# Patient Record
Sex: Female | Born: 1935 | Race: White | Hispanic: No | Smoking: Never smoker
Health system: Southern US, Community
[De-identification: ages and names within clinical notes are randomized; demographics above are authoritative.]

## PROBLEM LIST (undated history)

## (undated) DIAGNOSIS — E785 Hyperlipidemia, unspecified: Secondary | ICD-10-CM

## (undated) DIAGNOSIS — I482 Chronic atrial fibrillation, unspecified: Secondary | ICD-10-CM

## (undated) DIAGNOSIS — I509 Heart failure, unspecified: Secondary | ICD-10-CM

## (undated) DIAGNOSIS — Z9289 Personal history of other medical treatment: Secondary | ICD-10-CM

## (undated) DIAGNOSIS — N289 Disorder of kidney and ureter, unspecified: Secondary | ICD-10-CM

## (undated) DIAGNOSIS — I639 Cerebral infarction, unspecified: Secondary | ICD-10-CM

## (undated) DIAGNOSIS — M1990 Unspecified osteoarthritis, unspecified site: Secondary | ICD-10-CM

## (undated) DIAGNOSIS — I10 Essential (primary) hypertension: Secondary | ICD-10-CM

## (undated) HISTORY — PX: TONSILLECTOMY: SUR1361

## (undated) HISTORY — DX: Chronic atrial fibrillation, unspecified: I48.20

## (undated) HISTORY — DX: Personal history of other medical treatment: Z92.89

## (undated) HISTORY — DX: Disorder of kidney and ureter, unspecified: N28.9

## (undated) HISTORY — PX: CATARACT EXTRACTION W/ INTRAOCULAR LENS IMPLANT: SHX1309

---

## 1998-06-13 ENCOUNTER — Other Ambulatory Visit: Admission: RE | Admit: 1998-06-13 | Discharge: 1998-06-13 | Payer: Self-pay | Admitting: Internal Medicine

## 1999-06-20 ENCOUNTER — Encounter: Payer: Self-pay | Admitting: Neurology

## 1999-06-20 ENCOUNTER — Inpatient Hospital Stay (HOSPITAL_COMMUNITY): Admission: EM | Admit: 1999-06-20 | Discharge: 1999-06-21 | Payer: Self-pay | Admitting: Emergency Medicine

## 1999-06-20 ENCOUNTER — Encounter: Payer: Self-pay | Admitting: Emergency Medicine

## 2000-08-20 ENCOUNTER — Other Ambulatory Visit: Admission: RE | Admit: 2000-08-20 | Discharge: 2000-08-20 | Payer: Self-pay | Admitting: *Deleted

## 2004-06-08 ENCOUNTER — Ambulatory Visit (HOSPITAL_COMMUNITY): Admission: RE | Admit: 2004-06-08 | Discharge: 2004-06-08 | Payer: Self-pay | Admitting: Gastroenterology

## 2004-06-12 ENCOUNTER — Ambulatory Visit (HOSPITAL_COMMUNITY): Admission: RE | Admit: 2004-06-12 | Discharge: 2004-06-12 | Payer: Self-pay | Admitting: Gastroenterology

## 2009-09-20 DIAGNOSIS — Z9289 Personal history of other medical treatment: Secondary | ICD-10-CM

## 2009-09-20 HISTORY — DX: Personal history of other medical treatment: Z92.89

## 2012-08-26 ENCOUNTER — Observation Stay (HOSPITAL_COMMUNITY)
Admission: EM | Admit: 2012-08-26 | Discharge: 2012-08-27 | Disposition: A | Discharge status: Home / Self Care | Payer: Medicare PPO | Attending: Cardiology | Admitting: Cardiology

## 2012-08-26 ENCOUNTER — Encounter (HOSPITAL_COMMUNITY): Payer: Self-pay | Admitting: Cardiology

## 2012-08-26 ENCOUNTER — Emergency Department (HOSPITAL_COMMUNITY): Payer: Medicare PPO

## 2012-08-26 DIAGNOSIS — I200 Unstable angina: Secondary | ICD-10-CM

## 2012-08-26 DIAGNOSIS — Z7901 Long term (current) use of anticoagulants: Secondary | ICD-10-CM

## 2012-08-26 DIAGNOSIS — R079 Chest pain, unspecified: Secondary | ICD-10-CM | POA: Diagnosis present

## 2012-08-26 DIAGNOSIS — R9439 Abnormal result of other cardiovascular function study: Secondary | ICD-10-CM | POA: Diagnosis present

## 2012-08-26 DIAGNOSIS — R0789 Other chest pain: Principal | ICD-10-CM | POA: Insufficient documentation

## 2012-08-26 DIAGNOSIS — I10 Essential (primary) hypertension: Secondary | ICD-10-CM | POA: Diagnosis present

## 2012-08-26 DIAGNOSIS — E785 Hyperlipidemia, unspecified: Secondary | ICD-10-CM | POA: Insufficient documentation

## 2012-08-26 DIAGNOSIS — K219 Gastro-esophageal reflux disease without esophagitis: Secondary | ICD-10-CM | POA: Diagnosis present

## 2012-08-26 DIAGNOSIS — I4891 Unspecified atrial fibrillation: Secondary | ICD-10-CM | POA: Insufficient documentation

## 2012-08-26 DIAGNOSIS — I2510 Atherosclerotic heart disease of native coronary artery without angina pectoris: Secondary | ICD-10-CM | POA: Insufficient documentation

## 2012-08-26 HISTORY — DX: Essential (primary) hypertension: I10

## 2012-08-26 HISTORY — DX: Hyperlipidemia, unspecified: E78.5

## 2012-08-26 LAB — PROTIME-INR
INR: 1.93 — ABNORMAL HIGH (ref 0.00–1.49)
Prothrombin Time: 21.3 seconds — ABNORMAL HIGH (ref 11.6–15.2)

## 2012-08-26 LAB — POCT I-STAT TROPONIN I: Troponin i, poc: 0 ng/mL (ref 0.00–0.08)

## 2012-08-26 LAB — CBC
Platelets: 299 10*3/uL (ref 150–400)
RBC: 4.1 MIL/uL (ref 3.87–5.11)
RDW: 14.2 % (ref 11.5–15.5)
WBC: 6.5 10*3/uL (ref 4.0–10.5)

## 2012-08-26 LAB — BASIC METABOLIC PANEL
Chloride: 95 mEq/L — ABNORMAL LOW (ref 96–112)
GFR calc Af Amer: >90 mL/min (ref >90)
Potassium: 3.8 mEq/L (ref 3.5–5.1)
Sodium: 133 mEq/L — ABNORMAL LOW (ref 135–145)

## 2012-08-26 LAB — TROPONIN I: Troponin I: <0.3 ng/mL (ref <0.30)

## 2012-08-26 MED ORDER — NITROGLYCERIN 2 % TD OINT: 0.5000 [in_us] | TOPICAL_OINTMENT | Freq: Four times a day (QID) | TRANSDERMAL | Status: DC

## 2012-08-26 MED ORDER — VITAMIN D3 25 MCG (1000 UNIT) PO TABS
1000.0000 [IU] | ORAL_TABLET | Freq: Two times a day (BID) | ORAL | Status: DC
  Administered 2012-08-27: 1000 [IU] via ORAL
  Filled 2012-08-26 (×3): qty 1

## 2012-08-26 MED ORDER — NITROGLYCERIN 0.4 MG SL SUBL: 0.4000 mg | SUBLINGUAL_TABLET | SUBLINGUAL | Status: DC | PRN

## 2012-08-26 MED ORDER — WARFARIN - PHARMACIST DOSING INPATIENT: Freq: Every day | Status: DC

## 2012-08-26 MED ORDER — ONDANSETRON HCL 4 MG/2ML IJ SOLN: 4.0000 mg | Freq: Three times a day (TID) | INTRAMUSCULAR | Status: DC | PRN

## 2012-08-26 MED ORDER — SODIUM CHLORIDE 0.9 % IV SOLN: 250.0000 mL | INTRAVENOUS | Status: DC | PRN

## 2012-08-26 MED ORDER — METOPROLOL TARTRATE 25 MG PO TABS
25.0000 mg | ORAL_TABLET | Freq: Every day | ORAL | Status: DC
  Administered 2012-08-27: 25 mg via ORAL
  Filled 2012-08-26: qty 1

## 2012-08-26 MED ORDER — METOPROLOL-HYDROCHLOROTHIAZIDE 50-25 MG PO TABS: 0.5000 | ORAL_TABLET | Freq: Every day | ORAL | Status: DC

## 2012-08-26 MED ORDER — HYDROCODONE-ACETAMINOPHEN 5-325 MG PO TABS: 1.0000 | ORAL_TABLET | ORAL | Status: DC | PRN

## 2012-08-26 MED ORDER — HYDROCHLOROTHIAZIDE 12.5 MG PO CAPS
12.5000 mg | ORAL_CAPSULE | Freq: Every day | ORAL | Status: DC
  Administered 2012-08-27: 12.5 mg via ORAL
  Filled 2012-08-26: qty 1

## 2012-08-26 MED ORDER — PANTOPRAZOLE SODIUM 40 MG PO TBEC
40.0000 mg | DELAYED_RELEASE_TABLET | Freq: Every day | ORAL | Status: DC
  Administered 2012-08-27: 40 mg via ORAL
  Filled 2012-08-26: qty 1

## 2012-08-26 MED ORDER — ONDANSETRON HCL 4 MG PO TABS: 4.0000 mg | ORAL_TABLET | Freq: Four times a day (QID) | ORAL | Status: DC | PRN

## 2012-08-26 MED ORDER — SODIUM CHLORIDE 0.9 % IJ SOLN: 3.0000 mL | INTRAMUSCULAR | Status: DC | PRN

## 2012-08-26 MED ORDER — ONDANSETRON HCL 4 MG/2ML IJ SOLN: 4.0000 mg | Freq: Four times a day (QID) | INTRAMUSCULAR | Status: DC | PRN

## 2012-08-26 MED ORDER — MORPHINE SULFATE 2 MG/ML IJ SOLN: 2.0000 mg | INTRAMUSCULAR | Status: DC | PRN

## 2012-08-26 MED ORDER — MAGNESIUM CHLORIDE 64 MG PO TBEC
1.0000 | DELAYED_RELEASE_TABLET | Freq: Two times a day (BID) | ORAL | Status: DC
  Administered 2012-08-27 (×2): 64 mg via ORAL
  Filled 2012-08-26 (×3): qty 1

## 2012-08-26 MED ORDER — SODIUM CHLORIDE 0.9 % IJ SOLN
3.0000 mL | Freq: Two times a day (BID) | INTRAMUSCULAR | Status: DC
  Administered 2012-08-27 (×2): 3 mL via INTRAVENOUS

## 2012-08-26 MED ORDER — CALCIUM CARBONATE 1250 (500 CA) MG PO TABS
1.0000 | ORAL_TABLET | Freq: Two times a day (BID) | ORAL | Status: DC
  Administered 2012-08-27: 500 mg via ORAL
  Filled 2012-08-26 (×3): qty 1

## 2012-08-26 MED ORDER — WARFARIN SODIUM 5 MG PO TABS
5.0000 mg | ORAL_TABLET | Freq: Once | ORAL | Status: AC
  Administered 2012-08-27: 5 mg via ORAL
  Filled 2012-08-26: qty 1

## 2012-08-26 NOTE — ED Notes (Signed)
Hospitalist in room with pt. 

## 2012-08-26 NOTE — ED Provider Notes (Signed)
History     CSN: [id number]  Arrival date & time 08/26/12  1555   First MD Initiated Contact with Patient 08/26/12 1930      Chief Complaint  Patient presents with  . Chest Pain     HPI Pt reports left sided chest pain that started around 1300 this afternoon. Denies any n/v or SOB with the pain. States she just had a check up at her PCP office on Friday and was cleared.   Past Medical History  Diagnosis Date  . Hypertension   . Hyperlipemia   . Arrhythmia     Past Surgical History  Procedure Laterality Date  . Tonsillectomy      History reviewed. No pertinent family history.  History  Substance Use Topics  . Smoking status: Never Smoker   . Smokeless tobacco: Not on file  . Alcohol Use: No    OB History   Grav Para Term Preterm Abortions TAB SAB Ect Mult Living                  Review of Systems All other systems reviewed and are negative Allergies  Latex  Home Medications   Current Outpatient Rx  Name  Route  Sig  Dispense  Refill  . calcium carbonate (OS-CAL - DOSED IN MG OF ELEMENTAL CALCIUM) 1250 MG tablet   Oral   Take 1 tablet by mouth 2 (two) times daily.         . cholecalciferol (VITAMIN D) 1000 UNITS tablet   Oral   Take 1,000 Units by mouth 2 (two) times daily.         . fish oil-omega-3 fatty acids 1000 MG capsule   Oral   Take 1 g by mouth every morning.         . magnesium chloride (SLOW-MAG) 64 MG TBEC   Oral   Take 1 tablet by mouth 2 (two) times daily.         . metoprolol-hydrochlorothiazide (LOPRESSOR HCT) 50-25 MG per tablet   Oral   Take 0.5 tablets by mouth daily.         . Multiple Vitamins-Minerals (PRESERVISION AREDS) TABS   Oral   Take 1 tablet by mouth daily.         Marland Kitchen omeprazole (PRILOSEC) 20 MG capsule   Oral   Take 20 mg by mouth daily.         Marland Kitchen warfarin (COUMADIN) 5 MG tablet   Oral   Take 2.5-5 mg by mouth daily. 0.5 tablet 6 days a week; 1 tablet on Monday           BP 149/76   Pulse 89  Temp(Src) 98 F (36.7 C) (Oral)  Resp 16  SpO2 99%  Physical Exam  Nursing note and vitals reviewed. Constitutional: She is oriented to person, place, and time. She appears well-developed and well-nourished. No distress.  HENT:  Head: Normocephalic and atraumatic.  Eyes: Pupils are equal, round, and reactive to light.  Neck: Normal range of motion.  Cardiovascular: Normal rate and intact distal pulses.  An irregular rhythm present.   Date: 08/26/2012  Rate: 83  Rhythm: Atrial fibrillation  QRS Axis: normal  Intervals: normal  ST/T Wave abnormalities: normal  Conduction Disutrbances: none  Narrative Interpretation: Abnormal EKG      Pulmonary/Chest: No respiratory distress.  Abdominal: Normal appearance. She exhibits no distension. There is no tenderness. There is no rebound.  Musculoskeletal: Normal range of motion.  Neurological: She is alert  and oriented to person, place, and time. No cranial nerve deficit.  Skin: Skin is warm and dry. No rash noted.  Psychiatric: She has a normal mood and affect. Her behavior is normal.    ED Course  Procedures (including critical care time) Her cardiologist was consulted.  Requested patient be admitted to internal medicine.  CRITICAL CARE Performed by: Nelia Shi   Total critical care time: 30 min   Critical care time was exclusive of separately billable procedures and treating other patients.  Critical care was necessary to treat or prevent imminent or life-threatening deterioration.  Critical care was time spent personally by me on the following activities: development of treatment plan with patient and/or surrogate as well as nursing, discussions with consultants, evaluation of patient's response to treatment, examination of patient, obtaining history from patient or surrogate, ordering and performing treatments and interventions, ordering and review of laboratory studies, ordering and review of radiographic  studies, pulse oximetry and re-evaluation of patient's condition.  Labs Reviewed  CBC - Abnormal; Notable for the following:    Hemoglobin 11.8 (*)    HCT 34.9 (*)    All other components within normal limits  BASIC METABOLIC PANEL - Abnormal; Notable for the following:    Sodium 133 (*)    Chloride 95 (*)    Glucose, Bld 101 (*)    GFR calc non Af Amer 83 (*)    All other components within normal limits  PROTIME-INR  POCT I-STAT TROPONIN I   Dg Chest 2 View  08/26/2012  *RADIOLOGY REPORT*  Clinical Data: Chest pain.  CHEST - 2 VIEW  Comparison: None.  Findings: There is evidence of probable COPD.  No infiltrate, pneumothorax, consolidation, nodule or pleural fluid is seen. Heart size is within normal limits.  Bony thorax shows osteopenia as well as significant scoliosis of the thoracolumbar spine.  IMPRESSION: Probable COPD.  No acute findings.   Original Report Authenticated By: Irish Lack, M.D.      1. Unstable angina       MDM          Nelia Shi, MD 08/26/12 2056

## 2012-08-26 NOTE — Progress Notes (Signed)
ANTICOAGULATION CONSULT NOTE - Initial Consult  Pharmacy Consult for Coumadin Indication: atrial fibrillation  Allergies  Allergen Reactions  . Latex Itching    Patient Measurements: Height: 5\' 1"  (154.9 cm) Weight: 109 lb 12.6 oz (49.8 kg) IBW/kg (Calculated) : 47.8  Vital Signs: Temp: 97.9 F (36.6 C) (02/25 2218) Temp src: Oral (02/25 2218) BP: 144/84 mmHg (02/25 2218) Pulse Rate: 88 (02/25 2218)  Labs:  Recent Labs  08/26/12 1605 08/26/12 1956  HGB 11.8*  --   HCT 34.9*  --   PLT 299  --   LABPROT  --  21.3*  INR  --  1.93*  CREATININE 0.56  --     Estimated Creatinine Clearance: 38.8 ml/min (by C-G formula based on Cr of 0.56).   Medical History: Past Medical History  Diagnosis Date  . Hypertension   . Hyperlipemia   . Arrhythmia     Medications:  Prescriptions prior to admission  Medication Sig Dispense Refill  . calcium carbonate (OS-CAL - DOSED IN MG OF ELEMENTAL CALCIUM) 1250 MG tablet Take 1 tablet by mouth 2 (two) times daily.      . cholecalciferol (VITAMIN D) 1000 UNITS tablet Take 1,000 Units by mouth 2 (two) times daily.      . fish oil-omega-3 fatty acids 1000 MG capsule Take 1 g by mouth every morning.      . magnesium chloride (SLOW-MAG) 64 MG TBEC Take 1 tablet by mouth 2 (two) times daily.      . metoprolol-hydrochlorothiazide (LOPRESSOR HCT) 50-25 MG per tablet Take 0.5 tablets by mouth daily.      . Multiple Vitamins-Minerals (PRESERVISION AREDS) TABS Take 1 tablet by mouth daily.      Marland Kitchen omeprazole (PRILOSEC) 20 MG capsule Take 20 mg by mouth daily.      Marland Kitchen warfarin (COUMADIN) 5 MG tablet Take 2.5-5 mg by mouth daily. 0.5 tablet 6 days a week; 1 tablet on Monday        Assessment: 79 yo female admitted with chest pain, h/o Afib, to continue anticoagulation  Goal of Therapy:  INR 2-3 Monitor platelets by anticoagulation protocol: Yes   Plan:  Coumadin 5 mg tonight Daily INR  Maxxwell Edgett, Gary Fleet 08/26/2012,11:18 PM

## 2012-08-26 NOTE — H&P (Signed)
[facility] hospitalist  Date: 08/26/2012               Patient Name:  Diamond Thomas MRN: [id number]  DOB: 1925/05/09 Age / Sex: 79 y.o., female   PCP: No primary provider on file.                Chief Complaint: Chest pain  History of Present Illness: Patient is a 79 y.o. female with a PMHx of atrial fibrillation, hypertension, hyperlipidemia, coronary artery disease and reflux disease, who presents to [facility] for evaluation of acute onset chest pain that began 3 hours prior to presenting to [facility]. Patient was resting at that time. Pain is described as a sharp chest pain located over mid chest. The pain is rated as a 5/10 in severity at onset and currently rated 0/10. The pain is notably aggravated by nothing. It is alleviated by none. denies associated diaphoresis, nausea, near syncope, palpitations, shortness of breath and syncope. For the pain, the patient tried nothing for pain relief.  The patient has not experienced similar symptoms previously. Secondary to her constellation of symptoms, Diamond Thomas presented to [facility] for further evaluation. Otherwise, the patient denies diaphoresis, nausea, near syncope, palpitations, shortness of breath and syncope.   Current Outpatient Medications:  (Not in a hospital admission)  Allergies: Allergies  Allergen Reactions  . Latex Itching     Past Medical History: Past Medical History  Diagnosis Date  . Hypertension   . Hyperlipemia   . Arrhythmia     Past Surgical History: Past Surgical History  Procedure Laterality Date  . Tonsillectomy      Family History: History reviewed. No pertinent family history.  Social History: History   Social History  . Marital Status: Married    Spouse Name: N/A    Number of Children: N/A  . Years of Education: N/A   Occupational History  . Not on file.   Social History Main Topics  . Smoking status: Never Smoker   . Smokeless tobacco: Not on file  . Alcohol Use: No  . Drug Use: No  .  Sexually Active: Not on file   Other Topics Concern  . Not on file   Social History Narrative  . No narrative on file    Review of Systems: Constitutional:  denies fever, chills, diaphoresis, appetite change and fatigue.  HEENT: denies photophobia, eye pain, redness, hearing loss, ear pain, congestion, sore throat, rhinorrhea, sneezing, neck pain, neck stiffness and tinnitus.  Respiratory: denies SOB, DOE, cough, chest tightness, and wheezing.  Cardiovascular: denies chest pain, palpitations and leg swelling.  Gastrointestinal: denies nausea, vomiting, abdominal pain, diarrhea, constipation, blood in stool.  Genitourinary: denies dysuria, urgency, frequency, hematuria, flank pain and difficulty urinating.  Musculoskeletal: denies  myalgias, back pain, joint swelling, arthralgias and gait problem.   Skin: denies pallor, rash and wound.  Neurological: denies dizziness, seizures, syncope, weakness, light-headedness, numbness and headaches.   Hematological: denies adenopathy, easy bruising, personal or family bleeding history.  Psychiatric/ Behavioral: denies suicidal ideation, mood changes, confusion, nervousness, sleep disturbance and agitation.    Vital Signs: Blood pressure 128/66, pulse 90, temperature 98 F (36.7 C), temperature source Oral, resp. rate 19, SpO2 96.00%.  Physical Exam: General: Vital signs reviewed and noted. Well-developed, well-nourished, in no acute distress; alert, appropriate and cooperative throughout examination.  Head: Normocephalic, atraumatic.  Eyes: PERRL, EOMI, No signs of anemia or jaundince.  Nose: Mucous membranes moist, not inflammed, nonerythematous.  Throat: Oropharynx nonerythematous, no exudate appreciated.  Neck: No deformities, masses, or tenderness noted.Supple, No carotid Bruits, no JVD.  Lungs:  Normal respiratory effort. Clear to auscultation BL without crackles or wheezes.  Heart:  irregularly irregular rhythm. S1 and S2 normal without  gallop, murmur, or rubs.  Abdomen:  BS normoactive. Soft, Nondistended, non-tender.  No masses or organomegaly.  Extremities: No pretibial edema.  Neurologic: A&O X3, CN II - XII are grossly intact. Motor strength is 5/5 in the all 4 extremities, Sensations intact to light touch, Cerebellar signs negative.  Skin: No visible rashes, scars.    Lab results: Basic Metabolic Panel:  Recent Labs  08/65/78 1605  NA 133*  K 3.8  CL 95*  CO2 29  GLUCOSE 101*  BUN 19  CREATININE 0.56  CALCIUM 9.5   CBC:  Recent Labs  08/26/12 1605  WBC 6.5  HGB 11.8*  HCT 34.9*  MCV 85.1  PLT 299   Coagulation:  Recent Labs  08/26/12 1956  LABPROT 21.3*  INR 1.93*    Misc. Labs: Negative troponins so far   Imaging results:  Dg Chest 2 View  08/26/2012  *RADIOLOGY REPORT*  Clinical Data: Chest pain.  CHEST - 2 VIEW  Comparison: None.  Findings: There is evidence of probable COPD.  No infiltrate, pneumothorax, consolidation, nodule or pleural fluid is seen. Heart size is within normal limits.  Bony thorax shows osteopenia as well as significant scoliosis of the thoracolumbar spine.  IMPRESSION: Probable COPD.  No acute findings.   Original Report Authenticated By: Irish Lack, M.D.      Other results:  EKG (08/26/2012) - Atrial Fibrillation, irregular rate of approximately 83 bpm, normal axis, ST segments: normal.    Last Echo not present in the chart   Last Cath not present in the chart    Assessment & Plan: Pt is a 79 y.o. yo female with a PMHX of hypertension, hyperlipidemia, atrial fibrillation and reflux disease, who presented to [facility] on 08/26/2012 with symptoms of mid chest chest pain, with EKG showing irregularly irregular rhythm and initial troponins of 0.00.    Atypical chest pain -the pain was persistent for about 3 hours prior to patient being presenting to ER, no exacerbating or relieving factors and disappeared with minimal therapy. Pain is atypical in character  but given her previous coronary artery disease there is a risk of this being cardiac in origin.    Admission EKG showed atrial fibrillation, and cardiac enzymes have been negative so for. atheterization has been performed in the past but there are no records in Epic. Dr. Herbie Baltimore who is the primary cardiologist was informed by ER who suggested a hospitalist admission and he agreed to see the patient in the morning.   Admit to telemetry  Continue to cycle cardiac enzymes.  Continue aspirin, beta blocker, statin therapy.  Continue morphine, oxygen, PRN nitroglycerin.  Continue treatment with her regular medications for other medical problems including Coumadin per pharmacy. Patient's atrial fibrillation is rate controlled at this time and I will continue beta blocker.    DVT PPX - full dose Coumadin CODE STATUS - full   Signed: Lars Mage, MD  Pager [phone]  08/26/2012, 9:45 PM   Open [email or web address] for coverage details

## 2012-08-26 NOTE — ED Notes (Signed)
Paged Dr. Herbie Baltimore to [phone]

## 2012-08-26 NOTE — ED Notes (Signed)
Pt reports left sided chest pain that started around 1300 this afternoon. Denies any n/v or SOB with the pain. States she just had a check up at her PCP office on Friday and was cleared.

## 2012-08-27 ENCOUNTER — Encounter (HOSPITAL_COMMUNITY): Payer: Self-pay | Admitting: *Deleted

## 2012-08-27 DIAGNOSIS — Z7901 Long term (current) use of anticoagulants: Secondary | ICD-10-CM

## 2012-08-27 LAB — CBC
HCT: 34.2 % — ABNORMAL LOW (ref 36.0–46.0)
MCHC: 33.6 g/dL (ref 30.0–36.0)
RDW: 14.2 % (ref 11.5–15.5)

## 2012-08-27 LAB — BASIC METABOLIC PANEL
BUN: 17 mg/dL (ref 6–23)
GFR calc Af Amer: >90 mL/min (ref >90)
GFR calc non Af Amer: 83 mL/min — ABNORMAL LOW (ref >90)
Potassium: 3.9 mEq/L (ref 3.5–5.1)

## 2012-08-27 LAB — TROPONIN I
Troponin I: <0.3 ng/mL (ref <0.30)
Troponin I: <0.3 ng/mL (ref <0.30)

## 2012-08-27 MED ORDER — WARFARIN SODIUM 2.5 MG PO TABS
2.5000 mg | ORAL_TABLET | Freq: Once | ORAL | Status: DC
  Filled 2012-08-27: qty 1

## 2012-08-27 MED ORDER — NITROGLYCERIN 0.4 MG SL SUBL: 0.4000 mg | SUBLINGUAL_TABLET | SUBLINGUAL | Status: DC | PRN

## 2012-08-27 MED ORDER — ACETAMINOPHEN 325 MG PO TABS
650.0000 mg | ORAL_TABLET | Freq: Four times a day (QID) | ORAL | Status: DC | PRN
  Administered 2012-08-27: 650 mg via ORAL
  Filled 2012-08-27: qty 2

## 2012-08-27 NOTE — H&P (Signed)
I have seen and evaluated the patient this AM along with Boyce Medici, PA. I agree with her findings & examination as well as impression recommendations.  The patient is well-known to me from recent clinic visit. A pleasant 79 year old woman with a history of abnormal Myoview in 2011 does never had any symptoms of angina since. I think the stress test was done to evaluate cause of A. Fib which is also not had much of since 2011. In the past she's been very reluctant to have any invasive evaluations done, and I have been in agreement with this strategy.  She comes in after having a prolonged episode of chest discomfort which she described as sharp stabbing pain on the left side of her chest radiating out toward the shoulder. It occurred shortly after eating lunch. It pretty much lasted throughout the evening, was not exacerbated by mopping her floor vigorously for 30 minutes. It sort of was there is some worsening moments but usually just there. Symptoms vitamins been gone since about 2:00 in the morning. Has not had any further symptoms since. So far she's ruled out with 2 sets of cardiac cardiac enzymes negative, final levels pending.  She is on warfarin for atrial fibrillation her INR is just barely subtherapeutic,, still too high to consider an invasive procedure at this time. Her exam is essentially benign with the exception of having some reproducible discomfort left costal margin. Place this the scope of this area elicited a sharp pain which was similar to her pain yesterday.  My overall assessment of the situation as it exists discomfort episode was very unlikely cardiac in nature for several reasons: The duration of time that lasted, the nature the pain being sharp and stabbing, and the fact it is not exacerbated by her mopping the floor for 30 minutes. She had no associated symptoms of nausea diaphoresis or shortness of breath. He is currently resting comfortably. She would like to go home  today  My plan for today is to have her angulated in the hall with nursing to see if she has any reproducible pain similarly yesterday. I then want to monitor her after having lunch as this is the timeframe of when she had her symptom yesterday. We'll then ambulate her againlater in the afternoon.  If she remains stable throughout this process, my clinic to be to discharge her home with sublingual nitroglycerin glycerin when necessary but continue her current medications. Overall suspicion is that this is probably muscular skeletal chest pain the reproducibility of her chest discomfort with palpation. Would recommend to simply use of Tylenol for this.  I would like her to follow back up again with one of our transportation is in a couple weeks to make sure ensurerecurrence of symptoms. As she has not been in favor of the invasive evaluation in the past, repeating a stress test as not likely to help anything because of previous Racz abnormal in the absence of no symptoms. I am in favor of a less is best approach which has been hers and her family's intention since I have known her.  Marykay Lex, M.D., M.S. [facility] [address] [place], [place]  [address]  [phone] Pager # [phone] 08/27/2012 11:29 AM

## 2012-08-27 NOTE — Discharge Summary (Signed)
Physician Discharge Summary  Patient ID: Diamond Thomas MRN: [id number] DOB/AGE: 02/17/1925 79 y.o.  Admit date: 08/26/2012 Discharge date: 08/27/2012  Admission Diagnoses: Chest Pain  Discharge Diagnoses:  Principal Problem:   Chest pain at rest Active Problems:   Hypertension   HLD (hyperlipidemia)   Coronary artery disease - High risk NST 08/2009, pt refused diagnostic heart cath, EF >55% by echo 08/2009   Atrial fibrillation - rate controlled    Acid reflux disease   Chronic anticoagulation - with warfarin   Discharged Condition: stable  Hospital Course: The patient is an 79 y/o female who was admitted on 2/25, by [facility], for chest pain. Her primary cardiologist is Dr. Herbie Baltimore. Her history is significant for longstanding atrial fibrillation, rate controlled on metoprolol and anticoagulated with warfarin. Her warfarin is being followed by Dr. Nicholos Johns. She underwent nuclear stress testing in March of 2011 and had a high risk scan. There was a medium sized moderate intensity perfusion defect in the basal inferior-mid inferior wall with moderate reversibility. The patient however had been asymptomatic and did not wish to pursue coronary angiography or intervention at that time. An Echo in March 2011 showed an EF greater than 55% with normal wall motion, bilateral mid left atrial dilation and mild aortic sclerosis. She had last seen Dr. Herbie Baltimore in clinic for routine follow-up recently on 08/22/12, and at that time had no issues or complaints. She had presented to [facility] with a complaint of atypical chest pain and was admitted for cardiac evaluation. She ruled out for MI with negative troponin's x 3. Serial EKGs showed atrial fibrillation, that was rate controlled, but showed no acute changes. A CXR was also obtained and was normal.  On physical examination, the patient had reproducible chest pain with palpation of the chest wall.  Essentially, her chest pain had resolved with no medical  therapy, over the course of several hours. Dr. Herbie Baltimore saw her in consult and felt that her pain was not cardiac related and perhaps musculoskeletal. However, the patient was kept and monitored for several additional hours. She was ordered to ambulate with nursing assistance to see if there was any recurrence of symptoms. The patient denied further chest pain. Dr Herbie Baltimore, felt that she was stable for discharge home. She will continue with her home medical regimen. She was given a prescription for PRN SL NTG. Follow-up has been arranged with Nada Boozer, Advanced Practitioner , on 09/10/12 at [facility].   Consults: None  Significant Diagnostic Studies:   Serial EKGs: Atrial fibrillation, Vent. Rate in the 90s, no acute changes    Cardiac Panel (last 3 results)  Recent Labs  08/26/12 2304 08/27/12 0430 08/27/12 1200  TROPONINI <0.30 <0.30 <0.30    CXR: *RADIOLOGY REPORT*  Clinical Data: Chest pain.  CHEST - 2 VIEW  Comparison: None.  Findings: There is evidence of probable COPD. No infiltrate,  pneumothorax, consolidation, nodule or pleural fluid is seen.  Heart size is within normal limits. Bony thorax shows osteopenia  as well as significant scoliosis of the thoracolumbar spine.  IMPRESSION:  Probable COPD. No acute findings.  Original Report Authenticated By: Irish Lack, M.D.     Treatments: See Hospital Course  Discharge Exam: Blood pressure 108/59, pulse 87, temperature 98.1 F (36.7 C), temperature source Oral, resp. rate 17, height 5\' 1"  (1.549 m), weight 109 lb 12.6 oz (49.8 kg), SpO2 98.00%.   Disposition:   Discharge Orders   Future Orders Complete By Expires     Diet -  low sodium heart healthy  As directed     Increase activity slowly  As directed         Medication List    TAKE these medications       calcium carbonate 1250 MG tablet  Commonly known as:  OS-CAL - dosed in mg of elemental calcium  Take 1 tablet by mouth 2 (two) times daily.      cholecalciferol 1000 UNITS tablet  Commonly known as:  VITAMIN D  Take 1,000 Units by mouth 2 (two) times daily.     fish oil-omega-3 fatty acids 1000 MG capsule  Take 1 g by mouth every morning.     magnesium chloride 64 MG Tbec  Commonly known as:  SLOW-MAG  Take 1 tablet by mouth 2 (two) times daily.     metoprolol-hydrochlorothiazide 50-25 MG per tablet  Commonly known as:  LOPRESSOR HCT  Take 0.5 tablets by mouth daily.     nitroGLYCERIN 0.4 MG SL tablet  Commonly known as:  NITROSTAT  Place 1 tablet (0.4 mg total) under the tongue every 5 (five) minutes as needed for chest pain.     omeprazole 20 MG capsule  Commonly known as:  PRILOSEC  Take 20 mg by mouth daily.     PRESERVISION AREDS Tabs  Take 1 tablet by mouth daily.     warfarin 5 MG tablet  Commonly known as:  COUMADIN  Take 2.5-5 mg by mouth daily. 0.5 tablet 6 days a week; 1 tablet on Monday           Follow-up Information   Follow up with [facility] R, NP On 09/10/2012. (10:00 am)    Contact information:   [address] [place] [address] [phone]       Signed: Allayne Butcher, PA-C 08/27/2012, 2:08 PM

## 2012-08-27 NOTE — Care Management Note (Signed)
    Page 1 of 1   08/27/2012     3:23:20 PM   CARE MANAGEMENT NOTE 08/27/2012  Patient:  TANEAL, SONNTAG   Account Number:  [id number]  Date Initiated:  08/27/2012  Documentation initiated by:  Latiffany Harwick  Subjective/Objective Assessment:   PT ADM WITH CHEST PAIN ON 08/26/12.  HX OF SEV FALLS OVER THE PAST YEAR.  PTA, PT LIVES AT HOME ALONE AND IS INDEPENDENT.  SHE AMBULATES WITH A CANE.     Action/Plan:   MET WITH PT TO DISCUSS DC NEEDS.  PT STATES SON AND DAUGHTER ASSIST AS NEEDED.  SHE DECLINED MY OFFER OF HHPT SAFTEY EVAL AT HOME DUE TO HX OF FALLS.  SHE HAS RW AND 3 PRONGED CANE AT HOME.   Anticipated DC Date:  08/27/2012   Anticipated DC Plan:  HOME/SELF CARE      DC Planning Services  CM consult      Choice offered to / List presented to:             Status of service:  Completed, signed off Medicare Important Message given?   (If response is "NO", the following Medicare IM given date fields will be blank) Date Medicare IM given:   Date Additional Medicare IM given:    Discharge Disposition:  HOME/SELF CARE  Per UR Regulation:  Reviewed for med. necessity/level of care/duration of stay  If discussed at Long Length of Stay Meetings, dates discussed:    Comments:

## 2012-08-27 NOTE — Progress Notes (Signed)
Utilization Review Completed.Heru Montz T2/26/2014  

## 2012-08-27 NOTE — Progress Notes (Signed)
ANTICOAGULATION CONSULT NOTE - Follow Up Consult  Pharmacy Consult for Coumadin Indication: atrial fibrillation  Allergies  Allergen Reactions  . Latex Itching    Patient Measurements: Height: 5\' 1"  (154.9 cm) Weight: 109 lb 12.6 oz (49.8 kg) IBW/kg (Calculated) : 47.8  Vital Signs: Temp: 98.1 F (36.7 C) (02/26 0342) Temp src: Oral (02/26 0342) BP: 108/59 mmHg (02/26 1020) Pulse Rate: 87 (02/26 1020)  Labs:  Recent Labs  08/26/12 1605 08/26/12 1956 08/26/12 2304 08/27/12 0430  HGB 11.8*  --   --  11.5*  HCT 34.9*  --   --  34.2*  PLT 299  --   --  291  LABPROT  --  21.3*  --  21.0*  INR  --  1.93*  --  1.89*  CREATININE 0.56  --   --  0.56  TROPONINI  --   --  <0.30 <0.30    Estimated Creatinine Clearance: 38.8 ml/min (by C-G formula based on Cr of 0.56).  Assessment:   INR just below target range.   Home Coumadin regimen:  2.5 mg daily except 5 mg on Mondays.  (Has 5 mg tablets.)   2/25 dose not taken before coming to ED, given at 1am today. Dose was increased to 5 mg x 1 due to low INR.  May go home later today.  She thinks her usual INR check is due next week.  Goal of Therapy:  INR 2-3 Monitor platelets by anticoagulation protocol: Yes   Plan:   Coumadin 2.5 mg tonight.  PT/INR in am if not discharged.  Dennie Fetters, RPh Pager: [phone] 08/27/2012,12:27 PM

## 2012-08-27 NOTE — H&P (Signed)
Reason for Consult:Chest Pain Referring Physician: Lars Mage, MD ([facility]) Primary Cardiologist: Dr. Herbie Baltimore   HPI: Diamond Thomas is an 79 y/o female who has been followed at [facility] by Dr. Herbie Baltimore. Diamond Thomas, Diamond controlled on metoprolol and anticoagulated with warfarin. Diamond warfarin is being followed by Dr. Nicholos Johns. She underwent nuclear stress testing in March of 2011 and had a high risk scan. There was a medium sized moderate intensity perfusion defect in the basal inferior-mid inferior wall with moderate reversibility. The patient however had been asymptomatic and did not wish to pursue coronary angiography or intervention at that time. An Echo in March 2011 showed an EF greater than 55% with normal wall motion, bilateral mid left atrial dilation and mild aortic sclerosis. She presented to [facility] on 2/25 with a chief complaint of chest pain.  She reports that Diamond chest pain started 1 day ago after eating lunch, which consisted of a peanut butter sandwich and coffee. She denies any chest pain prior to yesterday's episode. The discomfort started approximately 1 hour after eating. She first thought it was indigestion. She endorses burping, which provided minimal relief. The pain was sharp and substernal. Acute onset at rest and constant. 5/10 maximum intensity. Non-radiating, non-positional, non-pleuritic and non-exertional. No alleviating or aggravating factors. No associated symptoms. She denies SOB, diaphoresis, n/v, palpitations, lightheadedness/dizzinesss, syncope/presyncope. No orthopnea/PND or LEE. She did not take any thing at home for it. After 2 hours, she reports that the discomfort eased slightly but did not fully go way. It was at that point that Diamond son brought Diamond to the ED for evaluation. She was admitted by [facility] and Dr. Herbie Baltimore was notified. Two EKGs have shown no acute changes. She had a normal CXR and  troponins were negative x 2. She has not received any NTG. She currently denies any CP.  Past Medical History  Diagnosis Date  . Hypertension   . Hyperlipemia   . Arrhythmia     Past Surgical History  Procedure Laterality Date  . Tonsillectomy      History reviewed. No pertinent family history.  Social History:  reports that she has never smoked. She does not have any smokeless tobacco history on file. She reports that she does not drink alcohol or use illicit drugs.  Allergies:  Allergies  Allergen Reactions  . Latex Itching    Medications:  Prior to Admission:  Prescriptions prior to admission  Medication Sig Dispense Refill  . calcium carbonate (OS-CAL - DOSED IN MG OF ELEMENTAL CALCIUM) 1250 MG tablet Take 1 tablet by mouth 2 (two) times daily.      . cholecalciferol (VITAMIN D) 1000 UNITS tablet Take 1,000 Units by mouth 2 (two) times daily.      . fish oil-omega-3 fatty acids 1000 MG capsule Take 1 g by mouth every morning.      . magnesium chloride (SLOW-MAG) 64 MG TBEC Take 1 tablet by mouth 2 (two) times daily.      . metoprolol-hydrochlorothiazide (LOPRESSOR HCT) 50-25 MG per tablet Take 0.5 tablets by mouth daily.      . Multiple Vitamins-Minerals (PRESERVISION AREDS) TABS Take 1 tablet by mouth daily.      Marland Kitchen omeprazole (PRILOSEC) 20 MG capsule Take 20 mg by mouth daily.      Marland Kitchen warfarin (COUMADIN) 5 MG tablet Take 2.5-5 mg by mouth daily. 0.5 tablet 6 days a week; 1 tablet on Monday  Results for orders placed during the hospital encounter of 08/26/12 (from the past 48 hour(s))  CBC     Status: Abnormal   Collection Time    08/26/12  4:05 PM      Result Value Range   WBC 6.5  4.0 - 10.5 K/uL   RBC 4.10  3.87 - 5.11 MIL/uL   Hemoglobin 11.8 (*) 12.0 - 15.0 g/dL   HCT 40.9 (*) 81.1 - 91.4 %   MCV 85.1  78.0 - 100.0 fL   MCH 28.8  26.0 - 34.0 pg   MCHC 33.8  30.0 - 36.0 g/dL   RDW 78.2  95.6 - 21.3 %   Platelets 299  150 - 400 K/uL  BASIC METABOLIC  PANEL     Status: Abnormal   Collection Time    08/26/12  4:05 PM      Result Value Range   Sodium 133 (*) 135 - 145 mEq/L   Potassium 3.8  3.5 - 5.1 mEq/L   Chloride 95 (*) 96 - 112 mEq/L   CO2 29  19 - 32 mEq/L   Glucose, Bld 101 (*) 70 - 99 mg/dL   BUN 19  6 - 23 mg/dL   Creatinine, Ser 0.86  0.50 - 1.10 mg/dL   Calcium 9.5  8.4 - 57.8 mg/dL   GFR calc non Af Amer 83 (*) >90 mL/min   GFR calc Af Amer >90  >90 mL/min   Comment:            The eGFR has been calculated     using the CKD EPI equation.     This calculation has not been     validated in all clinical     situations.     eGFR's persistently     <90 mL/min signify     possible Chronic Kidney Disease.  POCT I-STAT TROPONIN I     Status: None   Collection Time    08/26/12  4:23 PM      Result Value Range   Troponin i, poc 0.00  0.00 - 0.08 ng/mL   Comment 3            Comment: Due to the release kinetics of cTnI,     a negative result within the first hours     of the onset of symptoms does not rule out     myocardial infarction with certainty.     If myocardial infarction is still suspected,     repeat the test at appropriate intervals.  PROTIME-INR     Status: Abnormal   Collection Time    08/26/12  7:56 PM      Result Value Range   Prothrombin Time 21.3 (*) 11.6 - 15.2 seconds   INR 1.93 (*) 0.00 - 1.49  TROPONIN I     Status: None   Collection Time    08/26/12 11:04 PM      Result Value Range   Troponin I <0.30  <0.30 ng/mL   Comment:            Due to the release kinetics of cTnI,     a negative result within the first hours     of the onset of symptoms does not rule out     myocardial infarction with certainty.     If myocardial infarction is still suspected,     repeat the test at appropriate intervals.  TROPONIN I     Status: None   Collection  Time    08/27/12  4:30 AM      Result Value Range   Troponin I <0.30  <0.30 ng/mL   Comment:            Due to the release kinetics of cTnI,     a  negative result within the first hours     of the onset of symptoms does not rule out     myocardial infarction with certainty.     If myocardial infarction is still suspected,     repeat the test at appropriate intervals.  CBC     Status: Abnormal   Collection Time    08/27/12  4:30 AM      Result Value Range   WBC 9.0  4.0 - 10.5 K/uL   RBC 4.02  3.87 - 5.11 MIL/uL   Hemoglobin 11.5 (*) 12.0 - 15.0 g/dL   HCT 30.8 (*) 65.7 - 84.6 %   MCV 85.1  78.0 - 100.0 fL   MCH 28.6  26.0 - 34.0 pg   MCHC 33.6  30.0 - 36.0 g/dL   RDW 96.2  95.2 - 84.1 %   Platelets 291  150 - 400 K/uL  BASIC METABOLIC PANEL     Status: Abnormal   Collection Time    08/27/12  4:30 AM      Result Value Range   Sodium 134 (*) 135 - 145 mEq/L   Potassium 3.9  3.5 - 5.1 mEq/L   Chloride 96  96 - 112 mEq/L   CO2 30  19 - 32 mEq/L   Glucose, Bld 88  70 - 99 mg/dL   BUN 17  6 - 23 mg/dL   Creatinine, Ser 3.24  0.50 - 1.10 mg/dL   Calcium 9.7  8.4 - 40.1 mg/dL   GFR calc non Af Amer 83 (*) >90 mL/min   GFR calc Af Amer >90  >90 mL/min   Comment:            The eGFR has been calculated     using the CKD EPI equation.     This calculation has not been     validated in all clinical     situations.     eGFR's persistently     <90 mL/min signify     possible Chronic Kidney Disease.  PROTIME-INR     Status: Abnormal   Collection Time    08/27/12  4:30 AM      Result Value Range   Prothrombin Time 21.0 (*) 11.6 - 15.2 seconds   INR 1.89 (*) 0.00 - 1.49    Dg Chest 2 View  08/26/2012  *RADIOLOGY REPORT*  Clinical Data: Chest pain.  CHEST - 2 VIEW  Comparison: None.  Findings: There is evidence of probable COPD.  No infiltrate, pneumothorax, consolidation, nodule or pleural fluid is seen. Heart size is within normal limits.  Bony thorax shows osteopenia as well as significant scoliosis of the thoracolumbar spine.  IMPRESSION: Probable COPD.  No acute findings.   Original Report Authenticated By: Irish Lack,  M.D.     Review of Systems  Constitutional: Negative for diaphoresis.  HENT: Negative for congestion, sore throat and neck pain.   Respiratory: Negative for cough, hemoptysis, shortness of breath, wheezing and stridor.   Cardiovascular: Positive for chest pain. Negative for palpitations, orthopnea, claudication, leg swelling and PND.  Gastrointestinal: Negative for heartburn, nausea, vomiting, abdominal pain, diarrhea, constipation, blood in stool and melena.  Genitourinary: Negative for hematuria.  Musculoskeletal: Negative for back pain.  Neurological: Negative for dizziness and loss of consciousness.   Blood pressure 133/73, pulse 85, temperature 98.1 F (36.7 C), temperature source Oral, resp. Diamond 17, height 5\' 1"  (1.549 m), weight 109 lb 12.6 oz (49.8 kg), SpO2 98.00%. Physical Exam  Constitutional: She is oriented to person, place, and time. She appears well-developed. No distress.  HENT:  Head: Normocephalic and atraumatic.  Eyes: Conjunctivae and EOM are normal. Pupils are equal, round, and reactive to light.  Neck: Normal range of motion. Neck supple. Normal carotid pulses and no JVD present. Carotid bruit is not present. No thyromegaly present.  Cardiovascular: Intact distal pulses.  An irregularly irregular rhythm present. Exam reveals no gallop and no friction rub.   No murmur heard. Pulses:      Carotid pulses are 2+ on the right side, and 2+ on the left side.      Radial pulses are 2+ on the right side, and 2+ on the left side.       Dorsalis pedis pulses are 2+ on the right side, and 2+ on the left side.  Respiratory: Effort normal and breath sounds normal. No stridor. No respiratory distress. She has no wheezes. She has no rales.  GI: Soft. Bowel sounds are normal. She exhibits no distension and no mass. There is no tenderness.  Musculoskeletal: She exhibits no edema.  Lymphadenopathy:    She has no cervical adenopathy.  Neurological: She is alert and oriented to  person, place, and time.  Skin: Skin is warm and dry. She is not diaphoretic.  Psychiatric: She has a normal mood and affect. Diamond behavior is normal.    Assessment/Plan: Principal Problem:   Chest pain at rest Active Problems:   Hypertension   HLD (hyperlipidemia)   Coronary artery disease - High risk NST 08/2009, pt refused diagnostic heart cath, EF >55% by echo 08/2009   Atrial Thomas - Diamond controlled    Acid reflux disease   Chronic anticoagulation - with warfarin  Plan: Two EKGs have been obtained, both showing atrial Thomas (Diamond controlled) w/ no acute changes. CXR was normal.  Troponin negative x 2. Awaiting last set. Pt denies further chest pain. Diamond symptoms seem to be atypical, however, she is female and over the age of 51. These two groups tend to present with more atypical chest pain. Pt had a high risk NST in March of 2011 and did not wish to undergo diagnostic heart catheterization/ intervention at that time. She is not on daily ASA. She has HLD, but is not on a statin (not interested in any statin therapy and intolerant to pravastatin). Considering Diamond abnormal NST in the past, as well as risk factors (HTN, HLD) more aggressive medical therapy may need to be considered. The patient is 79 y/o so the risk vs benefit of diagnostic cath needs to be taken in consideration. Will await final troponin level. Will continue to monitor. INR is also slightly sub therapeutic. Recommended therapeutic level for a-fib is 2.0-3.0. INR is 1.89. Pharmacy is now following and will manage warfarin.  MD to follow with further recommendation.   Allayne Butcher, PA-C 08/27/2012, 9:45 AM

## 2012-08-27 NOTE — Progress Notes (Signed)
Unable to obtain PT evaluation today. NCM spoke with patient and patient informed that she has three pronged cane at home as well as walker and feels she does not need at this time. Spoke to [place] PA-c regarding PT consult; okay to cancel d/t recent walk with RN. Mamie Levers

## 2012-08-27 NOTE — Discharge Summary (Signed)
I don not think that her symptoms were cardiac in nature.  She has r/o MI & is ambulating without recurrent Sx - arguing against obstructive CAD as the etiology of her sx.  Ok for d/c today.  Marykay Lex, M.D., M.S. [facility] [address] [place], [place]  [address]  [phone] Pager # [phone] 08/27/2012 3:24 PM

## 2012-08-27 NOTE — Progress Notes (Signed)
Ambulated 277ft  with patient using cane. Denies chest pain or shortness of breath. Patient stopped occasionally d/t being off balanced. Returned to bed w/out incidence. Call bell near. Diamond Thomas

## 2012-08-28 NOTE — Progress Notes (Signed)
Discharge instructions along with medlist and script provided. Patient transported via wheelchair to lobby for discharge home. Diamond Thomas

## 2013-03-16 ENCOUNTER — Encounter: Payer: Self-pay | Admitting: Cardiology

## 2013-03-23 ENCOUNTER — Ambulatory Visit (INDEPENDENT_AMBULATORY_CARE_PROVIDER_SITE_OTHER): Payer: Medicare PPO | Admitting: Cardiology

## 2013-03-23 ENCOUNTER — Encounter: Payer: Self-pay | Admitting: Cardiology

## 2013-03-23 VITALS — BP 130/82 | Ht 63.0 in | Wt 113.6 lb

## 2013-03-23 DIAGNOSIS — I2510 Atherosclerotic heart disease of native coronary artery without angina pectoris: Secondary | ICD-10-CM

## 2013-03-23 DIAGNOSIS — E785 Hyperlipidemia, unspecified: Secondary | ICD-10-CM | POA: Insufficient documentation

## 2013-03-23 DIAGNOSIS — I10 Essential (primary) hypertension: Secondary | ICD-10-CM

## 2013-03-23 DIAGNOSIS — I4891 Unspecified atrial fibrillation: Secondary | ICD-10-CM

## 2013-03-23 DIAGNOSIS — R079 Chest pain, unspecified: Secondary | ICD-10-CM

## 2013-03-23 DIAGNOSIS — I482 Chronic atrial fibrillation, unspecified: Secondary | ICD-10-CM | POA: Insufficient documentation

## 2013-03-23 NOTE — Progress Notes (Signed)
PCP: RAMACHANDRAN,AJITH, MD  Clinic Note: Chief Complaint  Patient presents with  . 6 month visit    no chest pain, no sob little if busy , little bit swelling    HPI: Diamond Thomas is a 79 y.o. female with a PMH below who presents today for followup of atrial fibrillation.  She was admitted for overnight observation in the hospital back in February on the 26th 2 this is shortly after I saw her.  She had an episode of significant chest discomfort at rest which resolved.  The symptoms are quite atypical and likely nonanginal in nature.  She's not had any more since.  The pain was reproducible with palpation the chest wall.    Interval History: She presents today relatively free of any significant symptoms.  She does not acknowledge or note any about her being in atrial fibrillation.  She denies any palpitations or rapid heart beats.  She says if she overexerts herself, she would get short of breath but otherwise denies any chest tightness pressure or short of breath with rest or exertion.  She does have occasional mild dependent edema.  She denies any lightheadedness, dizziness or wooziness.  No subcutaneous symptoms, TIA or amaurosis fugax symptoms. She also denies any melena, hematochezia or hematuria.  No significant nosebleeds.  She'll denies any claudication symptoms.  She is relatively active but does not do any routine exercise.  Past Medical History  Diagnosis Date  . Hyperlipemia   . Hypertension   . Chronic atrial fibrillation     controlled on metoprolol and anticoagulated with Coumadin(Dr Ramachandran) .  Marland Kitchen H/O echocardiogram 09/20/2009    EF greater than 55%, no wall motion, bilateral atrial dilation.  Mild aortic sclerosis with no stenosis  . History of nuclear stress test 09/20/2009    Myoview : meduim sized, moderate intensity perfusion defect in the basal inferior wall with moderate reversibility thought to be High Risk scan; deferred invasive evaluation based on  patient wishes.    Prior Cardiac Evaluation and Past Surgical History: Past Surgical History  Procedure Laterality Date  . Tonsillectomy      Allergies  Allergen Reactions  . Latex Itching  . Pravachol [Pravastatin Sodium] Other (See Comments)    Myalgias    Current Outpatient Prescriptions  Medication Sig Dispense Refill  . calcium carbonate (OS-CAL - DOSED IN MG OF ELEMENTAL CALCIUM) 1250 MG tablet Take 1 tablet by mouth 2 (two) times daily.      . cholecalciferol (VITAMIN D) 1000 UNITS tablet Take 1,000 Units by mouth 2 (two) times daily.      . clobetasol cream (TEMOVATE) 0.05 %       . fish oil-omega-3 fatty acids 1000 MG capsule Take 1 g by mouth every morning.      . magnesium chloride (SLOW-MAG) 64 MG TBEC Take 1 tablet by mouth 2 (two) times daily.      . metoprolol-hydrochlorothiazide (LOPRESSOR HCT) 50-25 MG per tablet Take 0.5 tablets by mouth daily.      . Multiple Vitamins-Minerals (PRESERVISION AREDS) TABS Take 1 tablet by mouth daily.      . nitroGLYCERIN (NITROSTAT) 0.4 MG SL tablet Place 1 tablet (0.4 mg total) under the tongue every 5 (five) minutes as needed for chest pain.  25 tablet  5  . omeprazole (PRILOSEC) 20 MG capsule Take 20 mg by mouth daily.      Marland Kitchen Propylene Glycol (SYSTANE BALANCE OP) Apply to eye as needed.      Marland Kitchen  traMADol (ULTRAM) 50 MG tablet       . warfarin (COUMADIN) 5 MG tablet Take 2.5-5 mg by mouth daily. 0.5 tablet 6 days a week; 1 tablet on Monday       No current facility-administered medications for this visit.    History   Social History Narrative   She widowed ,2 children ,1 grandchild . 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 No tobacco use . No alcohol use. No chewing tobacco     ROS: A comprehensive Review of Systems - Negative with the exception of mild positives noted above in history of present illness.  PHYSICAL EXAM BP 130/82  Ht 5\' 3"  (1.6 m)  Wt 113 lb 9.6 oz (51.529 kg)  BMI 20.13 kg/m2 General  appearance: alert, cooperative, appears stated age, no distress and Very thin, but overall healthy appearing.  Answers questions appropriately A&O x 3 Neck: no adenopathy, no carotid bruit, no JVD, supple, symmetrical, trachea midline and thyroid not enlarged, symmetric, no tenderness/mass/nodules Lungs: clear to auscultation bilaterally, normal percussion bilaterally and Nonlabored, good air movement Heart: irregularly irregular rhythm, no S3 or S4, systolic murmur: systolic ejection 1/6, low pitch, crescendo and decrescendo at 2nd right intercostal space, no click and no rub Abdomen: soft, non-tender; bowel sounds normal; no masses,  no organomegaly Extremities: extremities normal, atraumatic, no cyanosis or edema and venous stasis dermatitis noted Pulses: 2+ and symmetric Neurologic: Grossly normal HEENT: [place]/AT, EOMI, MMM, anicteric sclera  [id number]:[id number] today: Yes Rate:74 , Rhythm: Atrial Fibrillation; Non Specific ST-T abnormalities  Recent Labs: None  ASSESSMENT / PLAN: Atrial fibrillation - rate controlled  Early asymptomatic, rate controlled on moderate dose beta blocker.  Anticoagulated on warfarin.    Coronary artery disease - High risk NST 08/2009, pt refused diagnostic heart cath, EF >55% by echo 08/2009 Despite having high risk Myoview stress test in the past, she's not had any symptoms consistent with angina.  She does not wish any invasive procedures.  For medical management she is on beta blocker. She is now on aspirin due to being on warfarin.  Hypertension Well controlled on combination of metoprolol and HCTZ  Hyperlipemia Monitored by primary physician.  Currently on omega-3 fatty acids.  Will defer management to primary care provider, as she is reluctant to use statin therapy.  She was intolerant of Pravachol in the past.   No orders of the defined types were placed in this encounter.    Followup: One year  DAVID W. Herbie Baltimore, M.D., M.S. [facility] [address] [place], [place]  [address]  [phone] Pager # [phone]

## 2013-03-23 NOTE — Assessment & Plan Note (Signed)
Well controlled on combination of metoprolol and HCTZ

## 2013-03-23 NOTE — Assessment & Plan Note (Signed)
Despite having high risk Myoview stress test in the past, she's not had any symptoms consistent with angina.  She does not wish any invasive procedures.  For medical management she is on beta blocker. She is now on aspirin due to being on warfarin.

## 2013-03-23 NOTE — Patient Instructions (Signed)
Your physician wants you to follow-up in 12 month Dr Herbie Baltimore.  You will receive a reminder letter in the mail two months in advance. If you don't receive a letter, please call our office to schedule the follow-up appointment.  KEEP your feet up for swelling.  continue current medication

## 2013-03-23 NOTE — Assessment & Plan Note (Signed)
Early asymptomatic, rate controlled on moderate dose beta blocker.  Anticoagulated on warfarin.

## 2013-03-23 NOTE — Assessment & Plan Note (Signed)
Monitored by primary physician.  Currently on omega-3 fatty acids.  Will defer management to primary care provider, as she is reluctant to use statin therapy.  She was intolerant of Pravachol in the past.

## 2013-04-30 ENCOUNTER — Encounter: Payer: Self-pay | Admitting: Cardiology

## 2013-07-25 ENCOUNTER — Emergency Department (HOSPITAL_COMMUNITY): Payer: Medicare PPO

## 2013-07-25 ENCOUNTER — Emergency Department (HOSPITAL_COMMUNITY)
Admission: EM | Admit: 2013-07-25 | Discharge: 2013-07-25 | Disposition: A | Discharge status: Home / Self Care | Payer: Medicare PPO | Attending: Emergency Medicine | Admitting: Emergency Medicine

## 2013-07-25 ENCOUNTER — Encounter (HOSPITAL_COMMUNITY): Payer: Self-pay | Admitting: Emergency Medicine

## 2013-07-25 DIAGNOSIS — S8990XA Unspecified injury of unspecified lower leg, initial encounter: Secondary | ICD-10-CM | POA: Insufficient documentation

## 2013-07-25 DIAGNOSIS — I10 Essential (primary) hypertension: Secondary | ICD-10-CM | POA: Insufficient documentation

## 2013-07-25 DIAGNOSIS — S4980XA Other specified injuries of shoulder and upper arm, unspecified arm, initial encounter: Secondary | ICD-10-CM | POA: Insufficient documentation

## 2013-07-25 DIAGNOSIS — I4891 Unspecified atrial fibrillation: Secondary | ICD-10-CM | POA: Insufficient documentation

## 2013-07-25 DIAGNOSIS — Y9289 Other specified places as the place of occurrence of the external cause: Secondary | ICD-10-CM | POA: Insufficient documentation

## 2013-07-25 DIAGNOSIS — Z862 Personal history of diseases of the blood and blood-forming organs and certain disorders involving the immune mechanism: Secondary | ICD-10-CM | POA: Insufficient documentation

## 2013-07-25 DIAGNOSIS — S99919A Unspecified injury of unspecified ankle, initial encounter: Secondary | ICD-10-CM

## 2013-07-25 DIAGNOSIS — Z79899 Other long term (current) drug therapy: Secondary | ICD-10-CM | POA: Insufficient documentation

## 2013-07-25 DIAGNOSIS — R040 Epistaxis: Secondary | ICD-10-CM | POA: Insufficient documentation

## 2013-07-25 DIAGNOSIS — S199XXA Unspecified injury of neck, initial encounter: Secondary | ICD-10-CM

## 2013-07-25 DIAGNOSIS — S99929A Unspecified injury of unspecified foot, initial encounter: Secondary | ICD-10-CM

## 2013-07-25 DIAGNOSIS — W19XXXA Unspecified fall, initial encounter: Secondary | ICD-10-CM

## 2013-07-25 DIAGNOSIS — Y939 Activity, unspecified: Secondary | ICD-10-CM | POA: Insufficient documentation

## 2013-07-25 DIAGNOSIS — W1809XA Striking against other object with subsequent fall, initial encounter: Secondary | ICD-10-CM | POA: Insufficient documentation

## 2013-07-25 DIAGNOSIS — S46909A Unspecified injury of unspecified muscle, fascia and tendon at shoulder and upper arm level, unspecified arm, initial encounter: Secondary | ICD-10-CM | POA: Insufficient documentation

## 2013-07-25 DIAGNOSIS — S0993XA Unspecified injury of face, initial encounter: Secondary | ICD-10-CM | POA: Insufficient documentation

## 2013-07-25 DIAGNOSIS — Z7901 Long term (current) use of anticoagulants: Secondary | ICD-10-CM | POA: Insufficient documentation

## 2013-07-25 DIAGNOSIS — W010XXA Fall on same level from slipping, tripping and stumbling without subsequent striking against object, initial encounter: Secondary | ICD-10-CM | POA: Insufficient documentation

## 2013-07-25 DIAGNOSIS — Z8639 Personal history of other endocrine, nutritional and metabolic disease: Secondary | ICD-10-CM | POA: Insufficient documentation

## 2013-07-25 DIAGNOSIS — S0990XA Unspecified injury of head, initial encounter: Secondary | ICD-10-CM | POA: Insufficient documentation

## 2013-07-25 NOTE — Discharge Instructions (Signed)

## 2013-07-25 NOTE — ED Notes (Signed)
Patient transported to CT 

## 2013-07-25 NOTE — ED Notes (Signed)
Pt states she got tripped up and fell striking her R shoulder and face in shrubbery.  Pt states she had a nosebleed and placed cotton in her L nare.  Bleeding controlled.  Pt denies LOC.  Pt is on Coumadin.

## 2013-07-25 NOTE — ED Provider Notes (Signed)
CSN: [id number]     Arrival date & time 07/25/13  1229 History   First MD Initiated Contact with Patient 07/25/13 1304     Chief Complaint  Patient presents with  . Fall  . Epistaxis  . Shoulder Pain   (Consider location/radiation/quality/duration/timing/severity/associated sxs/prior Treatment) HPI Comments: Patient presents to the emergency department with chief complaint of fall. She states that she tripped over a root, and fell into some bushes. She reports striking her right shoulder and face in the bushes. She complains of mild to moderate pain in her head neck, as well as her right shoulder, and right knee. She denies any loss of consciousness. She states that following that incident she did have a bloody nose, but this has since resolved. She takes Coumadin for A. fib. No other complaints at this time.  The history is provided by the patient. No language interpreter was used.    Past Medical History  Diagnosis Date  . Hyperlipemia   . Hypertension   . Chronic atrial fibrillation     controlled on metoprolol and anticoagulated with Coumadin(Dr Ramachandran) .  Marland Kitchen H/O echocardiogram 09/20/2009    EF greater than 55%, no wall motion, bilateral atrial dilation.  Mild aortic sclerosis with no stenosis  . History of nuclear stress test 09/20/2009    Myoview : meduim sized, moderate intensity perfusion defect in the basal inferior wall with moderate reversibility thought to be High Risk scan; deferred invasive evaluation based on patient wishes.   Past Surgical History  Procedure Laterality Date  . Tonsillectomy     No family history on file. History  Substance Use Topics  . Smoking status: Never Smoker   . Smokeless tobacco: Not on file  . Alcohol Use: No   OB History   Grav Para Term Preterm Abortions TAB SAB Ect Mult Living                 Review of Systems  All other systems reviewed and are negative.    Allergies  Latex and Pravachol  Home Medications   Current  Outpatient Rx  Name  Route  Sig  Dispense  Refill  . calcium carbonate (OS-CAL - DOSED IN MG OF ELEMENTAL CALCIUM) 1250 MG tablet   Oral   Take 1 tablet by mouth 2 (two) times daily.         . cholecalciferol (VITAMIN D) 1000 UNITS tablet   Oral   Take 1,000 Units by mouth 2 (two) times daily.         . clobetasol cream (TEMOVATE) 0.05 %               . fish oil-omega-3 fatty acids 1000 MG capsule   Oral   Take 1 g by mouth every morning.         . magnesium chloride (SLOW-MAG) 64 MG TBEC   Oral   Take 1 tablet by mouth 2 (two) times daily.         . metoprolol-hydrochlorothiazide (LOPRESSOR HCT) 50-25 MG per tablet   Oral   Take 0.5 tablets by mouth daily.         . Multiple Vitamins-Minerals (PRESERVISION AREDS) TABS   Oral   Take 1 tablet by mouth 2 (two) times daily.          Marland Kitchen omeprazole (PRILOSEC) 20 MG capsule   Oral   Take 20 mg by mouth daily.         Marland Kitchen Propylene Glycol (SYSTANE BALANCE OP)  Ophthalmic   Apply to eye as needed.         . traMADol (ULTRAM) 50 MG tablet               . warfarin (COUMADIN) 5 MG tablet   Oral   Take 2.5-5 mg by mouth daily. 0.5 tablet 5 days a week; 1 tablet on Monday and Friday         . nitroGLYCERIN (NITROSTAT) 0.4 MG SL tablet   Sublingual   Place 1 tablet (0.4 mg total) under the tongue every 5 (five) minutes as needed for chest pain.   25 tablet   5    BP 150/84  Pulse 88  Temp(Src) 97.6 F (36.4 C)  Resp 17  Ht 5' 1.5" (1.562 m)  Wt 113 lb (51.256 kg)  BMI 21.01 kg/m2  SpO2 98% Physical Exam  Nursing note and vitals reviewed. Constitutional: She is oriented to person, place, and time. She appears well-developed and well-nourished.  HENT:  Head: Normocephalic and atraumatic.  Eyes: Conjunctivae and EOM are normal. Pupils are equal, round, and reactive to light.  Neck: Normal range of motion. Neck supple.  Mild tenderness to palpation of cervical spine, no step-offs or deformities   Cardiovascular: Normal rate and regular rhythm.  Exam reveals no gallop and no friction rub.   No murmur heard. Pulmonary/Chest: Effort normal and breath sounds normal. No respiratory distress. She has no wheezes. She has no rales. She exhibits no tenderness.  Abdominal: Soft. Bowel sounds are normal. She exhibits no distension and no mass. There is no tenderness. There is no rebound and no guarding.  Musculoskeletal: Normal range of motion. She exhibits no edema and no tenderness.  Moves all extremities, bilateral upper and lower extremities full range of motion and strength  Neurological: She is alert and oriented to person, place, and time.  CN 3-12 intact, sensation and strength intact throughout  Skin: Skin is warm and dry.  Psychiatric: She has a normal mood and affect. Her behavior is normal. Judgment and thought content normal.    ED Course  Procedures (including critical care time) Results for orders placed during the hospital encounter of 08/26/12  CBC      Result Value Range   WBC 6.5  4.0 - 10.5 K/uL   RBC 4.10  3.87 - 5.11 MIL/uL   Hemoglobin 11.8 (*) 12.0 - 15.0 g/dL   HCT 16.134.9 (*) 09.636.0 - 04.546.0 %   MCV 85.1  78.0 - 100.0 fL   MCH 28.8  26.0 - 34.0 pg   MCHC 33.8  30.0 - 36.0 g/dL   RDW 40.914.2  81.111.5 - 91.415.5 %   Platelets 299  150 - 400 K/uL  BASIC METABOLIC PANEL      Result Value Range   Sodium 133 (*) 135 - 145 mEq/L   Potassium 3.8  3.5 - 5.1 mEq/L   Chloride 95 (*) 96 - 112 mEq/L   CO2 29  19 - 32 mEq/L   Glucose, Bld 101 (*) 70 - 99 mg/dL   BUN 19  6 - 23 mg/dL   Creatinine, Ser 7.820.56  0.50 - 1.10 mg/dL   Calcium 9.5  8.4 - 95.610.5 mg/dL   GFR calc non Af Amer 83 (*) >90 mL/min   GFR calc Af Amer >90  >90 mL/min  PROTIME-INR      Result Value Range   Prothrombin Time 21.3 (*) 11.6 - 15.2 seconds   INR 1.93 (*) 0.00 - 1.49  TROPONIN I      Result Value Range   Troponin I <0.30  <0.30 ng/mL  TROPONIN I      Result Value Range   Troponin I <0.30  <0.30 ng/mL   CBC      Result Value Range   WBC 9.0  4.0 - 10.5 K/uL   RBC 4.02  3.87 - 5.11 MIL/uL   Hemoglobin 11.5 (*) 12.0 - 15.0 g/dL   HCT 16.1 (*) 09.6 - 04.5 %   MCV 85.1  78.0 - 100.0 fL   MCH 28.6  26.0 - 34.0 pg   MCHC 33.6  30.0 - 36.0 g/dL   RDW 40.9  81.1 - 91.4 %   Platelets 291  150 - 400 K/uL  BASIC METABOLIC PANEL      Result Value Range   Sodium 134 (*) 135 - 145 mEq/L   Potassium 3.9  3.5 - 5.1 mEq/L   Chloride 96  96 - 112 mEq/L   CO2 30  19 - 32 mEq/L   Glucose, Bld 88  70 - 99 mg/dL   BUN 17  6 - 23 mg/dL   Creatinine, Ser 7.82  0.50 - 1.10 mg/dL   Calcium 9.7  8.4 - 95.6 mg/dL   GFR calc non Af Amer 83 (*) >90 mL/min   GFR calc Af Amer >90  >90 mL/min  PROTIME-INR      Result Value Range   Prothrombin Time 21.0 (*) 11.6 - 15.2 seconds   INR 1.89 (*) 0.00 - 1.49  TROPONIN I      Result Value Range   Troponin I <0.30  <0.30 ng/mL  POCT I-STAT TROPONIN I      Result Value Range   Troponin i, poc 0.00  0.00 - 0.08 ng/mL   Comment 3            Dg Shoulder Right  07/25/2013   CLINICAL DATA:  Fall.  Right shoulder injury and pain.  EXAM: RIGHT SHOULDER - 2+ VIEW  COMPARISON:  None.  FINDINGS: There is no evidence of fracture or dislocation. There is no evidence of arthropathy or other focal bone abnormality. Soft tissues are unremarkable.  IMPRESSION: Negative.   Electronically Signed   By: Myles Rosenthal M.D.   On: 07/25/2013 13:48   Ct Head Wo Contrast  07/25/2013   CLINICAL DATA:  Fall.  On Coumadin.  EXAM: CT HEAD WITHOUT CONTRAST  TECHNIQUE: Contiguous axial images were obtained from the base of the skull through the vertex without intravenous contrast.  COMPARISON:  None.  FINDINGS: No skull fracture or intracranial hemorrhage.  Small vessel disease type changes without CT evidence of large acute infarct.  Atrophy. Ventricular prominence may be related to atrophy. Very mild component of hydrocephalus not entirely excluded.  No intracranial mass lesion noted on this  unenhanced exam.  Visualized sinuses, mastoid air cells and middle ear cavities are clear.  IMPRESSION: No skull fracture or intracranial hemorrhage.  Please see above.   Electronically Signed   By: Bridgett Larsson M.D.   On: 07/25/2013 13:25   Ct Cervical Spine Wo Contrast  07/25/2013   CLINICAL DATA:  Fall.  Patient on Coumadin.  EXAM: CT CERVICAL SPINE WITHOUT CONTRAST  TECHNIQUE: Multidetector CT imaging of the cervical spine was performed without intravenous contrast. Multiplanar CT image reconstructions were also generated.  COMPARISON:  None.  FINDINGS: Exam demonstrates mild reversal of the normal cervical lordosis. There is moderate spondylosis throughout the cervical spine. There  is moderate disc space narrowing at multiple levels most prominent at 3 C5-6 and C6-7 levels. Prevertebral soft tissues are within normal. There is no evidence of traumatic subluxation. There is moderate uncovertebral joint spurring and facet arthropathy. The atlantoaxial articulation demonstrates mild degenerative changes as there is a chronic fragment just posterior to the junction of the foramen magnum to the anterior arch of C1. There is no acute fracture. There is neural foraminal narrowing at multiple levels due to adjacent bony spurring.  There are multiple hypodense thyroid nodules likely a order.  IMPRESSION: No acute injury.  Moderate spondylosis of the cervical spine with significant multilevel degenerative disc disease and neural foramen narrowing at multiple levels.  Changes of thyroid goiter.   Electronically Signed   By: Elberta Fortis M.D.   On: 07/25/2013 14:43   Dg Knee Complete 4 Views Right  07/25/2013   CLINICAL DATA:  Fall  EXAM: RIGHT KNEE - COMPLETE 4+ VIEW  COMPARISON:  None.  FINDINGS: No acute fracture or dislocation. Osteopenia. Advanced degenerative change of the knee joint involving all 3 compartments.  IMPRESSION: No acute bony pathology.  Degenerative change.   Electronically Signed   By: Maryclare Bean M.D.   On: 07/25/2013 15:45      EKG Interpretation   None       MDM   1. Fall     Patient with mechanical fall. CT head and right shoulder film was obtained in triage, I will add CT cervical spine and right knee. Followup on imaging, anticipate discharge.   Imaging is negative.  Discharge to home with PCP follow-up.  Patient seen by and discussed with Dr. Gwendolyn Grant, who agrees with the plan.   Roxy Horseman, PA-C 07/25/13 1614

## 2013-07-25 NOTE — ED Provider Notes (Signed)
Medical screening examination/treatment/procedure(s) were conducted as a shared visit with non-physician practitioner(s) and myself.  I personally evaluated the patient during the encounter.  EKG Interpretation   None       Patient here s/p mechanical fall. On coumadin. No LOC, no nausea or vomiting. Epistaxis resolved, no nasoseptal hematoma. Imaging normal. Stable for discharge.   Dagmar HaitWilliam Pressley Tadesse, MD 07/25/13 1739

## 2013-07-25 NOTE — ED Notes (Signed)
Pt sitting in chair in room.

## 2014-07-16 ENCOUNTER — Ambulatory Visit (INDEPENDENT_AMBULATORY_CARE_PROVIDER_SITE_OTHER): Payer: Medicare PPO | Admitting: Cardiology

## 2014-07-16 VITALS — Ht 61.0 in | Wt 114.9 lb

## 2014-07-16 DIAGNOSIS — E785 Hyperlipidemia, unspecified: Secondary | ICD-10-CM

## 2014-07-16 DIAGNOSIS — I482 Chronic atrial fibrillation, unspecified: Secondary | ICD-10-CM

## 2014-07-16 DIAGNOSIS — R079 Chest pain, unspecified: Secondary | ICD-10-CM

## 2014-07-16 DIAGNOSIS — I2510 Atherosclerotic heart disease of native coronary artery without angina pectoris: Secondary | ICD-10-CM

## 2014-07-16 DIAGNOSIS — R931 Abnormal findings on diagnostic imaging of heart and coronary circulation: Secondary | ICD-10-CM

## 2014-07-16 DIAGNOSIS — I10 Essential (primary) hypertension: Secondary | ICD-10-CM

## 2014-07-16 DIAGNOSIS — Z7901 Long term (current) use of anticoagulants: Secondary | ICD-10-CM

## 2014-07-16 DIAGNOSIS — R9439 Abnormal result of other cardiovascular function study: Secondary | ICD-10-CM

## 2014-07-16 DIAGNOSIS — I480 Paroxysmal atrial fibrillation: Secondary | ICD-10-CM

## 2014-07-16 NOTE — Patient Instructions (Signed)
Your physician wants you to follow-up in: 1 year or sooner if needed with Dr. Herbie BaltimoreHarding. No changes have been made today in your therapy. You will receive a reminder letter in the mail two months in advance. If you don't receive a letter, please call our office to schedule the follow-up appointment.

## 2014-07-18 ENCOUNTER — Encounter: Payer: Self-pay | Admitting: Cardiology

## 2014-07-18 NOTE — Progress Notes (Signed)
PATIENTHANADI Thomas MRN: [id number] DOD:February 06, 1925  DATE OF SERVICE: 07/17/2014  PCP: Georgianne Fick, MD  Clinic Note: Chief Complaint  Patient presents with  . Annual Exam    patient reports having some episodes of chest pain the last visit.  . Atrial Fibrillation    HPI: Diamond Thomas is a 81 y.o. female with a PMH below who presents today for delayed annual followup of atrial fibrillation.  She is a patient of Dr. Nicholos Johns. When diagnosed with A. Fib back in 2011, she had a moderate to high risk abnormal Myoview stress test showing medium sized moderate intensity perfusion defect in the basal inferior and inferior wall with moderate reversibility. She at that time requested that she would not want to undergo coronary angiography. Her EF has been normal by echocardiogram and Myoview. She had a brief hospitalization in February of 2014 for chest pain thought to be musculoskeletal in nature. I last saw her in September 2014, plan after long discussion with the patient and her daughter decided that she could be seen on an annual follow-up basis.  Interval History: She presents today, as usual relatively free of any significant symptoms.  She does not acknowledge or note any about her being in atrial fibrillation.  She denies any palpitations or rapid heart beats.  She says if she overexerts herself, she would get short of breath but otherwise denies any chest tightness pressure or short of breath with rest or exertion.  She basically says that if she gets a bit short of breath when she is doing something to slow down a bit but doesn't stop.  Rarely, does she ever have any chest discomfort. He is usually fleeting, to the point that she would not have time to take nitroglycerin before the symptom is gone.  She does have occasional mild dependent edema. She admits to occasionally forgetting to take her warfarin pill.  She denies any lightheadedness, dizziness or wooziness.  No subcutaneous  symptoms, TIA or amaurosis fugax symptoms. She also denies any melena, hematochezia or hematuria.  No significant nosebleeds.  She'll denies any claudication symptoms.  She is relatively active but does not do any routine exercise.  Past Medical History  Diagnosis Date  . Hyperlipemia   . Hypertension   . Chronic atrial fibrillation     controlled on metoprolol and anticoagulated with Coumadin(Dr Ramachandran) .  Marland Kitchen H/O echocardiogram 09/20/2009    EF greater than 55%, no wall motion, bilateral atrial dilation.  Mild aortic sclerosis with no stenosis  . History of nuclear stress test 09/20/2009    Myoview : meduim sized, moderate intensity perfusion defect in the basal inferior wall with moderate reversibility thought to be High Risk scan; deferred invasive evaluation based on patient wishes.   Allergies  Allergen Reactions  . Latex Itching  . Pravachol [Pravastatin Sodium] Other (See Comments)    Myalgias    Current outpatient prescriptions:  .  calcium carbonate (OS-CAL - DOSED IN MG OF ELEMENTAL CALCIUM) 1250 MG tablet, Take 1 tablet by mouth 2 (two) times daily., Disp: , Rfl:  .  cholecalciferol (VITAMIN D) 1000 UNITS tablet, Take 1,000 Units by mouth 2 (two) times daily., Disp: , Rfl:  .  Magnesium 250 MG TABS, Take 1 tablet by mouth daily., Disp: , Rfl:  .  magnesium chloride (SLOW-MAG) 64 MG TBEC, Take 1 tablet by mouth 2 (two) times daily., Disp: , Rfl:  .  metoprolol-hydrochlorothiazide (LOPRESSOR HCT) 50-25 MG per tablet, Take 0.5 tablets by mouth daily.,  Disp: , Rfl:  .  nitroGLYCERIN (NITROSTAT) 0.4 MG SL tablet, Place 1 tablet (0.4 mg total) under the tongue every 5 (five) minutes as needed for chest pain., Disp: 25 tablet, Rfl: 5 .  Omega-3 Fatty Acids (FISH OIL) 1200 MG CAPS, Take 1 capsule by mouth daily., Disp: , Rfl:  .  Propylene Glycol (SYSTANE BALANCE OP), Apply to eye as needed., Disp: , Rfl:  .  traMADol (ULTRAM) 50 MG tablet, , Disp: , Rfl:  .  warfarin  (COUMADIN) 5 MG tablet, Take 2.5-5 mg by mouth daily. 0.5 tablet 5 days a week; 1 tablet on Monday and Friday, Disp: , Rfl:    History   Social History Narrative   She widowed ,2 children ,1 grandchild . 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 No tobacco use . No alcohol use. No chewing tobacco     ROS: A comprehensive Review of Systems - Negative with the exception of mild positives noted above in history of present illness.  Review of Systems  Constitutional: Negative for malaise/fatigue.  HENT: Negative for nosebleeds.   Respiratory: Negative for cough.   Cardiovascular: Negative for claudication.  Gastrointestinal: Negative for blood in stool and melena.  Genitourinary: Negative for hematuria.  Musculoskeletal: Positive for joint pain. Negative for myalgias.  Neurological: Negative for dizziness.  Endo/Heme/Allergies: Does not bruise/bleed easily.  Psychiatric/Behavioral: Positive for memory loss (Minimal). The patient does not have insomnia.   All other systems reviewed and are negative.  PHYSICAL EXAM Ht 5\' 1"  (1.549 m)  Wt 114 lb 14.4 oz (52.118 kg)  BMI 21.72 kg/m2 General appearance: alert, cooperative, appears stated age, no distress and Very thin, but overall healthy appearing.  Answers questions appropriately A&O x 3 HEENT: [place]/AT, EOMI, MMM, anicteric sclera Neck: no adenopathy, no carotid bruit, no JVD, supple, symmetrical, trachea midline and thyroid not enlarged, symmetric, no tenderness/mass/nodules Lungs: clear to auscultation bilaterally, normal percussion bilaterally and Nonlabored, good air movement Heart: irregularly irregular rhythm,normal S1 and S2. no S3 or S4;  1/6 low pitch, c-d SEM @ RUSB; no click and no rub Abdomen: soft, non-tender; bowel sounds normal; no masses,  no organomegaly Extremities: extremities normal, atraumatic, no cyanosis or edema and venous stasis dermatitis noted Pulses: 2+ and symmetric Neurologic: Grossly  normal  RUE:[id number]:Performed today: Yes  Rate:86, Rhythm: Atrial Fibrillation with PVC versus aberrantly conducted beats, Non Specific ST-T abnormalities; otherwise no significant change  Recent Labs: None  ASSESSMENT / PLAN: Chronic atrial fibrillation Rate well controlled with low-dose beta blocker. Totally asymptomatic from the A. Fib standpoint - perhaps with the exception of her feeling "short winded " when she's trying to do something more exertional than usual. Anticoagulated with warfarin the she has had at least one nose bleed but nothing significant.   Chronic anticoagulation - with warfarin INR is being followed by PCP   High risk NST 08/2009, pt refused diagnostic heart cath, EF >55% by echo 08/2009 She continues to not contain any active anginal symptoms. She also reiterated that she would not want to pursue aggressive treatments with cardiac catheterization. For now continue medical therapy. She is on a beta blocker. Not on aspirin because of warfarin.   Essential hypertension Well-controlled on current medication combination   Hyperlipemia Intolerant of statins in the past. Reluctant to try them again. Given her age and lack of symptoms, I think this is not unreasonable to avoid medications that could make her more fatigued her symptomatic. Continue omega-3 fatty acids. Stay active  with diet and exercise is the mainstay of her treatment.    No orders of the defined types were placed in this encounter.    Followup: One year   Marykay Lex, M.D., M.S. Interventional Cardiologist   Pager # [phone]

## 2014-07-18 NOTE — Assessment & Plan Note (Signed)
Rate well controlled with low-dose beta blocker. Totally asymptomatic from the A. Fib standpoint - perhaps with the exception of her feeling "short winded " when she's trying to do something more exertional than usual. Anticoagulated with warfarin the she has had at least one nose bleed but nothing significant.

## 2014-07-18 NOTE — Assessment & Plan Note (Addendum)
She continues to not contain any active anginal symptoms. She also reiterated that she would not want to pursue aggressive treatments with cardiac catheterization. For now continue medical therapy. She is on a beta blocker. Not on aspirin because of warfarin.

## 2014-07-18 NOTE — Assessment & Plan Note (Signed)
Intolerant of statins in the past. Reluctant to try them again. Given her age and lack of symptoms, I think this is not unreasonable to avoid medications that could make her more fatigued her symptomatic. Continue omega-3 fatty acids. Stay active with diet and exercise is the mainstay of her treatment.

## 2014-07-18 NOTE — Assessment & Plan Note (Signed)
INR is being followed by PCP

## 2014-07-18 NOTE — Assessment & Plan Note (Signed)
Well-controlled on current medication combination

## 2014-10-03 ENCOUNTER — Emergency Department (HOSPITAL_COMMUNITY)
Admission: EM | Admit: 2014-10-03 | Discharge: 2014-10-03 | Disposition: A | Discharge status: Home / Self Care | Payer: Medicare PPO | Attending: Emergency Medicine | Admitting: Emergency Medicine

## 2014-10-03 ENCOUNTER — Emergency Department (HOSPITAL_COMMUNITY): Payer: Medicare PPO

## 2014-10-03 ENCOUNTER — Encounter (HOSPITAL_COMMUNITY): Payer: Self-pay | Admitting: *Deleted

## 2014-10-03 DIAGNOSIS — Z8639 Personal history of other endocrine, nutritional and metabolic disease: Secondary | ICD-10-CM | POA: Diagnosis not present

## 2014-10-03 DIAGNOSIS — S3992XA Unspecified injury of lower back, initial encounter: Secondary | ICD-10-CM | POA: Diagnosis not present

## 2014-10-03 DIAGNOSIS — I482 Chronic atrial fibrillation: Secondary | ICD-10-CM | POA: Diagnosis not present

## 2014-10-03 DIAGNOSIS — M6281 Muscle weakness (generalized): Secondary | ICD-10-CM | POA: Insufficient documentation

## 2014-10-03 DIAGNOSIS — Z7901 Long term (current) use of anticoagulants: Secondary | ICD-10-CM | POA: Diagnosis not present

## 2014-10-03 DIAGNOSIS — M545 Low back pain: Secondary | ICD-10-CM

## 2014-10-03 DIAGNOSIS — Y9289 Other specified places as the place of occurrence of the external cause: Secondary | ICD-10-CM | POA: Insufficient documentation

## 2014-10-03 DIAGNOSIS — Y998 Other external cause status: Secondary | ICD-10-CM | POA: Diagnosis not present

## 2014-10-03 DIAGNOSIS — Z79899 Other long term (current) drug therapy: Secondary | ICD-10-CM | POA: Insufficient documentation

## 2014-10-03 DIAGNOSIS — Y9389 Activity, other specified: Secondary | ICD-10-CM | POA: Insufficient documentation

## 2014-10-03 DIAGNOSIS — I10 Essential (primary) hypertension: Secondary | ICD-10-CM | POA: Insufficient documentation

## 2014-10-03 DIAGNOSIS — M5136 Other intervertebral disc degeneration, lumbar region: Secondary | ICD-10-CM | POA: Diagnosis not present

## 2014-10-03 DIAGNOSIS — R1010 Upper abdominal pain, unspecified: Secondary | ICD-10-CM

## 2014-10-03 DIAGNOSIS — S3991XA Unspecified injury of abdomen, initial encounter: Secondary | ICD-10-CM | POA: Insufficient documentation

## 2014-10-03 DIAGNOSIS — I509 Heart failure, unspecified: Secondary | ICD-10-CM | POA: Diagnosis not present

## 2014-10-03 DIAGNOSIS — Z91040 Latex allergy status: Secondary | ICD-10-CM | POA: Diagnosis not present

## 2014-10-03 HISTORY — DX: Heart failure, unspecified: I50.9

## 2014-10-03 LAB — CBC
HCT: 37.7 % (ref 36.0–46.0)
Hemoglobin: 12.4 g/dL (ref 12.0–15.0)
MCH: 28.2 pg (ref 26.0–34.0)
MCHC: 32.9 g/dL (ref 30.0–36.0)
MCV: 85.7 fL (ref 78.0–100.0)
PLATELETS: 290 10*3/uL (ref 150–400)
RBC: 4.4 MIL/uL (ref 3.87–5.11)
RDW: 15.3 % (ref 11.5–15.5)
WBC: 8.7 10*3/uL (ref 4.0–10.5)

## 2014-10-03 LAB — URINALYSIS, ROUTINE W REFLEX MICROSCOPIC
Bilirubin Urine: NEGATIVE
Glucose, UA: NEGATIVE mg/dL
Hgb urine dipstick: NEGATIVE
Ketones, ur: 15 mg/dL — AB
Leukocytes, UA: NEGATIVE
Nitrite: NEGATIVE
PROTEIN: NEGATIVE mg/dL
Urobilinogen, UA: 0.2 mg/dL (ref 0.0–1.0)
pH: 5.5 (ref 5.0–8.0)

## 2014-10-03 LAB — PROTIME-INR
INR: 2.14 — ABNORMAL HIGH (ref 0.00–1.49)
PROTHROMBIN TIME: 24.1 s — AB (ref 11.6–15.2)

## 2014-10-03 LAB — I-STAT TROPONIN, ED: TROPONIN I, POC: 0 ng/mL (ref 0.00–0.08)

## 2014-10-03 LAB — I-STAT CHEM 8, ED
BUN: 20 mg/dL (ref 6–23)
CHLORIDE: 95 mmol/L — AB (ref 96–112)
Calcium, Ion: 1.19 mmol/L (ref 1.13–1.30)
Creatinine, Ser: 0.6 mg/dL (ref 0.50–1.10)
Glucose, Bld: 102 mg/dL — ABNORMAL HIGH (ref 70–99)
HCT: 41 % (ref 36.0–46.0)
Hemoglobin: 13.9 g/dL (ref 12.0–15.0)
Potassium: 3.5 mmol/L (ref 3.5–5.1)
SODIUM: 133 mmol/L — AB (ref 135–145)
TCO2: 24 mmol/L (ref 0–100)

## 2014-10-03 MED ORDER — IOHEXOL 300 MG/ML  SOLN
100.0000 mL | Freq: Once | INTRAMUSCULAR | Status: AC | PRN
  Administered 2014-10-03: 100 mL via INTRAVENOUS

## 2014-10-03 MED ORDER — ACETAMINOPHEN 325 MG PO TABS
650.0000 mg | ORAL_TABLET | Freq: Once | ORAL | Status: AC
  Administered 2014-10-03: 650 mg via ORAL
  Filled 2014-10-03: qty 2

## 2014-10-03 MED ORDER — TRAMADOL HCL 50 MG PO TABS: 50.0000 mg | ORAL_TABLET | Freq: Four times a day (QID) | ORAL | Status: DC | PRN

## 2014-10-03 MED ORDER — SODIUM CHLORIDE 0.9 % IV BOLUS (SEPSIS)
500.0000 mL | Freq: Once | INTRAVENOUS | Status: AC
  Administered 2014-10-03: 500 mL via INTRAVENOUS
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
## 2014-10-03 NOTE — ED Notes (Signed)
Pt monitored by pulse ox, bp cuff, and 5-lead. 

## 2014-10-03 NOTE — ED Notes (Signed)
Pt off unit with xray 

## 2014-10-03 NOTE — ED Notes (Signed)
Pt off unit with CT 
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
## 2014-10-03 NOTE — ED Notes (Signed)
Pt ambulated to the bathroom, tech Josh sent urine specimen to the lab.
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
## 2014-10-03 NOTE — ED Notes (Signed)
PT placed in gown and in bed. Pt monitored by pulse ox, bp cuff, and 12-lead. 

## 2014-10-12 ENCOUNTER — Other Ambulatory Visit: Payer: Self-pay | Admitting: Cardiovascular Disease

## 2014-10-12 NOTE — Telephone Encounter (Signed)
Rx has been sent to the pharmacy electronically. ° °

## 2015-07-19 ENCOUNTER — Encounter: Payer: Self-pay | Admitting: Cardiology

## 2015-07-19 ENCOUNTER — Ambulatory Visit (INDEPENDENT_AMBULATORY_CARE_PROVIDER_SITE_OTHER): Payer: Medicare Other | Admitting: Cardiology

## 2015-07-19 VITALS — BP 126/60 | HR 71 | Ht 60.0 in | Wt 117.5 lb

## 2015-07-19 DIAGNOSIS — E785 Hyperlipidemia, unspecified: Secondary | ICD-10-CM | POA: Diagnosis not present

## 2015-07-19 DIAGNOSIS — I482 Chronic atrial fibrillation, unspecified: Secondary | ICD-10-CM

## 2015-07-19 DIAGNOSIS — R600 Localized edema: Secondary | ICD-10-CM

## 2015-07-19 DIAGNOSIS — R931 Abnormal findings on diagnostic imaging of heart and coronary circulation: Secondary | ICD-10-CM

## 2015-07-19 DIAGNOSIS — R9439 Abnormal result of other cardiovascular function study: Secondary | ICD-10-CM

## 2015-07-19 DIAGNOSIS — Z7901 Long term (current) use of anticoagulants: Secondary | ICD-10-CM

## 2015-07-19 DIAGNOSIS — I10 Essential (primary) hypertension: Secondary | ICD-10-CM

## 2015-07-19 NOTE — Progress Notes (Signed)
PCP: Georgianne Fick, MD  Clinic Note: Chief Complaint  Patient presents with  . Atrial Fibrillation    HPI: Diamond Thomas is a 82 y.o. female with a PMH below who presents today for annual f/u for Chronic Afib - diagnosed in 2011. When diagnosed with A. Fib back in 2011, she had a moderate to high risk abnormal Myoview stress test showing medium sized moderate intensity perfusion defect in the basal inferior and inferior wall with moderate reversibility. She at that time requested that she would not want to undergo coronary angiography. Her EF has been normal by echocardiogram and Myoview. She had a brief hospitalization in February of 2014 for chest pain thought to be musculoskeletal in nature. I last saw her in September 2014, plan after long discussion with the patient and her daughter decided that she could be seen on an annual follow-up basis.  Diamond Thomas was last seen on Jul 16, 2014 -   Recent Hospitalizations: N/A  Studies Reviewed: none   Interval History: Diamond Thomas presents today doing pretty well.  She may note an irregular heartbeat if she is active & her HR is increased, but not when she is at rest or doing ADLs.  She notes her heart rate is increased, she really doesn't feel unusual or abnormal. No worsening dyspnea. She is "slowing down little bit based on her age, but is happily doing most of what she wants to do. She does note a little bit of swelling is in the legs and feet. This is more prominent if she is on her feet for prolonged period time or sitting, but when she is active is less prominent. Not associated with PND or orthopnea. No bleeding complications from warfarin, just has some mild bruising. Occasional odd twinges in her chest, but this may be when she notes her heart rate going up.  No chest pain or shortness of breath with rest or exertion.  No PND, orthopnea or edema.  No palpitations, lightheadedness, dizziness, weakness or syncope/near  syncope. No TIA/amaurosis fugax symptoms. No melena, hematochezia, hematuria, or epstaxis. No claudication.  ROS: A comprehensive was performed. Review of Systems  Constitutional: Negative for malaise/fatigue.  Eyes: Negative for blurred vision.  Respiratory: Negative for cough and shortness of breath.   Musculoskeletal: Positive for back pain and joint pain. Negative for falls.  Neurological: Negative for weakness and headaches.  Endo/Heme/Allergies: Bruises/bleeds easily.  Psychiatric/Behavioral: Negative for depression. The patient does not have insomnia.        Her daughter does not recall or comment on any memory loss.  All other systems reviewed and are negative.    Past Medical History  Diagnosis Date  . Hyperlipemia   . Hypertension   . Chronic atrial fibrillation (HCC)     controlled on metoprolol and anticoagulated with Coumadin(Dr Ramachandran) .  Marland Kitchen H/O echocardiogram 09/20/2009    EF greater than 55%, no wall motion, bilateral atrial dilation.  Mild aortic sclerosis with no stenosis  . History of nuclear stress test 09/20/2009    Myoview : meduim sized, moderate intensity perfusion defect in the basal inferior wall with moderate reversibility thought to be High Risk scan; deferred invasive evaluation based on patient wishes.  . CHF (congestive heart failure) [facility])     Past Surgical History  Procedure Laterality Date  . Tonsillectomy      Prior to Admission medications   Medication Sig Start Date End Date Taking? Authorizing Provider  Calcium Carb-Cholecalciferol (CALCIUM 600 + D PO) Take  1 tablet by mouth 2 (two) times daily.   Yes Historical Provider, MD  Magnesium 250 MG TABS Take 1 tablet by mouth daily.   Yes Historical Provider, MD  metoprolol-hydrochlorothiazide (LOPRESSOR HCT) 50-25 MG per tablet Take 0.5 tablets by mouth daily.   Yes Historical Provider, MD  Multiple Vitamins-Minerals (PRESERVISION AREDS) CAPS Take 1 capsule by mouth daily.   Yes  Historical Provider, MD  NITROSTAT 0.4 MG SL tablet TAKE 1 TABLET UNDER TONGUE EVERY 5 MINUTES AS NEEDED FOR CHEST PAIN 10/12/14  Yes Lennette Bihari, MD  Omega-3 Fatty Acids (FISH OIL) 1000 MG CAPS Take 1 capsule by mouth 2 (two) times daily.   Yes Historical Provider, MD  traMADol (ULTRAM) 50 MG tablet Take 1 tablet (50 mg total) by mouth every 6 (six) hours as needed. 10/03/14  Yes Oswaldo Conroy, PA-C  warfarin (COUMADIN) 5 MG tablet Take 2.5-5 mg by mouth daily. Take 1/2 tablet daily. 1 tablet on saturday   Yes Historical Provider, MD   Allergies  Allergen Reactions  . Pravachol [Pravastatin Sodium] Other (See Comments)    Myalgias  . Latex Itching     Social History   Social History  . Marital Status: Married    Spouse Name: N/A  . Number of Children: N/A  . Years of Education: N/A   Social History Main Topics  . Smoking status: Never Smoker   . Smokeless tobacco: None  . Alcohol Use: No  . Drug Use: No  . Sexual Activity: Not Asked   Other Topics Concern  . None   Social History Narrative   She widowed ,2 children ,1 grandchild . 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 No tobacco use . No alcohol use. No chewing tobacco    History reviewed. No pertinent family history. - non-contributory given age  Wt Readings from Last 3 Encounters:  07/19/15 117 lb 8 oz (53.298 kg)  10/03/14 110 lb (49.896 kg)  07/16/14 114 lb 14.4 oz (52.118 kg)    PHYSICAL EXAM BP 126/60 mmHg  Pulse 71  Ht 5' (1.524 m)  Wt 117 lb 8 oz (53.298 kg)  BMI 22.95 kg/m2 General appearance: alert, cooperative, appears stated age, no distress and Very thin, but overall healthy appearing. Answers questions appropriately A&O x 3 HEENT: [place]/AT, EOMI, MMM, anicteric sclera Neck: no adenopathy, no carotid bruit, no JVD, supple, symmetrical, trachea midline and thyroid not enlarged, symmetric, no tenderness/mass/nodules Lungs: CTAB, normal percussion bilaterally and Nonlabored, good air  movement Heart: irregularly irregular rhythm; Normal S1 and S2. no S3 or S4; 1/6 low pitch, c-d SEM @ RUSB; no click and no rub Abdomen: soft, non-tender; bowel sounds normal; no masses, no organomegaly Extremities: extremities normal, atraumatic, no cyanosis or edema and venous stasis dermatitis noted Pulses: 2+ and symmetric Neurologic: Grossly normal    Adult ECG Report  Rate: 71 ;  Rhythm: atrial fibrillation and Normal rate.; normal axis, intervals and durations. Poor septal R-wave progression and low voltage in limb leads.  Narrative Interpretation: Compared to previous EKGs, PVCs no longer present.   Other studies Reviewed: Additional studies/ records that were reviewed today include:  Recent Labs:   Lab Results  Component Value Date   CREATININE 0.60 10/03/2014   No results found for: CHOL, HDL, LDLCALC, LDLDIRECT, TRIG, CHOLHDL   ASSESSMENT / PLAN: Problem List Items Addressed This Visit    Hyperlipemia - Primary (Chronic)   Relevant Orders   EKG 12-Lead (Completed)   High risk NST  08/2009, pt refused diagnostic heart cath, EF >55% by echo 08/2009 (Chronic)    No anginal symptoms. She does not wish to pursue invasive evaluations. Continue medical management with beta blocker. She did not do well with pravastatin. This point I think we can probably avoid extra medications.      Essential hypertension (Chronic)    Well-controlled on current dose of combination beta blocker-HCTZ.      Relevant Orders   EKG 12-Lead (Completed)   Chronic atrial fibrillation (HCC) (Chronic)    Relatively asymptomatic. Rate controlled on beta blocker. Anticoagulated with warfarin.  This patients CHA2DS2-VASc Score and unadjusted Ischemic Stroke Rate (% per year) is equal to 4.8 % stroke rate/year from a score of 4  Above score calculated as 1 point each if present [CHF, HTN, DM, Vascular=MI/PAD/Aortic Plaque, Age if 65-74, or Female] Above score calculated as 2 points each if present  [Age > 75, or Stroke/TIA/TE]       Relevant Orders   EKG 12-Lead (Completed)   Chronic anticoagulation - with warfarin (Chronic)    INR is being followed by her primary physician.      Bilateral lower extremity edema (Chronic)    She does have mild varicose veins and spider veins. A little discoloration to suggest venous stasis. Her daughter actually wears compression stockings, so we talked about getting her to wear them as well. She'll start off with the support stockings and see if that helps, but I would like to see if we can increase to moderate compression stockings. Her daughter is going to check to see if she has any that will fit, if not we will provide a prescription.         Current medicines are reviewed at length with the patient today. (+/- concerns) n/a The following changes have been made: n/a  CONTACT OFFICE IF PRESCRIPTION IS NEED FOR MID SUPPORT COMPRESSION STOCKING KNEE HI.  NO CHANGE WITH CURRENT MEDICATIONS.  Your physician wants you to follow-up in 12 MONTHS WITH DR Brytney Somes.  Studies Ordered:   Orders Placed This Encounter  Procedures  . EKG 12-Lead      Marykay Lex, M.D., M.S. Interventional Cardiologist   Pager # [phone]

## 2015-07-19 NOTE — Patient Instructions (Signed)
CONTACT OFFICE IF PRESCRIPTION IS NEED FOR MID SUPPORT COMPRESSION STOCKING KNEE HI.  NO CHANGE WITH CURRENT MEDICATIONS.  Your physician wants you to follow-up in 12 MONTHS WITH DR HARDING.  You will receive a reminder letter in the mail two months in advance. If you don't receive a letter, please call our office to schedule the follow-up appointment.

## 2015-07-21 ENCOUNTER — Encounter: Payer: Self-pay | Admitting: Cardiology

## 2015-07-21 DIAGNOSIS — R600 Localized edema: Secondary | ICD-10-CM | POA: Insufficient documentation

## 2015-07-21 NOTE — Assessment & Plan Note (Signed)
No anginal symptoms. She does not wish to pursue invasive evaluations. Continue medical management with beta blocker. She did not do well with pravastatin. This point I think we can probably avoid extra medications.

## 2015-07-21 NOTE — Assessment & Plan Note (Signed)
Relatively asymptomatic. Rate controlled on beta blocker. Anticoagulated with warfarin.  This patients CHA2DS2-VASc Score and unadjusted Ischemic Stroke Rate (% per year) is equal to 4.8 % stroke rate/year from a score of 4  Above score calculated as 1 point each if present [CHF, HTN, DM, Vascular=MI/PAD/Aortic Plaque, Age if 65-74, or Female] Above score calculated as 2 points each if present [Age > 75, or Stroke/TIA/TE]

## 2015-07-21 NOTE — Assessment & Plan Note (Signed)
Well-controlled on current dose of combination beta blocker-HCTZ.

## 2015-07-21 NOTE — Assessment & Plan Note (Signed)
INR is being followed by her primary physician.

## 2015-07-21 NOTE — Assessment & Plan Note (Signed)
She does have mild varicose veins and spider veins. A little discoloration to suggest venous stasis. Her daughter actually wears compression stockings, so we talked about getting her to wear them as well. She'll start off with the support stockings and see if that helps, but I would like to see if we can increase to moderate compression stockings. Her daughter is going to check to see if she has any that will fit, if not we will provide a prescription.

## 2016-01-19 ENCOUNTER — Telehealth: Payer: Self-pay | Admitting: Cardiology

## 2016-01-19 NOTE — Telephone Encounter (Signed)
[facility] appointment on 01/26/16 with Dr Herbie BaltimoreHarding.  [facility] for Dr Elissa HeftyHarding's schedule on 01/26/16 .

## 2016-01-19 NOTE — Telephone Encounter (Signed)
Call back number listed is not correct. I placed call to Dr. Carolyn Stareamachandran's office and was transferred to Pat's number. Left message on her voicemail to call office.  Direct number for Dennie Bibleat is [phone]

## 2016-01-19 NOTE — Telephone Encounter (Signed)
I left message on Pat's identified voicemail that records had been received and appt scheduled for July 27 at 10:45.  I left message I would contact pt with appt information.  I spoke with pt and gave her appt information. Records given to medical records.

## 2016-01-19 NOTE — Telephone Encounter (Signed)
New message     The pt needs to seen sooner than August 18 th with a PA I did not see a sooner appointment and Dr. Herbie BaltimoreHarding did not have anything available til September so the office is asking to try and get the pt in before August 18 th

## 2016-01-19 NOTE — Telephone Encounter (Signed)
Spoke with Diamond BiblePat at Dr. Carolyn Stareamachandran's office. Pt was seen in office yesterday. Dr. Nicholos Johnsamachandran would like pt seen for evaluation of shortness of breath and pedal edema. Records have been faxed to our office. I spoke with scheduling and appt made for pt to see Dr. Herbie BaltimoreHarding on 01/26/16 at 10:45.

## 2016-01-26 ENCOUNTER — Ambulatory Visit (INDEPENDENT_AMBULATORY_CARE_PROVIDER_SITE_OTHER): Payer: Medicare Other | Admitting: Cardiology

## 2016-01-26 ENCOUNTER — Inpatient Hospital Stay (HOSPITAL_COMMUNITY)
Admission: AD | Admit: 2016-01-26 | Discharge: 2016-02-03 | DRG: 291 | Disposition: A | Discharge status: Skilled Nursing Facility | Payer: Medicare Other | Source: Ambulatory Visit | Attending: Cardiology | Admitting: Cardiology

## 2016-01-26 ENCOUNTER — Encounter (HOSPITAL_COMMUNITY): Payer: Self-pay | Admitting: General Practice

## 2016-01-26 ENCOUNTER — Encounter: Payer: Self-pay | Admitting: Cardiology

## 2016-01-26 VITALS — BP 109/70 | HR 80 | Ht 62.0 in | Wt 114.0 lb

## 2016-01-26 DIAGNOSIS — I959 Hypotension, unspecified: Secondary | ICD-10-CM | POA: Diagnosis not present

## 2016-01-26 DIAGNOSIS — I482 Chronic atrial fibrillation, unspecified: Secondary | ICD-10-CM

## 2016-01-26 DIAGNOSIS — I10 Essential (primary) hypertension: Secondary | ICD-10-CM | POA: Diagnosis not present

## 2016-01-26 DIAGNOSIS — I272 Other secondary pulmonary hypertension: Secondary | ICD-10-CM | POA: Diagnosis present

## 2016-01-26 DIAGNOSIS — R601 Generalized edema: Secondary | ICD-10-CM | POA: Diagnosis not present

## 2016-01-26 DIAGNOSIS — E46 Unspecified protein-calorie malnutrition: Secondary | ICD-10-CM | POA: Diagnosis present

## 2016-01-26 DIAGNOSIS — R9439 Abnormal result of other cardiovascular function study: Secondary | ICD-10-CM

## 2016-01-26 DIAGNOSIS — Z7901 Long term (current) use of anticoagulants: Secondary | ICD-10-CM | POA: Diagnosis not present

## 2016-01-26 DIAGNOSIS — N183 Chronic kidney disease, stage 3 (moderate): Secondary | ICD-10-CM | POA: Diagnosis present

## 2016-01-26 DIAGNOSIS — I130 Hypertensive heart and chronic kidney disease with heart failure and stage 1 through stage 4 chronic kidney disease, or unspecified chronic kidney disease: Secondary | ICD-10-CM | POA: Diagnosis present

## 2016-01-26 DIAGNOSIS — J9811 Atelectasis: Secondary | ICD-10-CM | POA: Diagnosis present

## 2016-01-26 DIAGNOSIS — E785 Hyperlipidemia, unspecified: Secondary | ICD-10-CM

## 2016-01-26 DIAGNOSIS — Z79899 Other long term (current) drug therapy: Secondary | ICD-10-CM

## 2016-01-26 DIAGNOSIS — Z888 Allergy status to other drugs, medicaments and biological substances status: Secondary | ICD-10-CM

## 2016-01-26 DIAGNOSIS — I081 Rheumatic disorders of both mitral and tricuspid valves: Secondary | ICD-10-CM | POA: Diagnosis present

## 2016-01-26 DIAGNOSIS — E876 Hypokalemia: Secondary | ICD-10-CM | POA: Diagnosis present

## 2016-01-26 DIAGNOSIS — Z91040 Latex allergy status: Secondary | ICD-10-CM

## 2016-01-26 DIAGNOSIS — R931 Abnormal findings on diagnostic imaging of heart and coronary circulation: Secondary | ICD-10-CM

## 2016-01-26 DIAGNOSIS — I5033 Acute on chronic diastolic (congestive) heart failure: Secondary | ICD-10-CM | POA: Diagnosis present

## 2016-01-26 DIAGNOSIS — Z6821 Body mass index (BMI) 21.0-21.9, adult: Secondary | ICD-10-CM

## 2016-01-26 DIAGNOSIS — R0602 Shortness of breath: Secondary | ICD-10-CM | POA: Diagnosis present

## 2016-01-26 DIAGNOSIS — I2510 Atherosclerotic heart disease of native coronary artery without angina pectoris: Secondary | ICD-10-CM | POA: Diagnosis present

## 2016-01-26 DIAGNOSIS — I509 Heart failure, unspecified: Secondary | ICD-10-CM | POA: Diagnosis not present

## 2016-01-26 DIAGNOSIS — Z8249 Family history of ischemic heart disease and other diseases of the circulatory system: Secondary | ICD-10-CM | POA: Diagnosis not present

## 2016-01-26 LAB — CBC
HEMATOCRIT: 32.8 % — AB (ref 36.0–46.0)
Hemoglobin: 10.5 g/dL — ABNORMAL LOW (ref 12.0–15.0)
MCH: 25.7 pg — ABNORMAL LOW (ref 26.0–34.0)
MCHC: 32 g/dL (ref 30.0–36.0)
MCV: 80.2 fL (ref 78.0–100.0)
PLATELETS: 248 10*3/uL (ref 150–400)
RBC: 4.09 MIL/uL (ref 3.87–5.11)
RDW: 17 % — AB (ref 11.5–15.5)
WBC: 5.9 10*3/uL (ref 4.0–10.5)

## 2016-01-26 LAB — COMPREHENSIVE METABOLIC PANEL
ALBUMIN: 2.9 g/dL — AB (ref 3.5–5.0)
ALT: 21 U/L (ref 14–54)
AST: 34 U/L (ref 15–41)
Alkaline Phosphatase: 119 U/L (ref 38–126)
Anion gap: 8 (ref 5–15)
BILIRUBIN TOTAL: 1.3 mg/dL — AB (ref 0.3–1.2)
BUN: 28 mg/dL — AB (ref 6–20)
CHLORIDE: 106 mmol/L (ref 101–111)
CO2: 22 mmol/L (ref 22–32)
CREATININE: 0.83 mg/dL (ref 0.44–1.00)
Calcium: 9.3 mg/dL (ref 8.9–10.3)
GFR calc Af Amer: >60 mL/min (ref >60)
GLUCOSE: 87 mg/dL (ref 65–99)
POTASSIUM: 4 mmol/L (ref 3.5–5.1)
Sodium: 136 mmol/L (ref 135–145)
TOTAL PROTEIN: 6.4 g/dL — AB (ref 6.5–8.1)

## 2016-01-26 LAB — PROTIME-INR
INR: 2.35
Prothrombin Time: 26.1 seconds — ABNORMAL HIGH (ref 11.4–15.2)

## 2016-01-26 LAB — BRAIN NATRIURETIC PEPTIDE: B Natriuretic Peptide: 636 pg/mL — ABNORMAL HIGH (ref 0.0–100.0)

## 2016-01-26 MED ORDER — METOPROLOL TARTRATE 12.5 MG HALF TABLET
12.5000 mg | ORAL_TABLET | Freq: Two times a day (BID) | ORAL | Status: DC
  Administered 2016-01-26 – 2016-01-31 (×10): 12.5 mg via ORAL
  Filled 2016-01-26 (×11): qty 1

## 2016-01-26 MED ORDER — SODIUM CHLORIDE 0.9% FLUSH: 3.0000 mL | INTRAVENOUS | Status: DC | PRN

## 2016-01-26 MED ORDER — SODIUM CHLORIDE 0.9 % IV SOLN: 250.0000 mL | INTRAVENOUS | Status: DC | PRN

## 2016-01-26 MED ORDER — NITROGLYCERIN 0.4 MG SL SUBL: 0.4000 mg | SUBLINGUAL_TABLET | SUBLINGUAL | Status: DC | PRN

## 2016-01-26 MED ORDER — ONDANSETRON HCL 4 MG/2ML IJ SOLN: 4.0000 mg | Freq: Four times a day (QID) | INTRAMUSCULAR | Status: DC | PRN

## 2016-01-26 MED ORDER — SODIUM CHLORIDE 0.9% FLUSH
3.0000 mL | Freq: Two times a day (BID) | INTRAVENOUS | Status: DC
  Administered 2016-01-26 – 2016-02-03 (×16): 3 mL via INTRAVENOUS

## 2016-01-26 MED ORDER — LORATADINE 10 MG PO TABS
10.0000 mg | ORAL_TABLET | Freq: Every day | ORAL | Status: DC
  Administered 2016-01-27 – 2016-02-03 (×8): 10 mg via ORAL
  Filled 2016-01-26 (×8): qty 1

## 2016-01-26 MED ORDER — FUROSEMIDE 10 MG/ML IJ SOLN
80.0000 mg | Freq: Two times a day (BID) | INTRAMUSCULAR | Status: DC
  Administered 2016-01-26 – 2016-01-31 (×10): 80 mg via INTRAVENOUS
  Filled 2016-01-26 (×9): qty 8

## 2016-01-26 MED ORDER — WARFARIN - PHARMACIST DOSING INPATIENT
Freq: Every day | Status: DC
  Administered 2016-01-28: 18:00:00

## 2016-01-26 MED ORDER — ACETAMINOPHEN 325 MG PO TABS
650.0000 mg | ORAL_TABLET | ORAL | Status: DC | PRN
  Administered 2016-01-29 – 2016-02-02 (×3): 650 mg via ORAL
  Filled 2016-01-26 (×3): qty 2

## 2016-01-26 NOTE — Assessment & Plan Note (Signed)
Will need a nutrition consultation to help fully assess this.  I suspect she'll likely need nutritional supplementation based on nutrition consultation. I wonder anasarca can be partly related to hypoalbuminemia.

## 2016-01-26 NOTE — Assessment & Plan Note (Addendum)
Appears to be rate controlled on current dose of Lopressor. This has not been an issue for her in the past.  She has been a symptomatically with long-standing A. Fib.  Will monitor on telemetry to ensure no additional arrhythmias or tachycardia A. fib.  This patients CHA2DS2-VASc Score and unadjusted Ischemic Stroke Rate (% per year) is equal to 4.8 % stroke rate/year from a score of 5 Above score calculated as 1 point each if present [CHF, HTN, DM, Vascular=MI/PAD/Aortic Plaque, Age if 84-74, or Female]; Above score calculated as 2 points each if present [Age > 75, or Stroke/TIA/TE] --> she is currently on warfarin. Would continue current home dose.

## 2016-01-26 NOTE — Assessment & Plan Note (Addendum)
82 year old woman with probably coronary disease, but no recent evaluation since 2014 who now has significant bilateral lower extremity edema all the way up to the sacral pitting edema consistent with anasarca. I really can't be sure what the true audiology is apparently noted she is not responding to oral Lasix. It is hard to tell of this is truly cardiac as she is not really having PND and orthopnea symptoms suggest left heart failure.  I think at this point we are obligated to admit her to the hospital in order to adequately diurese her and monitor her renal function. I don't have any labs that were recently checked so we will need to restart with labs. Plan: Admit to telemetry bed (need to know if she is having rapid A. fib).  Will initiate IV Lasix, start with 80 mg IV twice a day for the first day and likely titrate down based on renal function and electrolyte.  May need to use Zaroxolyn in addition to Lasix if he is not responsive.  Will write for [place] wraps at least in the knees down with SCDs while in bed.  Check 2-D echocardiogram to exclude new onset heart failure either combined or simply diastolic as etiology.  We'll also recheck BNP level as well as serum protein levels.  Nutrition evaluation to exclude protein malnutrition/hypoalbuminemia as a possible etiology for her edema.  We will also need to potentially consider renal consult if there is concern for protein wasting. At this point I don't see an indication unless function worsens.

## 2016-01-26 NOTE — Progress Notes (Signed)
Spoke with PA who said to leave out the foley for now and see how the patient tolerated the IV lasix.

## 2016-01-26 NOTE — Progress Notes (Signed)
 PATIENT: Diamond Thomas MRN: [id number] DOD:10/15/1924  DATE OF SERVICE: 07/17/2014  PCP: RAMACHANDRAN,AJITH, MD  Clinic Note: Chief Complaint  Patient presents with  . Follow-up    SOB and Petal edema  . Shortness of Breath  . Edema    ankles- legs.    HPI: Diamond Thomas is a 82 y.o. female with a PMH below who presents today for delayed annual followup of atrial fibrillation.  She is a patient of Dr. Ramachandran. When diagnosed with A. Fib back in 2011, she had a moderate to high risk abnormal Myoview stress test showing medium sized moderate intensity perfusion defect in the basal inferior and inferior wall with moderate reversibility. She at that time Noted that she would not want to undergo coronary angiography. Her EF has been normal by echocardiogram and Myoview. She had a brief hospitalization in February of 2014 for chest pain thought to be musculoskeletal in nature. Since September 2014, I have been following her annually.   I last saw her in January of this year. She was doing well with no complaints. Maybe some mild edema in the legs. Nothing significant.   Interval History: She presents today, urgent clinic follow-up visit after being all by her PCP for last several months with worsening lower TIMI edema. Essentially she has anasarca up to her waist with pitting edema. This is been building up for several months and she's not been responsive to oral Lasix. She was seen by Dr. Ramachandran on July 19 and 4 for evaluation of cardiology for potential cardiac etiology for her edema. She has been on 40 mg of Lasix with minimal urine output and by his scale was weighing 130 pounds which is 20 pounds higher when she was here in January. She is going on for maybe 3 months now. It is not clear what initiated the onset of symptoms or anything in particular. She has not necessarily noted any worsening of orthopnea or PND. She states that she still sleeps on one pillow. She does get  little out of breath when she walks around her walker, but has noted that her legs feel very heavy with walking is very sitting in chair today. She was off of the metoprolol/HCTZ and put on metoprolol alone and Lasix was started. Apparently she was on 80 mg Lasix and that was reduced under 40 mg because she was "dehydrated" per her daughter. She lives at home and is cared for alternating nights by either her daughter or son who spent the night there with her. They have been paying close attention to her over the last several months. She is trying to take nutrition supplementation because she has "low protein levels ". Apparently she had some blood work done by Dr. Ramachandran including a BNP which I do not have either the chemistry panel or the BNP results with me here today.  Otherwise from a cardiac standpoint, she denies any sensation of rapid irregular heartbeats to suggest exacerbation of her atrial fibrillation. She really denies any chest tightness or pressure rest or exertion. No major bruising with warfarin. Occasionally may wake up a little short of breath but has really not had PND or orthopnea. No sig be/near-syncope J/amaurosis fugax. She is basically feels he tired as more exercise intolerance than usual.  When she gets tired and fatigued she does feel somewhat dizzy, but she denies any TIA or amaurosis fugax symptoms. She also denies any melena, hematochezia or hematuria.  No significant nosebleeds.  She'll denies any   claudication symptoms.  When I saw her back in January, she is relatively active, but now has very limited mobility.  Past Medical History:  Diagnosis Date  . CHF (congestive heart failure) (HCC)   . Chronic atrial fibrillation (HCC)    controlled on metoprolol and anticoagulated with Coumadin(Dr Ramachandran) .  . H/O echocardiogram 09/20/2009   EF greater than 55%, no wall motion, bilateral atrial dilation.  Mild aortic sclerosis with no stenosis  . History of  nuclear stress test 09/20/2009   Myoview : meduim sized, moderate intensity perfusion defect in the basal inferior wall with moderate reversibility thought to be High Risk scan; deferred invasive evaluation based on patient wishes.  . Hyperlipemia   . Hypertension    Allergies  Allergen Reactions  . Pravachol [Pravastatin Sodium] Other (See Comments)    Myalgias  . Latex Itching   No current outpatient prescriptions on file.  Social History   Social History Narrative   She widowed ,2 children ,1 grandchild . 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 No tobacco use . No alcohol use. No chewing tobacco    Family History  Problem Relation Age of Onset  . Hypertension Mother   . Stomach cancer Father    Given her age and comorbidities, noncontributory.   ROS: A comprehensive Review of Systems - Negative with the exception of mild positives noted above in history of present illness.  Review of Systems  Constitutional: Positive for malaise/fatigue. Negative for weight loss (Roughly 20 pound weight gain since last visit).  HENT: Negative for nosebleeds.   Respiratory: Positive for shortness of breath (With exertion). Negative for cough.   Cardiovascular: Positive for leg swelling. Negative for claudication.  Gastrointestinal: Negative for blood in stool and melena.       Increasing abdominal girth/swelling  Genitourinary: Negative for hematuria.  Musculoskeletal: Positive for joint pain. Negative for myalgias.  Neurological: Positive for dizziness.       Poor balance.  Endo/Heme/Allergies: Does not bruise/bleed easily.  Psychiatric/Behavioral: Positive for memory loss (Minimal). The patient does not have insomnia.   All other systems reviewed and are negative.  Not weighed today.  @ PCP office - 130 lbs.     PHYSICAL EXAM BP 109/70   Pulse 80   Ht 5' 2" (1.575 m)   Wt 114 lb (51.7 kg)   BMI 20.85 kg/m  General appearance: alert, cooperative, appears stated age,  very frail chronically ill-appearing. She has notable anasarca from waist down, from the waist up she is thin and frail. Less talkative than usual, slower to respond. However she does answer questions appropriately.A&O x 3 HEENT: [place]/AT, EOMI, MMM, anicteric sclera Neck: no adenopathy, no carotid bruit, supple, symmetrical, trachea midline and thyroid not enlarged, symmetric, no tenderness/mass/nodules; she has significant JVD dilated up to the angle of jaw with cannon a waves/ pulsatile jugular veins. Lungs: clear to auscultation bilaterally, normal percussion bilaterally and Nonlabored, good air movement Heart: irregularly irregular rhythm,normal S1 and S2. no S3 or S4;  1/6 low pitch, c-d SEM @ RUSB; no click and no rub Abdomen: soft, non-tender; bowel sounds normal; no masses,  no organomegaly; the overall abdominal girth does seem to be increased and from the umbilicus down there is notable pitting edema especially sacral edema. Extremities: extremities normal, atraumatic, no cyanosis; 4+ pitting edema at the ankles and feet with at least 3+ pitting edema up to the abdomen. Consistent with anasarca. Venous stasis changes noted. Pulses: 2+ and symmetric in upper   extremities, not palpable in lotion or edema. Neurologic: Grossly normal; CN II-XII grossly intact  EKG:Performed today: No  Recent Labs: None available   ASSESSMENT / PLAN: Anasarca 82-year-old woman with probably coronary disease, but no recent evaluation since 2014 who now has significant bilateral lower extremity edema all the way up to the sacral pitting edema consistent with anasarca. I really can't be sure what the true audiology is apparently noted she is not responding to oral Lasix. It is hard to tell of this is truly cardiac as she is not really having PND and orthopnea symptoms suggest left heart failure.  I think at this point we are obligated to admit her to the hospital in order to adequately diurese her and monitor her  renal function. I don't have any labs that were recently checked so we will need to restart with labs. Plan: Admit to telemetry bed (need to know if she is having rapid A. fib).  Will initiate IV Lasix, start with 80 mg IV twice a day for the first day and likely titrate down based on renal function and electrolyte.  May need to use Zaroxolyn in addition to Lasix if he is not responsive.  Will write for Unaboot wraps at least in the knees down with SCDs while in bed.  Check 2-D echocardiogram to exclude new onset heart failure either combined or simply diastolic as etiology.  We'll also recheck BNP level as well as serum protein levels.  Nutrition evaluation to exclude protein malnutrition/hypoalbuminemia as a possible etiology for her edema.  We will also need to potentially consider renal consult if there is concern for protein wasting. At this point I don't see an indication unless function worsens.  Chronic atrial fibrillation (HCC) Appears to be rate controlled on current dose of Lopressor. This has not been an issue for her in the past.  She has been a symptomatically with long-standing A. Fib.  Will monitor on telemetry to ensure no additional arrhythmias or tachycardia A. fib.  This patients CHA2DS2-VASc Score and unadjusted Ischemic Stroke Rate (% per year) is equal to 4.8 % stroke rate/year from a score of 5 Above score calculated as 1 point each if present [CHF, HTN, DM, Vascular=MI/PAD/Aortic Plaque, Age if 65-74, or Female]; Above score calculated as 2 points each if present [Age > 75, or Stroke/TIA/TE] --> she is currently on warfarin. Would continue current home dose.  Essential hypertension Not really major issue. Normal blood pressure with low-dose beta blocker. She currently has 25 mg once a day Toprol tartrate written. We will change it to 12.5 twice a day  High risk NST 08/2009, pt refused diagnostic heart cath, EF >55% by echo 08/2009 She quite clearly probably has  coronary artery disease and may have exacerbation of that. She may have had a silent MI. I had another frank discussion with her and her daughter today. She still would not want invasive evaluations with heart catheterization etc. However she would be agreeable to noninvasive studies such as echocardiogram.  Plan: Order 2-D echocardiogram to reassess EF and filling pressures. We'll also look for new regional wall motion abnormality which would make an ischemic etiology more likely.  Protein malnutrition (HCC) - at this point I'm not exactly sure what her malnutrition status is.  Will need a nutrition consultation to help fully assess this.  I suspect she'll likely need nutritional supplementation based on nutrition consultation. I wonder anasarca can be partly related to hypoalbuminemia.  No orders of the defined types   were placed in this encounter.   We will order H2 blocker for GI prophylaxis, and continue warfarin for A. fib anticoagulation/DVT prophylaxis.   [facility]    David Harding, M.D., M.S. Interventional Cardiologist   Pager # [phone] 

## 2016-01-26 NOTE — Assessment & Plan Note (Signed)
Not really major issue. Normal blood pressure with low-dose beta blocker. She currently has 25 mg once a day Toprol tartrate written. We will change it to 12.5 twice a day

## 2016-01-26 NOTE — Patient Instructions (Signed)
Dr Herbie Baltimore is admitting you to [facility]. You are to report to the admitting office.

## 2016-01-26 NOTE — Progress Notes (Signed)
Ortho tech paged about pt's order for [employer]. Will continue to monitor pt.

## 2016-01-26 NOTE — Assessment & Plan Note (Signed)
She quite clearly probably has coronary artery disease and may have exacerbation of that. She may have had a silent MI. I had another frank discussion with her and her daughter today. She still would not want invasive evaluations with heart catheterization etc. However she would be agreeable to noninvasive studies such as echocardiogram.  Plan: Order 2-D echocardiogram to reassess EF and filling pressures. We'll also look for new regional wall motion abnormality which would make an ischemic etiology more likely.

## 2016-01-26 NOTE — H&P (Signed)
[name] MRN: [id number] Diamond Thomas  DATE OF SERVICE: 07/17/2014  PCP: Georgianne Fick, MD  Clinic Note: Chief Complaint  Patient presents with  . Follow-up    SOB and Petal edema  . Shortness of Breath  . Edema    ankles- legs.    HPI: Diamond Thomas is a 82 y.o. female with a PMH below who presents today for delayed annual followup of atrial fibrillation.  She is a patient of Dr. Nicholos Johns. When diagnosed with A. Fib back in 2011, she had a moderate to high risk abnormal Myoview stress test showing medium sized moderate intensity perfusion defect in the basal inferior and inferior wall with moderate reversibility. She at that time Noted that she would not want to undergo coronary angiography. Her EF has been normal by echocardiogram and Myoview. She had a brief hospitalization in February of 2014 for chest pain thought to be musculoskeletal in nature. Since September 2014, I have been following her annually.   I last saw her in January of this year. She was doing well with no complaints. Maybe some mild edema in the legs. Nothing significant.   Interval History: She presents today, urgent clinic follow-up visit after being all by her PCP for last several months with worsening lower TIMI edema. Essentially she has anasarca up to her waist with pitting edema. This is been building up for several months and she's not been responsive to oral Lasix. She was seen by Dr. Nicholos Johns on July 19 and 4 for evaluation of cardiology for potential cardiac etiology for her edema. She has been on 40 mg of Lasix with minimal urine output and by his scale was weighing 130 pounds which is 20 pounds higher when she was here in January. She is going on for maybe 3 months now. It is not clear what initiated the onset of symptoms or anything in particular. She has not necessarily noted any worsening of orthopnea or PND. She states that she still sleeps on one pillow. She does get  little out of breath when she walks around her walker, but has noted that her legs feel very heavy with walking is very sitting in chair today. She was off of the metoprolol/HCTZ and put on metoprolol alone and Lasix was started. Apparently she was on 80 mg Lasix and that was reduced under 40 mg because she was "dehydrated" per her daughter. She lives at home and is cared for alternating nights by either her daughter or son who spent the night there with her. They have been paying close attention to her over the last several months. She is trying to take nutrition supplementation because she has "low protein levels ". Apparently she had some blood work done by Dr. Nicholos Johns including a BNP which I do not have either the chemistry panel or the BNP results with me here today.  Otherwise from a cardiac standpoint, she denies any sensation of rapid irregular heartbeats to suggest exacerbation of her atrial fibrillation. She really denies any chest tightness or pressure rest or exertion. No major bruising with warfarin. Occasionally may wake up a little short of breath but has really not had PND or orthopnea. No sig be/near-syncope J/amaurosis fugax. She is basically feels he tired as more exercise intolerance than usual.  When she gets tired and fatigued she does feel somewhat dizzy, but she denies any TIA or amaurosis fugax symptoms. She also denies any melena, hematochezia or hematuria.  No significant nosebleeds.  She'll denies any  claudication symptoms.  When I saw her back in January, she is relatively active, but now has very limited mobility.  Past Medical History:  Diagnosis Date  . CHF (congestive heart failure) (HCC)   . Chronic atrial fibrillation (HCC)    controlled on metoprolol and anticoagulated with Coumadin(Dr Ramachandran) .  [facility] H/O echocardiogram 09/20/2009   EF greater than 55%, no wall motion, bilateral atrial dilation.  Mild aortic sclerosis with no stenosis  . History of  nuclear stress test 09/20/2009   Myoview : meduim sized, moderate intensity perfusion defect in the basal inferior wall with moderate reversibility thought to be High Risk scan; deferred invasive evaluation based on patient wishes.  . Hyperlipemia   . Hypertension    Allergies  Allergen Reactions  . Pravachol [Pravastatin Sodium] Other (See Comments)    Myalgias  . Latex Itching   No current outpatient prescriptions on file.  Social History   Social History Narrative   She widowed ,2 children ,1 grandchild . 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 No tobacco use . No alcohol use. No chewing tobacco    Family History  Problem Relation Age of Onset  . Hypertension Mother   . Stomach cancer Father    Given her age and comorbidities, noncontributory.   ROS: A comprehensive Review of Systems - Negative with the exception of mild positives noted above in history of present illness.  Review of Systems  Constitutional: Positive for malaise/fatigue. Negative for weight loss (Roughly 20 pound weight gain since last visit).  HENT: Negative for nosebleeds.   Respiratory: Positive for shortness of breath (With exertion). Negative for cough.   Cardiovascular: Positive for leg swelling. Negative for claudication.  Gastrointestinal: Negative for blood in stool and melena.       Increasing abdominal girth/swelling  Genitourinary: Negative for hematuria.  Musculoskeletal: Positive for joint pain. Negative for myalgias.  Neurological: Positive for dizziness.       Poor balance.  Endo/Heme/Allergies: Does not bruise/bleed easily.  Psychiatric/Behavioral: Positive for memory loss (Minimal). The patient does not have insomnia.   All other systems reviewed and are negative.  Not weighed today.  @ PCP office - 130 lbs.     PHYSICAL EXAM BP 109/70   Pulse 80   Ht 5\' 2"  (1.575 m)   Wt 114 lb (51.7 kg)   BMI 20.85 kg/m  General appearance: alert, cooperative, appears stated age,  very frail chronically ill-appearing. She has notable anasarca from waist down, from the waist up she is thin and frail. Less talkative than usual, slower to respond. However she does answer questions appropriately.A&O x 3 HEENT: [place]/AT, EOMI, MMM, anicteric sclera Neck: no adenopathy, no carotid bruit, supple, symmetrical, trachea midline and thyroid not enlarged, symmetric, no tenderness/mass/nodules; she has significant JVD dilated up to the angle of jaw with cannon a waves/ pulsatile jugular veins. Lungs: clear to auscultation bilaterally, normal percussion bilaterally and Nonlabored, good air movement Heart: irregularly irregular rhythm,normal S1 and S2. no S3 or S4;  1/6 low pitch, c-d SEM @ RUSB; no click and no rub Abdomen: soft, non-tender; bowel sounds normal; no masses,  no organomegaly; the overall abdominal girth does seem to be increased and from the umbilicus down there is notable pitting edema especially sacral edema. Extremities: extremities normal, atraumatic, no cyanosis; 4+ pitting edema at the ankles and feet with at least 3+ pitting edema up to the abdomen. Consistent with anasarca. Venous stasis changes noted. Pulses: 2+ and symmetric in upper  extremities, not palpable in lotion or edema. Neurologic: Grossly normal; CN II-XII grossly intact  [id number] today: No  Recent Labs: None available   ASSESSMENT / PLAN: Anasarca 82 year old woman with probably coronary disease, but no recent evaluation since 2014 who now has significant bilateral lower extremity edema all the way up to the sacral pitting edema consistent with anasarca. I really can't be sure what the true audiology is apparently noted she is not responding to oral Lasix. It is hard to tell of this is truly cardiac as she is not really having PND and orthopnea symptoms suggest left heart failure.  I think at this point we are obligated to admit her to the hospital in order to adequately diurese her and monitor her  renal function. I don't have any labs that were recently checked so we will need to restart with labs. Plan: Admit to telemetry bed (need to know if she is having rapid A. fib).  Will initiate IV Lasix, start with 80 mg IV twice a day for the first day and likely titrate down based on renal function and electrolyte.  May need to use Zaroxolyn in addition to Lasix if he is not responsive.  Will write for Unaboot wraps at least in the knees down with SCDs while in bed.  Check 2-D echocardiogram to exclude new onset heart failure either combined or simply diastolic as etiology.  We'll also recheck BNP level as well as serum protein levels.  Nutrition evaluation to exclude protein malnutrition/hypoalbuminemia as a possible etiology for her edema.  We will also need to potentially consider renal consult if there is concern for protein wasting. At this point I don't see an indication unless function worsens.  Chronic atrial fibrillation (HCC) Appears to be rate controlled on current dose of Lopressor. This has not been an issue for her in the past.  She has been a symptomatically with long-standing A. Fib.  Will monitor on telemetry to ensure no additional arrhythmias or tachycardia A. fib.  This patients CHA2DS2-VASc Score and unadjusted Ischemic Stroke Rate (% per year) is equal to 4.8 % stroke rate/year from a score of 5 Above score calculated as 1 point each if present [CHF, HTN, DM, Vascular=MI/PAD/Aortic Plaque, Age if 95-74, or Female]; Above score calculated as 2 points each if present [Age > 75, or Stroke/TIA/TE] --> she is currently on warfarin. Would continue current home dose.  Essential hypertension Not really major issue. Normal blood pressure with low-dose beta blocker. She currently has 25 mg once a day Toprol tartrate written. We will change it to 12.5 twice a day  High risk NST 08/2009, pt refused diagnostic heart cath, EF >55% by echo 08/2009 She quite clearly probably has  coronary artery disease and may have exacerbation of that. She may have had a silent MI. I had another frank discussion with her and her daughter today. She still would not want invasive evaluations with heart catheterization etc. However she would be agreeable to noninvasive studies such as echocardiogram.  Plan: Order 2-D echocardiogram to reassess EF and filling pressures. We'll also look for new regional wall motion abnormality which would make an ischemic etiology more likely.  Protein malnutrition (HCC) - at this point I'm not exactly sure what her malnutrition status is.  Will need a nutrition consultation to help fully assess this.  I suspect she'll likely need nutritional supplementation based on nutrition consultation. I wonder anasarca can be partly related to hypoalbuminemia.  No orders of the defined types  were placed in this encounter.   We will order H2 blocker for GI prophylaxis, and continue warfarin for A. fib anticoagulation/DVT prophylaxis.   Followup: [facility]    Bryan Lemma, M.D., M.S. Interventional Cardiologist   Pager # [phone]

## 2016-01-26 NOTE — Progress Notes (Signed)
ANTICOAGULATION CONSULT NOTE - Initial Consult  Pharmacy Consult for Warfarin Indication: atrial fibrillation  Allergies  Allergen Reactions  . Pravachol [Pravastatin Sodium] Other (See Comments)    Myalgias  . Latex Itching    Patient Measurements:    Vital Signs: Temp: 98.4 F (36.9 C) (07/27 1421) Temp Source: Oral (07/27 1421) BP: 128/80 (07/27 1421) Pulse Rate: 78 (07/27 1421)  Labs:  Recent Labs  01/26/16 1532  HGB 10.5*  HCT 32.8*  PLT 248  LABPROT 26.1*  INR 2.35    CrCl cannot be calculated (Patient's most recent lab result is older than the maximum 21 days allowed.).   Medical History: Past Medical History:  Diagnosis Date  . CHF (congestive heart failure) (HCC)   . Chronic atrial fibrillation (HCC)    controlled on metoprolol and anticoagulated with Coumadin(Dr Ramachandran) .  Marland Kitchen H/O echocardiogram 09/20/2009   EF greater than 55%, no wall motion, bilateral atrial dilation.  Mild aortic sclerosis with no stenosis  . History of nuclear stress test 09/20/2009   Myoview : meduim sized, moderate intensity perfusion defect in the basal inferior wall with moderate reversibility thought to be High Risk scan; deferred invasive evaluation based on patient wishes.  . Hyperlipemia   . Hypertension     Medications:  Prescriptions Prior to Admission  Medication Sig Dispense Refill Last Dose  . Acetaminophen (TYLENOL EXTRA STRENGTH PO) Take 1 tablet by mouth daily as needed.   Past Week at Unknown time  . Calcium Carb-Cholecalciferol (CALCIUM 600 + D PO) Take 1 tablet by mouth 2 (two) times daily.   01/26/2016 at Unknown time  . furosemide (LASIX) 40 MG tablet Take 40 mg by mouth daily.  2 01/26/2016 at Unknown time  . KLOR-CON M10 10 MEQ tablet Take 10 mEq by mouth daily.   01/25/2016 at Unknown time  . loratadine (CLARITIN) 10 MG tablet Take 10 mg by mouth daily.   01/25/2016 at Unknown time  . Magnesium 250 MG TABS Take 1 tablet by mouth daily.   01/26/2016 at  Unknown time  . metoprolol tartrate (LOPRESSOR) 25 MG tablet Take 25 mg by mouth daily.  2 01/26/2016 at 0900  . NITROSTAT 0.4 MG SL tablet TAKE 1 TABLET UNDER TONGUE EVERY 5 MINUTES AS NEEDED FOR CHEST PAIN 25 tablet 1 prn  . Omega-3 Fatty Acids (FISH OIL) 1000 MG CAPS Take 1 capsule by mouth 2 (two) times daily.   01/26/2016 at Unknown time  . triamcinolone cream (KENALOG) 0.1 %    01/25/2016 at Unknown time  . warfarin (COUMADIN) 2 MG tablet Take 2 mg by mouth every morning.   01/26/2016 at Unknown time   Scheduled:  . furosemide  80 mg Intravenous BID  . [START ON 01/27/2016] loratadine  10 mg Oral Daily  . metoprolol tartrate  12.5 mg Oral Q12H  . sodium chloride flush  3 mL Intravenous Q12H    Assessment: 82yo female with history of Afib on warfarin PTA presents with SOB and edema. Pharmacy is consulted to dose warfarin for atrial fibrillation.   PTA Warfarin Dose: 2mg /d with last dose 7/27  Goal of Therapy:  INR 2-3 Monitor platelets by anticoagulation protocol: Yes   Plan:  Hold warfarin tonight x1 Daily INR Monitor s/sx of bleeding  Arlean Hopping. Newman Pies, PharmD, BCPS Clinical Pharmacist Pager [phone] 01/26/2016,4:19 PM

## 2016-01-26 NOTE — Progress Notes (Addendum)
Pt came from Dr's office with daughter. Pt denies any pain. Pt and daughter made aware that they are in a camera room. Pt oriented to room and equipment. MD paged about patients arrival. Will cont to monitor pt.

## 2016-01-26 NOTE — Progress Notes (Signed)
Orthopedic Tech Progress Note Patient Details:  Diamond Thomas 07/21/1924 [id number]  Ortho Devices Type of Ortho Device: Roland Rack boot Ortho Device/Splint Location: bilateral unna boots Ortho Device/Splint Interventions: Application   Saul Fordyce 01/26/2016, 6:59 PM

## 2016-01-27 ENCOUNTER — Inpatient Hospital Stay (HOSPITAL_COMMUNITY): Payer: Medicare Other

## 2016-01-27 DIAGNOSIS — I10 Essential (primary) hypertension: Secondary | ICD-10-CM

## 2016-01-27 DIAGNOSIS — I482 Chronic atrial fibrillation: Secondary | ICD-10-CM

## 2016-01-27 DIAGNOSIS — I509 Heart failure, unspecified: Secondary | ICD-10-CM

## 2016-01-27 DIAGNOSIS — R601 Generalized edema: Secondary | ICD-10-CM

## 2016-01-27 LAB — BASIC METABOLIC PANEL
ANION GAP: 10 (ref 5–15)
BUN: 27 mg/dL — ABNORMAL HIGH (ref 6–20)
CHLORIDE: 103 mmol/L (ref 101–111)
CO2: 25 mmol/L (ref 22–32)
Calcium: 9.2 mg/dL (ref 8.9–10.3)
Creatinine, Ser: 0.89 mg/dL (ref 0.44–1.00)
GFR calc non Af Amer: 56 mL/min — ABNORMAL LOW (ref >60)
Glucose, Bld: 104 mg/dL — ABNORMAL HIGH (ref 65–99)
POTASSIUM: 3.6 mmol/L (ref 3.5–5.1)
SODIUM: 138 mmol/L (ref 135–145)

## 2016-01-27 LAB — BLOOD GAS, ARTERIAL
ACID-BASE EXCESS: 1.6 mmol/L (ref 0.0–2.0)
Bicarbonate: 25 mEq/L — ABNORMAL HIGH (ref 20.0–24.0)
DRAWN BY: 246101
FIO2: 0.21
O2 SAT: 91.6 %
PCO2 ART: 34.5 mmHg — AB (ref 35.0–45.0)
Patient temperature: 98.6
TCO2: 26 mmol/L (ref 0–100)
pH, Arterial: 7.473 — ABNORMAL HIGH (ref 7.350–7.450)
pO2, Arterial: 63.2 mmHg — ABNORMAL LOW (ref 80.0–100.0)

## 2016-01-27 LAB — URINALYSIS, ROUTINE W REFLEX MICROSCOPIC
Bilirubin Urine: NEGATIVE
GLUCOSE, UA: NEGATIVE mg/dL
Hgb urine dipstick: NEGATIVE
KETONES UR: NEGATIVE mg/dL
LEUKOCYTES UA: NEGATIVE
NITRITE: NEGATIVE
PROTEIN: NEGATIVE mg/dL
Specific Gravity, Urine: 1.011 (ref 1.005–1.030)
pH: 6.5 (ref 5.0–8.0)

## 2016-01-27 LAB — ECHOCARDIOGRAM COMPLETE
Height: 62 in
Weight: 1972.8 oz

## 2016-01-27 LAB — PROTIME-INR
INR: 2.51
PROTHROMBIN TIME: 27.6 s — AB (ref 11.4–15.2)

## 2016-01-27 MED ORDER — ENSURE ENLIVE PO LIQD
237.0000 mL | Freq: Three times a day (TID) | ORAL | Status: DC
  Administered 2016-01-27 – 2016-02-03 (×15): 237 mL via ORAL

## 2016-01-27 MED ORDER — WARFARIN SODIUM 2 MG PO TABS
2.0000 mg | ORAL_TABLET | Freq: Every day | ORAL | Status: DC
  Administered 2016-01-27 – 2016-01-29 (×3): 2 mg via ORAL
  Filled 2016-01-27 (×3): qty 1

## 2016-01-27 MED ORDER — METOLAZONE 5 MG PO TABS
2.5000 mg | ORAL_TABLET | Freq: Once | ORAL | Status: AC
  Administered 2016-01-27: 2.5 mg via ORAL
  Filled 2016-01-27: qty 1

## 2016-01-27 NOTE — Progress Notes (Signed)
I had ortho tech come back up and check patients una boots at request of daughter. Pt denies any pain in feet and numbess. Distal pulses palpable and cap refill WNL. WIll continue to assess

## 2016-01-27 NOTE — Progress Notes (Signed)
PT having frequent urges to urinate every 5 to 10 minutes and only voiding an un measurable amount up to 50 cc. Bladder scan done twice immediately after voiding. One bladder scan showed 168 cc the next bladder scan after void showed 180 cc. I spoke with Tonna Corner, PA and she stated to place foley cath. I spoke with pt's daughter Rosey Bath and informed her of above situation and she agreed that was in the best interest. Pt stated she feels like she "cant get everything out of her bladder." Peri-care performed and foley cath placed using sterile technique. 400 cc immediately emptied and pt stated she "felt relief." UA sent as ordered. Bag secure and placed below bladder.

## 2016-01-27 NOTE — Progress Notes (Signed)
Ortho tech came to assess una boots and felt everything was ok. Will continue to monitor. Pts daughter felt that to be helpful

## 2016-01-27 NOTE — Progress Notes (Signed)
Patient has been voiding little amounts often, bladder scanned her prior to putting her on the [facility] and she has 50-75cc and emptied for 75cc with a little urine from previous added to amount, will continue to monitor, no other changes noted.

## 2016-01-27 NOTE — Progress Notes (Addendum)
Hospital Problem List     Principal Problem:   Anasarca Active Problems:   Essential hypertension   High risk NST 08/2009, pt refused diagnostic heart cath, EF >55% by echo 08/2009   Chronic atrial fibrillation (HCC)   Protein malnutrition (HCC) - at this point I'm not exactly sure what her malnutrition status is.     Patient Profile:   Primary Cardiologist: Dr. Herbie Baltimore  82 yo female w/ PMH of chronic diastolic CHF (EF 16% by echo in 2011), chronic atrial fibrillation, HTN, HLD, and known high-risk NST (has refused cath) who was seen in the office on 7/27 for worsening dyspnea and lower extremity edema. Directly admitted to [facility] for further diuresis.   Subjective   Breathing is at baseline. Reported some numbness sensation in her feet following placement of Unna boots, improved with elevation of feet. Says she is urinating frequently.  Inpatient Medications    . furosemide  80 mg Intravenous BID  . loratadine  10 mg Oral Daily  . metoprolol tartrate  12.5 mg Oral Q12H  . sodium chloride flush  3 mL Intravenous Q12H  . Warfarin - Pharmacist Dosing Inpatient   Does not apply q1800    Vital Signs    Vitals:   01/26/16 2011 01/26/16 2152 01/27/16 0348 01/27/16 0722  BP: (!) 121/57 128/64 121/68 124/69  Pulse: 98 (!) 104 97 89  Resp: 18  (!) 25 (!) 27  Temp: 98.2 F (36.8 C)  98.7 F (37.1 C)   TempSrc: Oral  Oral   SpO2: 98%  95% 94%  Weight:   123 lb 4.8 oz (55.9 kg)   Height:        Intake/Output Summary (Last 24 hours) at 01/27/16 1096 Last data filed at 01/27/16 0830  Gross per 24 hour  Intake              600 ml  Output             1275 ml  Net             -675 ml   Filed Weights   01/27/16 0348  Weight: 123 lb 4.8 oz (55.9 kg)    Physical Exam    General: Elderly Caucasian female appearing in no acute distress. Head: Normocephalic, atraumatic.  Neck: Supple without bruits, JVD elevated to 9cm. Lungs:  Resp regular and unlabored, CTA without wheezing  or rales. Heart: Irregularly irregular, S1, S2, no S3, S4, or murmur; no rub. Abdomen: Soft, non-tender, non-distended with normoactive bowel sounds. No hepatomegaly. No rebound/guarding. No obvious abdominal masses. Extremities: No clubbing or cyanosis, 2+ pitting edema bilaterally. Distal pedal pulses are 2+ bilaterally. Unna boots in place. Neuro: Alert and oriented X 3. Moves all extremities spontaneously. Psych: Normal affect.  Labs    CBC  Recent Labs  01/26/16 1532  WBC 5.9  HGB 10.5*  HCT 32.8*  MCV 80.2  PLT 248   Basic Metabolic Panel  Recent Labs  01/26/16 1532 01/27/16 0424  NA 136 138  K 4.0 3.6  CL 106 103  CO2 22 25  GLUCOSE 87 104*  BUN 28* 27*  CREATININE 0.83 0.89  CALCIUM 9.3 9.2   Liver Function Tests  Recent Labs  01/26/16 1532  AST 34  ALT 21  ALKPHOS 119  BILITOT 1.3*  PROT 6.4*  ALBUMIN 2.9*     Telemetry    Atrial fibrillation, HR in 80's - 90's.   ECG    No new  tracings.   Cardiac Studies and Radiology    Dg Chest Port 1 View: Result Date: 01/27/2016 CLINICAL DATA:  Shortness of breath on exertion. EXAM: PORTABLE CHEST 1 VIEW COMPARISON:  Radiographs of October 03, 2014. FINDINGS: Stable cardiomediastinal silhouette. No pneumothorax is noted. Interval development of moderate right pleural effusion with probable underlying atelectasis or infiltrate. Minimal left basilar subsegmental atelectasis is noted. Bony thorax is unremarkable. IMPRESSION: Interval development of moderate right pleural effusion with probable underlying atelectasis or infiltrate. Electronically Signed   By: Lupita Raider, M.D.   On: 01/27/2016 07:22   Echocardiogram: Pending  Assessment & Plan    1. Anasarca - seen in the office on 7/27 and noted to have severe bilateral lower extremity edema, consistent with anasarca.  - BNP elevated to 636 on admission. CXR showing interval development of moderate right pleural effusion with probable underlying  atelectasis or infiltrate. Echo is pending. - started on IV Lasix 80mg  BID (creatinine stable at 0.89). May need to use Zaroxolyn in addition to Lasix if he is not responsive, but continue with current dose of IV Lasix for now. Recorded urine output of -1.2L yesterday, net -675 mL. Continue to obtain daily weights and strict I&O's. - Had Unna boots placed on admission. Reported mild numbness in her feet bilaterally, improved with elevation. She has good pulses and sensation is intact.  2. Chronic atrial fibrillation - This patients CHA2DS2-VASc Score and unadjusted Ischemic Stroke Rate (% per year) is equal to 11.2 % stroke rate/year from a score of 7 (CHF, HTN, DM, Vascular, Female, Age (2)). Continue Coumadin per pharmacy consult.  - continue PTA Lopressor.   3. Essential hypertension - BP has been 109/57 - 128/80 in the past 24 hours. - continue current medication regimen.   4. High risk NST 08/2009, pt refused diagnostic heart cath, EF >55% by echo 08/2009 - She likely has known CAD but has refused a cardiac catheterization. This was again discussed with the patient and her daughter by Dr. Herbie Baltimore in the office and she does not want invasive evaluations with heart catheterization etc.   5. Protein malnutrition  - nutrition consult pending.  - concern her anasarca may be partly related to hypoalbuminemia.   Signed, Ellsworth Lennox , PA-C 9:22 AM 01/27/2016 Pager: [phone] The patient has been seen in conjunction with Randall An, PAC. All aspects of care have been considered and discussed. The patient has been personally interviewed, examined, and all clinical data has been reviewed.   Pleasant elderly female in no acute distress admitted with anasarca.  1 L diuresis overnight. Laboratory data is stable. Echo is pending.  Plan is to continue diuresis to improve overall fine status while monitoring her kidney function and clinical status.  Overall, diuresis has been  mild. Will give one of Zaroxolyn with next IV lasix.

## 2016-01-27 NOTE — Plan of Care (Signed)
Problem: Education: Goal: Knowledge of [facility] Education information/materials will improve Outcome: Progressing Welcome packet given to patient along with information regarding Living with Heart Failure, reviewed some but patient having issues with voiding since Lasix was given prior to my shift, will continue to give her little bits of information and reinforce material covered.

## 2016-01-27 NOTE — Progress Notes (Signed)
For the course of my shift, pt has gotten progressively restless and fidgety. She has constantly complained of feeling like she needs to have a BM. Pt has had 3 soft stools with myself today. Daughter reports pt has IBS and had large diarrhea episode PTA yesterday. She is markedly weak and refuses to use bedpan so we have gotten her to [facility] with 2-3 person assist. Bowel sounds hyperactive in all quadrants, abd nontender and not distended. While we have gotten her OOB, she has desated to 88% on RA but came back up to the mid to upper 90s. I called Britney, PAto discuss patients course of the day and she thought it would be a good idea to check ABG. Respiratory notified. Will continue to monitor

## 2016-01-27 NOTE — Plan of Care (Signed)
Problem: Safety: Goal: Ability to remain free from injury will improve Outcome: Progressing Reviewed importance of using her call light for assistance and not to get OOB on her own, call light and phone are in her bed and her personal items are within reach, also explained that her bed alarm would be on while she was in the hospital especially at night, patient verbalized understanding, will continue to monitor.

## 2016-01-27 NOTE — Care Management Note (Addendum)
Case Management Note  Patient Details  Name: JOHNATHAN CALLERY MRN: [id number] Date of Birth: July 12, 1924  Subjective/Objective:Pt presented for worsening SOB and was a direct admit from the MD office. Pt is from home alone and has support of daughter. Pt has DME RW at home.                     Action/Plan: CM will continue to f/u for disposition needs. We did discuss the option of having HHRN in the home. Pt to think about services.    Expected Discharge Date:                  Expected Discharge Plan:  Home w Home Health Services  In-House Referral:  NA  Discharge planning Services  CM Consult  Post Acute Care Choice:   N/A Choice offered to:   N/A  DME Arranged:   N/A DME Agency:   N/A  HH Arranged:   N/A HH Agency:   N/A  Status of Service: COMPLETED If discussed at Long Length of Stay Meetings, dates discussed:    Additional Comments: 1034 02-03-16 Tomi Bamberger, RN,BSN [phone] Pt will d/c to [facility] once stable for d/c. No further needs from CM at this time.   [address] Tomi Bamberger, [place] [phone] Plan will be for SNF once stable for d/c. CSW assisting with disposition needs.   Gala Lewandowsky, RN 01/27/2016, 3:50 PM

## 2016-01-27 NOTE — Progress Notes (Signed)
Echocardiogram 2D Echocardiogram has been performed.  Diamond Thomas 01/27/2016, 9:44 AM

## 2016-01-27 NOTE — Progress Notes (Signed)
Report received in patient's room via Revonda Standard RN using [employer], reviewed orers, labs, VS, meds and patient's general condition, assumed care of patient.

## 2016-01-27 NOTE — Progress Notes (Signed)
Initial Nutrition Assessment  DOCUMENTATION CODES:   Severe malnutrition in context of chronic illness  INTERVENTION:    Ensure Enlive po TID between meals, each supplement provides 350 kcal and 20 grams of protein  NUTRITION DIAGNOSIS:   Malnutrition related to chronic illness as evidenced by severe depletion of body fat, severe depletion of muscle mass.  GOAL:   Patient will meet greater than or equal to 90% of their needs  MONITOR:   PO intake, Supplement acceptance, Weight trends, I & O's, Labs  REASON FOR ASSESSMENT:   Consult Assessment of nutrition requirement/status  ASSESSMENT:   82 yo female w/ PMH of chronic diastolic CHF (EF 35% by echo in 2011), chronic atrial fibrillation, HTN, HLD, and known high-risk NST (has refused cath) who was seen in the office on 7/27 for worsening dyspnea and lower extremity edema. Directly admitted to [facility] for further diuresis.   Low albumin is likely related to dilutional effect of fluid overload.  Current weight is above usual weight due to fluid overload.  Patient reports good intake PTA, she eats 3 meals per day. She tries to avoid sodium in her diet. From dietary recall, she is not consuming enough protein. Suspect intake of protein at home is </= 30 gm per day. Discussed good dietary sources of protein with patient and her daughter. Patient drinks one protein supplement shake (brought by daughter) per day at most. Encouraged patient to drink Ensure Enlive PO supplement or equivalent TID. Nutrition-Focused physical exam completed. Findings are severe fat depletion, severe muscle depletion, and moderate edema.  Patient with severe PCM. Currently on a 2 gm sodium diet, consuming 75-100% of meals.   Diet Order:  Diet 2 gram sodium Room service appropriate? Yes; Fluid consistency: Thin  Skin:  Reviewed, no issues  Last BM:  7/27  Height:   Ht Readings from Last 1 Encounters:  01/26/16 5\' 2"  (1.575 m)    Weight:   Wt Readings  from Last 1 Encounters:  01/27/16 123 lb 4.8 oz (55.9 kg)    Ideal Body Weight:  50 kg  BMI:  Body mass index is 22.55 kg/m.  Estimated Nutritional Needs:   Kcal:  1500-1600  Protein:  75-85 gm  Fluid:  1.5 L  EDUCATION NEEDS:   Education needs addressed  Joaquin Courts, RD, LDN, CNSC Pager [phone] After Hours Pager [phone]

## 2016-01-27 NOTE — Care Management Important Message (Signed)
Important Message  Patient Details  Name: Diamond Thomas MRN: [id number] Date of Birth: [birth year]   Medicare Important Message Given:  Yes    Bernadette Hoit 01/27/2016, 10:40 AM

## 2016-01-27 NOTE — Progress Notes (Signed)
Upon morning assessment, pt c/o "a little numb" in her feet. Pedal pulses 1+ in both legs, toes warm, capilllary refill WNL. She is able to move all her toes. Una boots still on. I called ortho tech  To check and make sure they weren't too tight. He stated that Pts can have this problem when fluid is shifting around. He stated to elevate her feet and have her wiggle her toes frequently and that should help alleviate. I informed pt and we did those interventions. When [place] PA came to see her, I mentioned problem to her. She stated she would like to keep them on if at all possible and if the problem further existed, then we would readdress.Will continue to assess numbness

## 2016-01-27 NOTE — Progress Notes (Signed)
ANTICOAGULATION CONSULT NOTE - Follow Up Consult  Pharmacy Consult for Coumadin Indication: atrial fibrillation  Allergies  Allergen Reactions  . Pravachol [Pravastatin Sodium] Other (See Comments)    Myalgias  . Latex Itching    Patient Measurements: Height: 5\' 2"  (157.5 cm) Weight: 123 lb 4.8 oz (55.9 kg) IBW/kg (Calculated) : 50.1  Vital Signs: Temp: 98.7 F (37.1 C) (07/28 0348) Temp Source: Oral (07/28 0348) BP: 127/68 (07/28 1019) Pulse Rate: 89 (07/28 0722)  Labs:  Recent Labs  01/26/16 1532 01/27/16 0424  HGB 10.5*  --   HCT 32.8*  --   PLT 248  --   LABPROT 26.1* 27.6*  INR 2.35 2.51  CREATININE 0.83 0.89    Estimated Creatinine Clearance: 33.9 mL/min (by C-G formula based on SCr of 0.89 mg/dL).  Assessment:  Continues on Coumadin as prior to admission for afib.  INR remains therapeutic (2.51) on home dose of 2 mg daily.   Goal of Therapy:  INR 2-3 Monitor platelets by anticoagulation protocol: Yes   Plan:   Continue Coumadin 2 mg daily at 6pm.  Daily PT/INR for now.  Dennie Fetters, [place] Pager: [phone] 01/27/2016,1:01 PM

## 2016-01-28 LAB — BASIC METABOLIC PANEL
ANION GAP: 9 (ref 5–15)
BUN: 27 mg/dL — ABNORMAL HIGH (ref 6–20)
CALCIUM: 8.8 mg/dL — AB (ref 8.9–10.3)
CO2: 26 mmol/L (ref 22–32)
Chloride: 100 mmol/L — ABNORMAL LOW (ref 101–111)
Creatinine, Ser: 0.85 mg/dL (ref 0.44–1.00)
GFR calc non Af Amer: 59 mL/min — ABNORMAL LOW (ref >60)
Glucose, Bld: 107 mg/dL — ABNORMAL HIGH (ref 65–99)
Potassium: 3 mmol/L — ABNORMAL LOW (ref 3.5–5.1)
Sodium: 135 mmol/L (ref 135–145)

## 2016-01-28 LAB — PROTIME-INR
INR: 2.68
Prothrombin Time: 29.1 seconds — ABNORMAL HIGH (ref 11.4–15.2)

## 2016-01-28 MED ORDER — POTASSIUM CHLORIDE CRYS ER 20 MEQ PO TBCR
20.0000 meq | EXTENDED_RELEASE_TABLET | Freq: Once | ORAL | Status: AC
  Administered 2016-01-28: 20 meq via ORAL
  Filled 2016-01-28: qty 1

## 2016-01-28 MED ORDER — POTASSIUM CHLORIDE CRYS ER 20 MEQ PO TBCR
20.0000 meq | EXTENDED_RELEASE_TABLET | Freq: Two times a day (BID) | ORAL | Status: DC
  Administered 2016-01-29 – 2016-02-02 (×10): 20 meq via ORAL
  Filled 2016-01-28 (×11): qty 1

## 2016-01-28 MED ORDER — POTASSIUM CHLORIDE CRYS ER 20 MEQ PO TBCR
40.0000 meq | EXTENDED_RELEASE_TABLET | Freq: Three times a day (TID) | ORAL | Status: AC
  Administered 2016-01-28 (×3): 40 meq via ORAL
  Filled 2016-01-28 (×3): qty 2

## 2016-01-28 NOTE — Discharge Instructions (Signed)

## 2016-01-28 NOTE — Progress Notes (Signed)
   Notified by pharmacy of patient's hypokalemia while on IV Lasix 80 mg bid. Repletion ordered. Bmet is ordered for 7/30 AM.

## 2016-01-28 NOTE — Progress Notes (Signed)
Patient ID: JEARLINE LAPAN, female   DOB: Sep 29, 1924, 82 y.o.   MRN: [id number]    Patient Name: Diamond Thomas [facility] Date of Encounter: 01/28/2016     Principal Problem:   Anasarca Active Problems:   Essential hypertension   High risk NST 08/2009, pt refused diagnostic heart cath, EF >55% by echo 08/2009   Chronic atrial fibrillation (HCC)   Protein malnutrition (HCC) - at this point I'm not exactly sure what her malnutrition status is.     SUBJECTIVE  Dyspnea improved. No chest pain. Legs wrapped  CURRENT MEDS . feeding supplement (ENSURE ENLIVE)  237 mL Oral TID BM  . furosemide  80 mg Intravenous BID  . loratadine  10 mg Oral Daily  . metoprolol tartrate  12.5 mg Oral Q12H  . [START ON 01/29/2016] potassium chloride  20 mEq Oral BID  . potassium chloride  40 mEq Oral TID  . sodium chloride flush  3 mL Intravenous Q12H  . warfarin  2 mg Oral q1800  . Warfarin - Pharmacist Dosing Inpatient   Does not apply q1800    OBJECTIVE  Vitals:   01/28/16 0700 01/28/16 0800 01/28/16 0840 01/28/16 1433  BP:   103/80   Pulse: 93 99 95   Resp: 20 (!) 23 (!) 25   Temp:    98.1 F (36.7 C)  TempSrc:    Oral  SpO2: 95% 93% 93%   Weight:      Height:        Intake/Output Summary (Last 24 hours) at 01/28/16 1512 Last data filed at 01/28/16 1436  Gross per 24 hour  Intake              720 ml  Output             5300 ml  Net            -4580 ml   Filed Weights   01/27/16 0348 01/28/16 0317  Weight: 123 lb 4.8 oz (55.9 kg) 123 lb 12.8 oz (56.2 kg)    PHYSICAL EXAM  General: Pleasant, NAD. Neuro: Alert and oriented X 3. Moves all extremities spontaneously. Psych: Normal affect. HEENT:  Normal  Neck: Supple without bruits or JVD. Lungs:  Resp regular and unlabored, CTA. Heart: RRR no s3, s4, or murmurs. Abdomen: Soft, non-tender, non-distended, BS + x 4.  Extremities: No clubbing, cyanosis or edema. DP/PT/Radials 2+ and equal bilaterally.  Accessory Clinical  Findings  CBC  Recent Labs  01/26/16 1532  WBC 5.9  HGB 10.5*  HCT 32.8*  MCV 80.2  PLT 248   Basic Metabolic Panel  Recent Labs  01/27/16 0424 01/28/16 0304  NA 138 135  K 3.6 3.0*  CL 103 100*  CO2 25 26  GLUCOSE 104* 107*  BUN 27* 27*  CREATININE 0.89 0.85  CALCIUM 9.2 8.8*   Liver Function Tests  Recent Labs  01/26/16 1532  AST 34  ALT 21  ALKPHOS 119  BILITOT 1.3*  PROT 6.4*  ALBUMIN 2.9*   No results for input(s): LIPASE, AMYLASE in the last 72 hours. Cardiac Enzymes No results for input(s): CKTOTAL, CKMB, CKMBINDEX, TROPONINI in the last 72 hours. BNP Invalid input(s): POCBNP D-Dimer No results for input(s): DDIMER in the last 72 hours. Hemoglobin A1C No results for input(s): HGBA1C in the last 72 hours. Fasting Lipid Panel No results for input(s): CHOL, HDL, LDLCALC, TRIG, CHOLHDL, LDLDIRECT in the last 72 hours. Thyroid Function Tests No results for input(s): TSH, T4TOTAL,  T3FREE, THYROIDAB in the last 72 hours.  Invalid input(s): FREET3  TELE  Probable atrial fib with a CVR  Radiology/Studies  Dg Chest Port 1 View  Result Date: 01/27/2016 CLINICAL DATA:  Shortness of breath on exertion. EXAM: PORTABLE CHEST 1 VIEW COMPARISON:  Radiographs of October 03, 2014. FINDINGS: Stable cardiomediastinal silhouette. No pneumothorax is noted. Interval development of moderate right pleural effusion with probable underlying atelectasis or infiltrate. Minimal left basilar subsegmental atelectasis is noted. Bony thorax is unremarkable. IMPRESSION: Interval development of moderate right pleural effusion with probable underlying atelectasis or infiltrate. Electronically Signed   By: Lupita Raider, M.D.   On: 01/27/2016 07:22   ASSESSMENT AND PLAN  1. Acute on chronic diastolic heart failure - her dyspnea may be a bit better but not much. Will continue IV lasix. 2. Atrial fib with a CVR - her rates are well controlled. Continue current meds. 3.  Deconditioning - she seems quite frail to me. She will need PT at some point I think.  Gregg Taylor,M.D.  01/28/2016 3:12 PM

## 2016-01-28 NOTE — Progress Notes (Signed)
ANTICOAGULATION CONSULT NOTE - Follow Up Consult  Pharmacy Consult for Coumadin Indication: atrial fibrillation  Allergies  Allergen Reactions  . Pravachol [Pravastatin Sodium] Other (See Comments)    Myalgias  . Latex Itching    Patient Measurements: Height: 5\' 2"  (157.5 cm) Weight: 123 lb 12.8 oz (56.2 kg) IBW/kg (Calculated) : 50.1  Vital Signs: Temp: 97.9 F (36.6 C) (07/29 0317) Temp Source: Oral (07/29 0317) BP: 103/80 (07/29 0840) Pulse Rate: 95 (07/29 0840)  Labs:  Recent Labs  01/26/16 1532 01/27/16 0424 01/28/16 0304  HGB 10.5*  --   --   HCT 32.8*  --   --   PLT 248  --   --   LABPROT 26.1* 27.6* 29.1*  INR 2.35 2.51 2.68  CREATININE 0.83 0.89 0.85    Estimated Creatinine Clearance: 35.5 mL/min (by C-G formula based on SCr of 0.85 mg/dL).  Assessment:  Continues on Coumadin as prior to admission for afib.  INR remains therapeutic (2.68) on home dose of 2 mg daily.   No signs of bleeding were noted.  Patient was re-educated on warfarin and expressed understanding.   Goal of Therapy:  INR 2-3 Monitor platelets by anticoagulation protocol: Yes   Plan:   Continue Coumadin 2 mg daily at 6pm.  Daily PT/INR for now.  Monitor s/sx of bleeding.  Education completed.   Hillis Range, RPh Pager: [phone] 01/28/2016,10:29 AM

## 2016-01-29 LAB — BASIC METABOLIC PANEL
Anion gap: 7 (ref 5–15)
BUN: 31 mg/dL — ABNORMAL HIGH (ref 6–20)
CALCIUM: 9.1 mg/dL (ref 8.9–10.3)
CHLORIDE: 98 mmol/L — AB (ref 101–111)
CO2: 31 mmol/L (ref 22–32)
CREATININE: 0.88 mg/dL (ref 0.44–1.00)
GFR calc non Af Amer: 57 mL/min — ABNORMAL LOW (ref >60)
GLUCOSE: 105 mg/dL — AB (ref 65–99)
Potassium: 3.9 mmol/L (ref 3.5–5.1)
Sodium: 136 mmol/L (ref 135–145)

## 2016-01-29 LAB — PROTIME-INR
INR: 2.45
Prothrombin Time: 27 seconds — ABNORMAL HIGH (ref 11.4–15.2)

## 2016-01-29 NOTE — Progress Notes (Signed)
ANTICOAGULATION CONSULT NOTE - Follow Up Consult  Pharmacy Consult for Coumadin Indication: atrial fibrillation  Allergies  Allergen Reactions  . Pravachol [Pravastatin Sodium] Other (See Comments)    Myalgias  . Latex Itching    Patient Measurements: Height: 5\' 2"  (157.5 cm) Weight: 135 lb 9.6 oz (61.5 kg) IBW/kg (Calculated) : 50.1  Vital Signs: Temp: 97.7 F (36.5 C) (07/30 0535) Temp Source: Oral (07/30 0535) BP: 116/69 (07/30 0535) Pulse Rate: 91 (07/30 0535)  Labs:  Recent Labs  01/26/16 1532 01/27/16 0424 01/28/16 0304 01/29/16 0451  HGB 10.5*  --   --   --   HCT 32.8*  --   --   --   PLT 248  --   --   --   LABPROT 26.1* 27.6* 29.1* 27.0*  INR 2.35 2.51 2.68 2.45  CREATININE 0.83 0.89 0.85 0.88    Estimated Creatinine Clearance: 37.4 mL/min (by C-G formula based on SCr of 0.88 mg/dL).  Assessment:  Continues on Coumadin as prior to admission for afib.  INR remains therapeutic (2.45) on home dose of 2 mg daily.   No signs of bleeding were noted.   Goal of Therapy:  INR 2-3 Monitor platelets by anticoagulation protocol: Yes   Plan:   Continue Coumadin 2 mg daily at 6pm.  Daily PT/INR for now.  Monitor s/sx of bleeding.  Education completed on 7/29   Delila Spence D Pager: [phone] 01/29/2016,11:45 AM

## 2016-01-29 NOTE — Progress Notes (Signed)
PATIENT ID: Diamond Thomas is an 14F with chronic atrial fibrillation, chronic diastolic heart failure, hypertension, hyperlipidemia and CAD with a known high risk stress test refusing intervention here with acute on chronic heart failure.    SUBJECTIVE:  She continues to report shortness of breath that is unchanged.   PHYSICAL EXAM Vitals:   01/28/16 2248 01/29/16 0441 01/29/16 0500 01/29/16 0535  BP: (!) 109/49 (!) 95/59  116/69  Pulse: 96   91  Resp:  (!) 23  20  Temp:    97.7 F (36.5 C)  TempSrc:    Oral  SpO2:    93%  Weight:   135 lb 9.6 oz (61.5 kg)   Height:       General:  Frail, elderly woman in no acute distress. Neck: JVP above mandible at 45. Lungs:   Clear to auscultation bilaterally. No crackles, rhonchi or wheezes. Heart:   Regular rate and rhythm. No murmurs, rubs, or gallops. Abdomen:   Soft. Nontender, nondistended. Active bowel sounds Extremities:   Unna boots in place. Bilateral anasarca.  LABS: Lab Results  Component Value Date   TROPONINI <0.30 08/27/2012   Results for orders placed or performed during the hospital encounter of 01/26/16 (from the past 24 hour(s))  Basic metabolic panel     Status: Abnormal   Collection Time: 01/29/16  4:51 AM  Result Value Ref Range   Sodium 136 135 - 145 mmol/L   Potassium 3.9 3.5 - 5.1 mmol/L   Chloride 98 (L) 101 - 111 mmol/L   CO2 31 22 - 32 mmol/L   Glucose, Bld 105 (H) 65 - 99 mg/dL   BUN 31 (H) 6 - 20 mg/dL   Creatinine, Ser 1.61 0.44 - 1.00 mg/dL   Calcium 9.1 8.9 - 09.6 mg/dL   GFR calc non Af Amer 57 (L) >60 mL/min   GFR calc Af Amer >60 >60 mL/min   Anion gap 7 5 - 15  Protime-INR     Status: Abnormal   Collection Time: 01/29/16  4:51 AM  Result Value Ref Range   Prothrombin Time 27.0 (H) 11.4 - 15.2 seconds   INR 2.45     Intake/Output Summary (Last 24 hours) at 01/29/16 1038 Last data filed at 01/29/16 0700  Gross per 24 hour  Intake              720 ml  Output             1200 ml  Net              -480 ml    Telemetry:  Atrial fibrillation.  Rates <100 bpm.  Echo 01/27/16: Study Conclusions  - Left ventricle: The cavity size was normal. Wall thickness was   normal. Systolic function was normal. The estimated ejection   fraction was in the range of 60% to 65%. Wall motion was normal;   there were no regional wall motion abnormalities. The study is   not technically sufficient to allow evaluation of LV diastolic   function. - Ventricular septum: The contour showed diastolic flattening and   systolic flattening. - Aortic valve: Mildly calcified annulus. Trileaflet; mildly   calcified leaflets. - Mitral valve: Calcified annulus. There was mild regurgitation. - Left atrium: The atrium was moderately dilated. - Right ventricle: The cavity size was moderately to severely   dilated. Systolic function was mildly to moderately reduced. - Right atrium: The atrium was severely dilated. Central venous   pressure (est): 15  mm Hg. - Atrial septum: No defect or patent foramen ovale was identified. - Tricuspid valve: There was moderate regurgitation. - Pulmonary arteries: Systolic pressure was severely increased. PA   peak pressure: 70 mm Hg (S). - Pericardium, extracardiac: A trivial pericardial effusion was   identified.  Impressions:  - Normal LV wall thickness with LVEF 60-65%. Indeterminate   diastolic function in the setting of atrial fibrillation. LV   septal flattening noted consistent with RV pressure and volume   overload. Moderate left atrial enlargement. MAC with mild mitral   regurgitation. Mildly sclerotic aortic valve. Moderate to severe   RV enlargement with moderately decreased contraction. Severe   right atrial enlargement with elevated estimated CVP. At least   moderate tricuspid regurgitation noted with evidence of severe   pulmonary hypertension and PASP estimated 70 mmHg. Trivial   pericardial effusion.  ASSESSMENT AND PLAN:  Principal Problem:    Anasarca Active Problems:   Essential hypertension   High risk NST 08/2009, pt refused diagnostic heart cath, EF >55% by echo 08/2009   Chronic atrial fibrillation (HCC)   Protein malnutrition (HCC) - at this point I'm not exactly sure what her malnutrition status is.    # Chronic diastolic heart failure: # Severe pulmonary hypertension with mild-moderate RV failure: Diamond Thomas continues to diuresis on IV Lasix. Her renal function is stable. Echo this admission revealed PAS P of 70 mmHg, which is new from her echo in 2011. She has normal systolic function and they were unable to assess diastolic function due to atrial fibrillation.  Suspect that the etiology of her pulmonary hypertension is due to diastolic dysfunction. She has no smoking history and pulmonary embolism is unlikely given that she has been chronically anticoagulated with warfarin.  She is not interested in procedures such as RHC. - seen in the office on 7/27 and noted to have severe bilateral lower extremity edema, consistent with anasarca.  - BNP elevated to 636 on admission. CXR showing interval development of moderate right pleural effusion with probable underlying atelectasis or infiltrate.  - started on IV Lasix 80mg  BID (creatinine stable at 0.89). May need to use Zaroxolyn in addition to Lasix if he is not responsive, but continue with current dose of IV Lasix for now.  - Continue to obtain daily weights and strict I&O's. - Continue Unna boots  # Chronic atrial fibrillation: Rates are well-controlled.   Continue warfarin and metoprolol. - This patients CHA2DS2-VASc Score and unadjusted Ischemic Stroke Rate (% per year) is equal to 11.2 % stroke rate/year from a score of 7 (CHF, HTN, DM, Vascular, Female, Age (2)). Continue Coumadin per pharmacy consult.   #  Essential hypertension - BP well-controlled - continue current medication regimen.   #  High risk NST 08/2009, pt refused diagnostic heart cath, EF >55% by echo  08/2009.  LVEF remains >55%. - She likely has known CAD but has refused a cardiac catheterization. This was again discussed with the patient and her daughter by Dr. Herbie Baltimore in the office and she does not want invasive evaluations with heart catheterization etc.   #  Protein malnutrition  - Evaluated by nutrition and recommended Ensure Enlive tid - concern her anasarca may be partly related to hypoalbuminemia.    Aavya Shafer C. Duke Salvia, MD, [facility] 01/29/2016 10:38 AM

## 2016-01-30 LAB — PROTIME-INR
INR: 1.95
PROTHROMBIN TIME: 22.5 s — AB (ref 11.4–15.2)

## 2016-01-30 MED ORDER — WARFARIN SODIUM 1 MG PO TABS
3.0000 mg | ORAL_TABLET | Freq: Once | ORAL | Status: AC
  Administered 2016-01-30: 3 mg via ORAL
  Filled 2016-01-30: qty 1

## 2016-01-30 MED ORDER — WARFARIN SODIUM 2 MG PO TABS: 2.0000 mg | ORAL_TABLET | Freq: Every day | ORAL | Status: DC

## 2016-01-30 NOTE — Care Management Important Message (Signed)
Important Message  Patient Details  Name: Diamond Thomas MRN: [id number] Date of Birth: [birth year]   Medicare Important Message Given:  Yes    Bernadette Hoit 01/30/2016, 3:30 PM

## 2016-01-30 NOTE — Progress Notes (Signed)
Patient Name: Diamond Thomas Date of Encounter: 01/30/2016  Principal Problem:   Anasarca Active Problems:   Essential hypertension   High risk NST 08/2009, pt refused diagnostic heart cath, EF >55% by echo 08/2009   Chronic atrial fibrillation (HCC)   Protein malnutrition (HCC) - at this point I'm not exactly sure what her malnutrition status is.    Primary Cardiologist: Dr. Herbie Baltimore Patient Profile: Diamond Thomas is a 82 year old female with chronic Afib, chronic diastolic CHF, HTN, HLD and CAD. She had a right risk stress test in 2011, refused intervention. Admitted on 01/26/16 with acute on chronic CHF. Presented to the office with pitting edema and admitted for IV diuresis.   SUBJECTIVE: Feels tired, says her breathing is better. Has some pain in her left knee.   OBJECTIVE Vitals:   01/29/16 0535 01/29/16 1330 01/29/16 1931 01/30/16 0437  BP: 116/69 (!) 104/54 104/60 (!) 114/53  Pulse: 91 97 90 94  Resp: 20 (!) 27 (!) 24 16  Temp: 97.7 F (36.5 C) 98.2 F (36.8 C) 97.6 F (36.4 C) 97.6 F (36.4 C)  TempSrc: Oral Oral Oral Oral  SpO2: 93% 92% 96% 94%  Weight:    123 lb 9.6 oz (56.1 kg)  Height:        Intake/Output Summary (Last 24 hours) at 01/30/16 8527 Last data filed at 01/30/16 0813  Gross per 24 hour  Intake              720 ml  Output             3500 ml  Net            -2780 ml   Filed Weights   01/28/16 0317 01/29/16 0500 01/30/16 0437  Weight: 123 lb 12.8 oz (56.2 kg) 135 lb 9.6 oz (61.5 kg) 123 lb 9.6 oz (56.1 kg)    PHYSICAL EXAM General: Well developed, well nourished,elderly female in no acute distress. Head: Normocephalic, atraumatic.  Neck: Supple without bruits, jaw JVD. Lungs:  Resp regular and unlabored, CTA. Heart: RRR, S1, S2, no S3, S4, or murmur; no rub. Abdomen: Soft, non-tender, non-distended, BS + x 4.  Extremities: No clubbing, cyanosis, +3 BLE edema.  Neuro: Alert and oriented X 3. Moves all extremities spontaneously. Psych: Normal  affect.  LABS: INR: Recent Labs  01/30/16 0556  INR 1.95   Basic Metabolic Panel: Recent Labs  01/28/16 0304 01/29/16 0451  NA 135 136  K 3.0* 3.9  CL 100* 98*  CO2 26 31  GLUCOSE 107* 105*  BUN 27* 31*  CREATININE 0.85 0.88  CALCIUM 8.8* 9.1   BNP:  B Natriuretic Peptide  Date/Time Value Ref Range Status  01/26/2016 03:32 PM 636.0 (H) 0.0 - 100.0 pg/mL Final    Current Facility-Administered Medications:  .  0.9 %  sodium chloride infusion, 250 mL, Intravenous, PRN, Marykay Lex, MD .  acetaminophen (TYLENOL) tablet 650 mg, 650 mg, Oral, Q4H PRN, Marykay Lex, MD, 650 mg at 01/29/16 1038 .  feeding supplement (ENSURE ENLIVE) (ENSURE ENLIVE) liquid 237 mL, 237 mL, Oral, TID BM, Marykay Lex, MD, 237 mL at 01/29/16 2019 .  furosemide (LASIX) injection 80 mg, 80 mg, Intravenous, BID, Marykay Lex, MD, 80 mg at 01/30/16 7824 .  loratadine (CLARITIN) tablet 10 mg, 10 mg, Oral, Daily, Lennart Pall [place], [place], 10 mg at 01/30/16 [phone] .  metoprolol tartrate (LOPRESSOR) tablet 12.5 mg, 12.5 mg, Oral, Q12H, Piedad Climes  Herbie Baltimore, MD, 12.5 mg at 01/30/16 1610 .  nitroGLYCERIN (NITROSTAT) SL tablet 0.4 mg, 0.4 mg, Sublingual, Q5 min PRN, Ellsworth Lennox, PA .  ondansetron [facility]) injection 4 mg, 4 mg, Intravenous, Q6H PRN, Marykay Lex, MD .  potassium chloride SA (K-DUR,KLOR-CON) CR tablet 20 mEq, 20 mEq, Oral, BID, Ryan M Dunn, PA-C, 20 mEq at 01/30/16 9604 .  sodium chloride flush (NS) 0.9 % injection 3 mL, 3 mL, Intravenous, Q12H, Marykay Lex, MD, 3 mL at 01/30/16 0839 .  sodium chloride flush (NS) 0.9 % injection 3 mL, 3 mL, Intravenous, PRN, Marykay Lex, MD .  warfarin (COUMADIN) tablet 2 mg, 2 mg, Oral, q1800, Scarlett Presto, RPH, 2 mg at 01/29/16 1723 .  Warfarin - Pharmacist Dosing Inpatient, , Does not apply, q1800, Marquita Palms, RPH   TELE: Afib, rate controlled in 90's.          Echo: 01/27/16 Study Conclusions  - Left ventricle: The cavity size  was normal. Wall thickness was   normal. Systolic function was normal. The estimated ejection   fraction was in the range of 60% to 65%. Wall motion was normal;   there were no regional wall motion abnormalities. The study is   not technically sufficient to allow evaluation of LV diastolic   function. - Ventricular septum: The contour showed diastolic flattening and   systolic flattening. - Aortic valve: Mildly calcified annulus. Trileaflet; mildly   calcified leaflets. - Mitral valve: Calcified annulus. There was mild regurgitation. - Left atrium: The atrium was moderately dilated. - Right ventricle: The cavity size was moderately to severely   dilated. Systolic function was mildly to moderately reduced. - Right atrium: The atrium was severely dilated. Central venous   pressure (est): 15 mm Hg. - Atrial septum: No defect or patent foramen ovale was identified. - Tricuspid valve: There was moderate regurgitation. - Pulmonary arteries: Systolic pressure was severely increased. PA   peak pressure: 70 mm Hg (S). - Pericardium, extracardiac: A trivial pericardial effusion was   identified.  Impressions:  - Normal LV wall thickness with LVEF 60-65%. Indeterminate   diastolic function in the setting of atrial fibrillation. LV   septal flattening noted consistent with RV pressure and volume   overload. Moderate left atrial enlargement. MAC with mild mitral   regurgitation. Mildly sclerotic aortic valve. Moderate to severe   RV enlargement with moderately decreased contraction. Severe   right atrial enlargement with elevated estimated CVP. At least   moderate tricuspid regurgitation noted with evidence of severe   pulmonary hypertension and PASP estimated 70 mmHg. Trivial   pericardial effusion.   Current Medications:  . feeding supplement (ENSURE ENLIVE)  237 mL Oral TID BM  . furosemide  80 mg Intravenous BID  . loratadine  10 mg Oral Daily  . metoprolol tartrate  12.5 mg Oral  Q12H  . potassium chloride  20 mEq Oral BID  . sodium chloride flush  3 mL Intravenous Q12H  . warfarin  2 mg Oral q1800  . Warfarin - Pharmacist Dosing Inpatient   Does not apply q1800      ASSESSMENT AND PLAN: Principal Problem:   Anasarca Active Problems:   Essential hypertension   High risk NST 08/2009, pt refused diagnostic heart cath, EF >55% by echo 08/2009   Chronic atrial fibrillation (HCC)   Protein malnutrition (HCC) - at this point I'm not exactly sure what her malnutrition status is.   1. Acute on chronic diastolic  CHF: On day 5 of IV diuresis with  Lasix BID. Negative 8 liters for admission. Echo this admission shows normal LVEF of 60-65%, LV septal flattening consistent with elevated RV pressure and volume overload. Right atrium severely dilated. PA peak pressure is . Suspect that the etiology of her pulmonary hypertension is due to diastolic dysfunction. She has no smoking history and pulmonary embolism is unlikely given that she has been chronically anticoagulated with warfarin.  She is not interested in procedures such as RHC.  BNP elevated to 636 on admission. CXR showing interval development of moderate right pleural effusion with probable underlying atelectasis or infiltrate.Still volume overloaded on exam. Continue IV lasix, creatinine is 0.88.   2. Chronic atrial fibrillation: Rates are well-controlled. Continue warfarin and metoprolol.  This patients CHA2DS2-VASc Score and unadjusted Ischemic Stroke Rate (% per year) is equal to 11.2 % stroke rate/year from a score of 7(CHF, HTN, DM, Vascular, Female, Age (2)). Continue Coumadin per pharmacy consult.   3. HTN: Well controlled on beta blocker, continue same.   4. High risk NST 08/2009, pt refused diagnostic heart cath, EF >55% by echo 08/2009.  LVEF remains >55%: She likely has known CAD but has refused a cardiac catheterization. This was again discussed with the patient and her daughter by Dr. Herbie Baltimore in  the office and she doesnot want invasive evaluations with heart catheterization etc.   5.  Protein malnutrition: Evaluated by nutrition and recommended Ensure Enlive tid. Concern heranasarca may be partly related to hypoalbuminemia.   Signed, Little Ishikawa , NP 9:22 AM 01/30/2016 Pager [phone]   Patient seen and examined. Agree with assessment and plan. Good diuresis since admission with net - 9405 output. Breathing better but still mildly dysneic. Will continue iv furosemide today and transition to oral tomorrow. Will add lisinopril 2.5 mg daily. On coumadin with INR today 1.95.   Lennette Bihari, MD, [facility] 01/30/2016 11:04 AM

## 2016-01-30 NOTE — Evaluation (Signed)
Physical Therapy Evaluation Patient Details Name: Diamond Thomas MRN: [id number] DOB: 06-29-1925 Today's Date: 01/30/2016   History of Present Illness  82 y.o. female with h/o HTN, a-fib, CHF admitted with anasarca.   Clinical Impression  Pt admitted with above diagnosis. Pt currently with functional limitations due to the deficits listed below (see PT Problem List). +2 assist for bed to recliner transfer with RW, posterior lean in sit and standing. SNF recommended if family unable to provide 24* assist.  Pt will benefit from skilled PT to increase their independence and safety with mobility to allow discharge to the venue listed below.       Follow Up Recommendations Supervision/Assistance - 24 hour;SNF (SNF if family not able to assist 24*)    Equipment Recommendations  None recommended by PT    Recommendations for Other Services       Precautions / Restrictions Precautions Precautions: Fall Precaution Comments: pt initially reported no falls in past year then later stated she had a fall a few months ago Restrictions Weight Bearing Restrictions: No      Mobility  Bed Mobility Overal bed mobility: Needs Assistance Bed Mobility: Supine to Sit     Supine to sit: Max assist     General bed mobility comments: max A to raise trunk, pt with posterior lean in sitting, max verbal/manual cues to come near neutral  Transfers Overall transfer level: Needs assistance Equipment used: Rolling walker (2 wheeled) Transfers: Sit to/from [employer] Sit to Stand: +2 safety/equipment;Max assist Stand pivot transfers: +2 safety/equipment;Mod assist       General transfer comment: pt fearful of falling with sitting on EOB, pt more at ease with 2nd person present to assist with transfer, Posterior lean in standing, not able to come to full upright position despite verbal/manual cues, increased time to take a few pivotal steps to recliner with +2 assist for balance and safety  with posterior lean throughout  Ambulation/Gait                Stairs            Wheelchair Mobility    Modified Rankin (Stroke Patients Only)       Balance Overall balance assessment: Needs assistance Sitting-balance support: Bilateral upper extremity supported;Feet supported Sitting balance-Leahy Scale: Poor Sitting balance - Comments: posterior lean Postural control: Posterior lean Standing balance support: Bilateral upper extremity supported Standing balance-Leahy Scale: Zero Standing balance comment: posterior lean                             Pertinent Vitals/Pain Pain Assessment: 0-10 Pain Score: 8  Pain Location: R knee Pain Descriptors / Indicators: Sore Pain Intervention(s): Limited activity within patient's tolerance;Monitored during session;Premedicated before session    Home Living Family/patient expects to be discharged to:: Private residence Living Arrangements: Alone Available Help at Discharge: Family;Available PRN/intermittently   Home Access: Stairs to enter   Entrance Stairs-Number of Steps: 3 Home Layout: One level Home Equipment: Walker - 2 wheels;Wheelchair - manual Additional Comments: daughter lives nearby and is at pt's home "most of the time"    Prior Function Level of Independence: Needs assistance   Gait / Transfers Assistance Needed: uses RW in home, WC to go out, at times needs assist for transfers  ADL's / Homemaking Assistance Needed: daughter assists with sponge bath        Hand Dominance        Extremity/Trunk Assessment  Lower Extremity Assessment: LLE deficits/detail;RLE deficits/detail;Generalized weakness RLE Deficits / Details: decr light touch B feet, intact at knees; knee ext +3/5 B    Cervical / Trunk Assessment: Kyphotic  Communication   Communication: HOH  Cognition Arousal/Alertness: Awake/alert Behavior During Therapy: WFL for tasks assessed/performed Overall  Cognitive Status: Within Functional Limits for tasks assessed                      General Comments      Exercises        Assessment/Plan    PT Assessment Patient needs continued PT services  PT Diagnosis Difficulty walking;Generalized weakness   PT Problem List Decreased strength;Decreased activity tolerance;Decreased balance;Impaired sensation;Pain;Decreased mobility  PT Treatment Interventions Gait training;DME instruction;Functional mobility training;Balance training;Therapeutic activities;Patient/family education;Therapeutic exercise   PT Goals (Current goals can be found in the Care Plan section) Acute Rehab PT Goals Patient Stated Goal: to go home PT Goal Formulation: With patient Time For Goal Achievement: 02/13/16 Potential to Achieve Goals: Good    Frequency Min 3X/week   Barriers to discharge        Co-evaluation               End of Session Equipment Utilized During Treatment: Gait belt Activity Tolerance: Patient limited by fatigue Patient left: in chair;with call bell/phone within reach;with chair alarm set Nurse Communication: Mobility status         Time: 9798-9211 PT Time Calculation (min) (ACUTE ONLY): 29 min   Charges:   PT Evaluation $PT Eval Moderate Complexity: 1 Procedure PT Treatments $Therapeutic Activity: 8-22 mins   PT G Codes:        Tamala Ser 01/30/2016, 1:08 PM [phone]

## 2016-01-30 NOTE — Progress Notes (Signed)
ANTICOAGULATION CONSULT NOTE - Follow Up Consult  Pharmacy Consult for Coumadin Indication: atrial fibrillation  Patient Measurements: Height: 5\' 2"  (157.5 cm) Weight: 123 lb 9.6 oz (56.1 kg) IBW/kg (Calculated) : 50.1  Vital Signs: Temp: 97.6 F (36.4 C) (07/31 0437) Temp Source: Oral (07/31 0437) BP: 114/53 (07/31 0437) Pulse Rate: 94 (07/31 0437)  Labs:  Recent Labs  01/28/16 0304 01/29/16 0451 01/30/16 0556  LABPROT 29.1* 27.0* 22.5*  INR 2.68 2.45 1.95  CREATININE 0.85 0.88  --     Estimated Creatinine Clearance: 34.3 mL/min (by C-G formula based on SCr of 0.88 mg/dL).   Assessment:  Continues on Coumadin as prior to admission for afib.  INR slightly subtherapeutic (1.95) on home dose of 2 mg daily.   No signs of bleeding were noted.  Goal of Therapy:  INR 2-3 Monitor platelets by anticoagulation protocol: Yes   Plan:   One time booster dose of Coumadin 3 mg, then continue home Coumadin 2mg  dose   Daily PT/INR for now.  Monitor s/sx of bleeding.  Education completed on 7/29   Ruben Im, PharmD Pager: [phone] 01/30/2016 12:16 PM

## 2016-01-31 LAB — PROTIME-INR
INR: 1.86
PROTHROMBIN TIME: 21.7 s — AB (ref 11.4–15.2)

## 2016-01-31 MED ORDER — METOPROLOL TARTRATE 25 MG PO TABS
25.0000 mg | ORAL_TABLET | Freq: Two times a day (BID) | ORAL | Status: DC
  Administered 2016-01-31 – 2016-02-03 (×6): 25 mg via ORAL
  Filled 2016-01-31 (×6): qty 1

## 2016-01-31 MED ORDER — WARFARIN SODIUM 2.5 MG PO TABS
2.5000 mg | ORAL_TABLET | Freq: Every day | ORAL | Status: DC
  Administered 2016-01-31: 2.5 mg via ORAL
  Filled 2016-01-31: qty 1

## 2016-01-31 MED ORDER — FUROSEMIDE 80 MG PO TABS
80.0000 mg | ORAL_TABLET | Freq: Two times a day (BID) | ORAL | Status: DC
  Administered 2016-01-31 – 2016-02-02 (×3): 80 mg via ORAL
  Filled 2016-01-31 (×4): qty 1

## 2016-01-31 NOTE — Progress Notes (Signed)
Patient Name: Diamond Thomas Date of Encounter: 01/31/2016  Principal Problem:   Anasarca Active Problems:   Essential hypertension   High risk NST 08/2009, pt refused diagnostic heart cath, EF >55% by echo 08/2009   Chronic atrial fibrillation (HCC)   Protein malnutrition (HCC) - at this point I'm not exactly sure what her malnutrition status is.    Primary Cardiologist: Dr. Herbie Baltimore Patient Profile: Diamond Thomas is a 82 year old female with chronic Afib, chronic diastolic CHF, HTN, HLD and CAD. She had a right risk stress test in 2011, refused intervention. Admitted on 01/26/16 with acute on chronic CHF. Presented to the office with pitting edema and admitted for IV diuresis.   SUBJECTIVE: Breathing okay. Would like to go home. Hopes to be able to go home tomorrow.   OBJECTIVE Vitals:   01/30/16 1330 01/30/16 2154 01/31/16 0421 01/31/16 0824  BP: (!) 96/46 (!) 101/47 96/83 (!) 122/58  Pulse: 87 (!) 101 87   Resp: (!) 22 (!) 28 (!) 23   Temp: 97.5 F (36.4 C) 98 F (36.7 C) 97.7 F (36.5 C)   TempSrc: Oral Oral Oral   SpO2: 96% 94% 94%   Weight:   119 lb 14.4 oz (54.4 kg)   Height:        Intake/Output Summary (Last 24 hours) at 01/31/16 1243 Last data filed at 01/31/16 1100  Gross per 24 hour  Intake             1200 ml  Output             2550 ml  Net            -1350 ml   Filed Weights   01/29/16 0500 01/30/16 0437 01/31/16 0421  Weight: 135 lb 9.6 oz (61.5 kg) 123 lb 9.6 oz (56.1 kg) 119 lb 14.4 oz (54.4 kg)    PHYSICAL EXAM General: Well developed, well nourished,elderly female in no acute distress. Head: Normocephalic, atraumatic.  Neck: Supple without bruits, jaw JVD. Lungs:  Resp regular and unlabored, CTA. Heart: RRR, S1, S2, no S3, S4, or murmur; no rub. Abdomen: Soft, non-tender, non-distended, BS + x 4.  Extremities: No clubbing, cyanosis, +2 BLE edema.  Neuro: Alert and oriented X 3. Moves all extremities spontaneously. Psych: Normal  affect.  LABS: INR:  Recent Labs  01/31/16 0417  INR 1.86   Basic Metabolic Panel:  Recent Labs  16/10/96 0451  NA 136  K 3.9  CL 98*  CO2 31  GLUCOSE 105*  BUN 31*  CREATININE 0.88  CALCIUM 9.1   BNP:  B Natriuretic Peptide  Date/Time Value Ref Range Status  01/26/2016 03:32 PM 636.0 (H) 0.0 - 100.0 pg/mL Final    Current Facility-Administered Medications:  .  0.9 %  sodium chloride infusion, 250 mL, Intravenous, PRN, Marykay Lex, MD .  acetaminophen (TYLENOL) tablet 650 mg, 650 mg, Oral, Q4H PRN, Marykay Lex, MD, 650 mg at 01/30/16 1015 .  feeding supplement (ENSURE ENLIVE) (ENSURE ENLIVE) liquid 237 mL, 237 mL, Oral, TID BM, Marykay Lex, MD, 237 mL at 01/31/16 1400 .  furosemide (LASIX) injection 80 mg, 80 mg, Intravenous, BID, Marykay Lex, MD, 80 mg at 01/31/16 0454 .  loratadine (CLARITIN) tablet 10 mg, 10 mg, Oral, Daily, Lennart Pall [place], [place], 10 mg at 01/31/16 0981 .  metoprolol tartrate (LOPRESSOR) tablet 12.5 mg, 12.5 mg, Oral, Q12H, Marykay Lex, MD, 12.5 mg at 01/31/16 1914 .  nitroGLYCERIN (NITROSTAT) SL tablet 0.4 mg, 0.4 mg, Sublingual, Q5 min PRN, Ellsworth Lennox, PA .  ondansetron [facility]) injection 4 mg, 4 mg, Intravenous, Q6H PRN, Marykay Lex, MD .  potassium chloride SA (K-DUR,KLOR-CON) CR tablet 20 mEq, 20 mEq, Oral, BID, Ryan M Dunn, PA-C, 20 mEq at 01/31/16 0865 .  sodium chloride flush (NS) 0.9 % injection 3 mL, 3 mL, Intravenous, Q12H, Marykay Lex, MD, 3 mL at 01/31/16 1000 .  sodium chloride flush (NS) 0.9 % injection 3 mL, 3 mL, Intravenous, PRN, Marykay Lex, MD .  warfarin (COUMADIN) tablet 2 mg, 2 mg, Oral, q1800, Toniann Fail Rudisill, RPH .  Warfarin - Pharmacist Dosing Inpatient, , Does not apply, q1800, Marquita Palms, RPH   TELE: Afib, rate controlled in 90's.          Echo: 01/27/16 Study Conclusions  - Left ventricle: The cavity size was normal. Wall thickness was   normal. Systolic function was  normal. The estimated ejection   fraction was in the range of 60% to 65%. Wall motion was normal;   there were no regional wall motion abnormalities. The study is   not technically sufficient to allow evaluation of LV diastolic   function. - Ventricular septum: The contour showed diastolic flattening and   systolic flattening. - Aortic valve: Mildly calcified annulus. Trileaflet; mildly   calcified leaflets. - Mitral valve: Calcified annulus. There was mild regurgitation. - Left atrium: The atrium was moderately dilated. - Right ventricle: The cavity size was moderately to severely   dilated. Systolic function was mildly to moderately reduced. - Right atrium: The atrium was severely dilated. Central venous   pressure (est): 15 mm Hg. - Atrial septum: No defect or patent foramen ovale was identified. - Tricuspid valve: There was moderate regurgitation. - Pulmonary arteries: Systolic pressure was severely increased. PA   peak pressure: 70 mm Hg (S). - Pericardium, extracardiac: A trivial pericardial effusion was   identified.  Impressions:  - Normal LV wall thickness with LVEF 60-65%. Indeterminate   diastolic function in the setting of atrial fibrillation. LV   septal flattening noted consistent with RV pressure and volume   overload. Moderate left atrial enlargement. MAC with mild mitral   regurgitation. Mildly sclerotic aortic valve. Moderate to severe   RV enlargement with moderately decreased contraction. Severe   right atrial enlargement with elevated estimated CVP. At least   moderate tricuspid regurgitation noted with evidence of severe   pulmonary hypertension and PASP estimated 70 mmHg. Trivial   pericardial effusion.   Current Medications:  . feeding supplement (ENSURE ENLIVE)  237 mL Oral TID BM  . furosemide  80 mg Intravenous BID  . loratadine  10 mg Oral Daily  . metoprolol tartrate  12.5 mg Oral Q12H  . potassium chloride  20 mEq Oral BID  . sodium chloride  flush  3 mL Intravenous Q12H  . warfarin  2 mg Oral q1800  . Warfarin - Pharmacist Dosing Inpatient   Does not apply q1800      ASSESSMENT AND PLAN: Principal Problem:   Anasarca Active Problems:   Essential hypertension   High risk NST 08/2009, pt refused diagnostic heart cath, EF >55% by echo 08/2009   Chronic atrial fibrillation (HCC)   Protein malnutrition (HCC) - at this point I'm not exactly sure what her malnutrition status is.   1. Acute on chronic diastolic CHF: has been on IV diuresis with  Lasix BID. Negative 10.7 liters for  admission and weight down 4 lbs (123--> 119). Echo this admission shows normal LVEF of 60-65%, LV septal flattening consistent with elevated RV pressure and volume overload. Right atrium severely dilated. PA peak pressure is . Suspect that the etiology of her pulmonary hypertension is due to diastolic dysfunction. She has no smoking history and pulmonary embolism is unlikely given that she has been chronically anticoagulated with warfarin.  She is not interested in procedures such as RHC.  BNP elevated to 636 on admission. CXR showing interval development of moderate right pleural effusion with probable underlying atelectasis or infiltrate.Still volume overloaded on exam. IV infiltrated. Will stop IV lasix and transition to lasix 80mg  BID PO. Creat stable. If she remains stable, likely discharge home tomorrow.   2. Chronic atrial fibrillation: Rates are well-controlled. Continue warfarin and metoprolol. INR 1.86 today.  This patients CHA2DS2-VASc Score and unadjusted Ischemic Stroke Rate (% per year) is equal to 11.2 % stroke rate/year from a score of 7(CHF, HTN, DM, Vascular, Female, Age (2)). Continue Coumadin per pharmacy consult.   3. HTN: Well controlled on beta blocker, continue same.   4. High risk NST 08/2009, pt refused diagnostic heart cath, EF >55% by echo 08/2009.  LVEF remains >55%: She likely has known CAD but has refused a cardiac  catheterization. This was again discussed with the patient and her daughter by Dr. Herbie Baltimore in the office and she doesnot want invasive evaluations with heart catheterization etc.   5.  Protein malnutrition: Evaluated by nutrition and recommended Ensure Enlive tid. Concern heranasarca may be partly related to hypoalbuminemia.  Dispo: if she does well on lasix 80mg  BID possible discharge home tomorrow.   Byrd Hesselbach , [place] 12:43 PM 01/31/2016 Pager [phone]  Patient seen and examined. Agree with assessment and plan. Excellent diuresis with net -10,865 u/o since admission. Will change to oral lasix. Resting pulse in the 90's. Will increase metoprolol to 25 mg bid. No wheezing. DC foley today. Possible dc tomorrow if stable.   Lennette Bihari, MD, [facility] 01/31/2016 1:38 PM

## 2016-01-31 NOTE — NC FL2 (Signed)
[place] MEDICAID FL2 LEVEL OF CARE SCREENING TOOL     IDENTIFICATION  Patient Name: TILIA BERNETT Birthdate: 06-28-1925 Sex: female Admission Date (Current Location): 01/26/2016  [facility] and [place] Number:  Producer, television/film/video and Address:  [facility], [address] [place], [place] [address]      Provider Number: [id number]  Attending Physician Name and Address:  Marykay Lex, MD  Relative Name and Phone Number:       Current Level of Care: Hospital Recommended Level of Care: Skilled Nursing Facility Prior Approval Number:    Date Approved/Denied:   PASRR Number: [id number]  Discharge Plan: SNF    Current Diagnoses: Patient Active Problem List   Diagnosis Date Noted  . Anasarca 01/26/2016  . Protein malnutrition (HCC) - at this point I'm not exactly sure what her malnutrition status is.  01/26/2016  . Bilateral lower extremity edema 07/21/2015  . Chronic atrial fibrillation (HCC)   . Hyperlipemia   . Chronic anticoagulation - with warfarin 08/27/2012  . Essential hypertension 08/26/2012  . High risk NST 08/2009, pt refused diagnostic heart cath, EF >55% by echo 08/2009 08/26/2012  . Acid reflux disease 08/26/2012  . Chest pain at rest 08/26/2012    Orientation RESPIRATION BLADDER Height & Weight     Self, Time, Situation, Place  Normal Continent Weight: 54.4 kg (119 lb 14.4 oz) Height:  5\' 2"  (157.5 cm)  BEHAVIORAL SYMPTOMS/MOOD NEUROLOGICAL BOWEL NUTRITION STATUS   (NONE)  (NONE) Continent Diet (2 gram sodium)  AMBULATORY STATUS COMMUNICATION OF NEEDS Skin   Extensive Assist Verbally Normal                       Personal Care Assistance Level of Assistance  Bathing, Feeding, Dressing Bathing Assistance: Limited assistance Feeding assistance: Independent Dressing Assistance: Limited assistance     Functional Limitations Info  Sight, Hearing, Speech Sight Info: Adequate Hearing Info: Adequate Speech Info: Adequate     SPECIAL CARE FACTORS FREQUENCY  PT (By licensed PT), OT (By licensed OT)     PT Frequency: 5/week OT Frequency: 5/week            Contractures Contractures Info: Not present    Additional Factors Info  Code Status, Allergies Code Status Info: Full Code Allergies Info: Pravachol Pravastatin Sodium, Latex           Current Medications (01/31/2016):  This is the current hospital active medication list Current Facility-Administered Medications  Medication Dose Route Frequency Provider Last Rate Last Dose  . 0.9 %  sodium chloride infusion  250 mL Intravenous PRN Marykay Lex, MD      . acetaminophen (TYLENOL) tablet 650 mg  650 mg Oral Q4H PRN Marykay Lex, MD   650 mg at 01/30/16 1015  . feeding supplement (ENSURE ENLIVE) (ENSURE ENLIVE) liquid 237 mL  237 mL Oral TID BM Marykay Lex, MD   237 mL at 01/31/16 1000  . furosemide (LASIX) injection 80 mg  80 mg Intravenous BID Marykay Lex, MD   80 mg at 01/31/16 5621  . loratadine (CLARITIN) tablet 10 mg  10 mg Oral Daily Ellsworth Lennox, [place]   10 mg at 01/31/16 3086  . metoprolol tartrate (LOPRESSOR) tablet 12.5 mg  12.5 mg Oral Q12H Marykay Lex, MD   12.5 mg at 01/31/16 5784  . nitroGLYCERIN (NITROSTAT) SL tablet 0.4 mg  0.4 mg Sublingual Q5 min PRN Ellsworth Lennox,  PA      . ondansetron (ZOFRAN) injection 4 mg  4 mg Intravenous Q6H PRN Marykay Lex, MD      . potassium chloride SA (K-DUR,KLOR-CON) CR tablet 20 mEq  20 mEq Oral BID Raymon Mutton Dunn, PA-C   20 mEq at 01/31/16 1610  . sodium chloride flush (NS) 0.9 % injection 3 mL  3 mL Intravenous Q12H Marykay Lex, MD   3 mL at 01/31/16 1000  . sodium chloride flush (NS) 0.9 % injection 3 mL  3 mL Intravenous PRN Marykay Lex, MD      . warfarin (COUMADIN) tablet 2 mg  2 mg Oral q1800 Toniann Fail Rudisill, RPH      . Warfarin - Pharmacist Dosing Inpatient   Does not apply q1800 Marquita Palms, [facility]         Discharge Medications: Please see discharge summary  for a list of discharge medications.  Relevant Imaging Results:  Relevant Lab Results:   Additional Information SSN: [id number]  Venita Lick, LCSW

## 2016-01-31 NOTE — Plan of Care (Signed)
Problem: Activity: Goal: Capacity to carry out activities will improve Outcome: Progressing Pt stood and pivoted from bed/chair/BSC multiple times today. Tolerated fair, 2 assist, unsteady. Transitioned to PO lasix 80mg  BID, foley removed. No complications. Family updated, BNP in am, if tolerates well overnight will DC in am. Will continue to monitor.

## 2016-01-31 NOTE — Clinical Social Work Placement (Signed)
   CLINICAL SOCIAL WORK PLACEMENT  NOTE  Date:  01/31/2016  Patient Details  Name: Diamond Thomas MRN: [id number] Date of Birth: [birth year]  Clinical Social Work is seeking post-discharge placement for this patient at the Skilled  Nursing Facility level of care (*CSW will initial, date and re-position this form in  chart as items are completed):  Yes   Patient/family provided with [facility] Clinical Social Work Department's list of facilities offering this level of care within the geographic area requested by the patient (or if unable, by the patient's family).  Yes   Patient/family informed of their freedom to choose among providers that offer the needed level of care, that participate in Medicare, Medicaid or managed care program needed by the patient, have an available bed and are willing to accept the patient.  Yes   Patient/family informed of [facility]'s ownership interest in [facility] and [facility], as well as of the fact that they are under no obligation to receive care at these facilities.  PASRR submitted to EDS on 01/31/16     PASRR number received on 01/31/16     Existing PASRR number confirmed on       FL2 transmitted to all facilities in geographic area requested by pt/family on 01/31/16     FL2 transmitted to all facilities within larger geographic area on       Patient informed that his/her managed care company has contracts with or will negotiate with certain facilities, including the following:            Patient/family informed of bed offers received.  Patient chooses bed at       Physician recommends and patient chooses bed at      Patient to be transferred to   on  .  Patient to be transferred to facility by       Patient family notified on   of transfer.  Name of family member notified:        PHYSICIAN Please prepare priority discharge summary, including medications, Please prepare prescriptions, Please sign FL2     Additional Comment:     _______________________________________________ Venita Lick, LCSW 01/31/2016, 11:12 AM

## 2016-01-31 NOTE — Progress Notes (Signed)
ANTICOAGULATION CONSULT NOTE - Follow Up Consult  Pharmacy Consult for Coumadin Indication: atrial fibrillation  Allergies  Allergen Reactions  . Pravachol [Pravastatin Sodium] Other (See Comments)    Myalgias  . Latex Itching    Patient Measurements: Height: 5\' 2"  (157.5 cm) Weight: 119 lb 14.4 oz (54.4 kg) IBW/kg (Calculated) : 50.1  Vital Signs: Temp: 97.8 F (36.6 C) (08/01 1420) Temp Source: Oral (08/01 1420) BP: 127/53 (08/01 1420) Pulse Rate: 94 (08/01 1420)  Labs:  Recent Labs  01/29/16 0451 01/30/16 0556 01/31/16 0417  LABPROT 27.0* 22.5* 21.7*  INR 2.45 1.95 1.86  CREATININE 0.88  --   --     Estimated Creatinine Clearance: 34.3 mL/min (by C-G formula based on SCr of 0.88 mg/dL).  Assessment: Continue on Coumadin as prior to admission for afib.  INR trended down slightly 2.45>1.95>1.86 despite the dose increase yesterday   Daughter at bedside states home dose was 2.5mg  daily until a couple weeks prior to admission   No signs of bleeding noted  Goal of Therapy:  INR 2-3 Monitor platelets by anticoagulation protocol: Yes   Plan:   Dose adjusted to Coumadin 2.5mg  daily   Daily PT/INR for now. Monitor s/sx of bleeding.  Ruben Im, PharmD Pager: [phone] 01/31/2016,3:54 PM

## 2016-02-01 LAB — PROTIME-INR
INR: 1.72
Prothrombin Time: 20.4 seconds — ABNORMAL HIGH (ref 11.4–15.2)

## 2016-02-01 LAB — BASIC METABOLIC PANEL
ANION GAP: 10 (ref 5–15)
BUN: 45 mg/dL — ABNORMAL HIGH (ref 6–20)
CALCIUM: 9.1 mg/dL (ref 8.9–10.3)
CO2: 30 mmol/L (ref 22–32)
Chloride: 95 mmol/L — ABNORMAL LOW (ref 101–111)
Creatinine, Ser: 0.79 mg/dL (ref 0.44–1.00)
Glucose, Bld: 98 mg/dL (ref 65–99)
Potassium: 3.8 mmol/L (ref 3.5–5.1)
SODIUM: 135 mmol/L (ref 135–145)

## 2016-02-01 LAB — BRAIN NATRIURETIC PEPTIDE: B NATRIURETIC PEPTIDE 5: 790.6 pg/mL — AB (ref 0.0–100.0)

## 2016-02-01 MED ORDER — WARFARIN SODIUM 5 MG PO TABS
5.0000 mg | ORAL_TABLET | Freq: Once | ORAL | Status: AC
  Filled 2016-02-01: qty 1

## 2016-02-01 MED ORDER — LOSARTAN POTASSIUM 25 MG PO TABS
25.0000 mg | ORAL_TABLET | Freq: Every day | ORAL | Status: DC
  Administered 2016-02-01 – 2016-02-03 (×3): 25 mg via ORAL
  Filled 2016-02-01 (×2): qty 1

## 2016-02-01 MED ORDER — LISINOPRIL 2.5 MG PO TABS
2.5000 mg | ORAL_TABLET | Freq: Every day | ORAL | Status: DC
  Filled 2016-02-01: qty 1

## 2016-02-01 NOTE — Care Management Important Message (Signed)
Important Message  Patient Details  Name: Diamond Thomas MRN: [id number] Date of Birth: 1924-12-13   Medicare Important Message Given:  Yes    Diamond Thomas [place] 02/01/2016, 10:53 AM

## 2016-02-01 NOTE — Progress Notes (Signed)
Physical Therapy Treatment Patient Details Name: Diamond Thomas MRN: [id number] DOB: Sep 26, 1924 Today's Date: 02/01/2016    History of Present Illness 82 y.o. female with h/o HTN, a-fib, CHF admitted with anasarca, acute on chronic diastolic CHF, and protein malnutrition.      PT Comments    Pt is progressing well with her mobility and was able to walk down the hall with two person assist for safety.  She continues to need constant hands on assist to prevent LOB when she is on her feet even with RW use.  She would be appropriate for SNF level rehab at discharge.   Follow Up Recommendations  SNF;Supervision/Assistance - 24 hour     Equipment Recommendations  None recommended by PT    Recommendations for Other Services   NA     Precautions / Restrictions Precautions Precautions: Fall Precaution Comments: Pt is unsteady on her feet    Mobility  Bed Mobility               General bed mobility comments: Pt OOB in chair  Transfers Overall transfer level: Needs assistance Equipment used: Rolling walker (2 wheeled) Transfers: Sit to/from Stand Sit to Stand: +2 safety/equipment;Mod assist         General transfer comment: Two person mod assist to stand from lower recliner chair.  help needed to stabilize chair and RW.    Ambulation/Gait Ambulation/Gait assistance: +2 safety/equipment;Min assist Ambulation Distance (Feet): 60 Feet Assistive device: Rolling walker (2 wheeled) Gait Pattern/deviations: Step-through pattern;Shuffle;Trunk flexed Gait velocity: decreased   General Gait Details: Verbal cues for upright posture, closer proximity to RW.  Pt with poor endurance and activity tolerance, reporting LE pain during gait.  Chair to follow to encourage increased gait distance and safety.        Balance Overall balance assessment: Needs assistance Sitting-balance support: Feet supported;Bilateral upper extremity supported Sitting balance-Leahy Scale: Poor    Postural control: Posterior lean Standing balance support: Bilateral upper extremity supported Standing balance-Leahy Scale: Poor                      Cognition Arousal/Alertness: Awake/alert Behavior During Therapy: WFL for tasks assessed/performed Overall Cognitive Status: Impaired/Different from baseline Area of Impairment: Orientation Orientation Level: Time                         Pertinent Vitals/Pain Pain Assessment: Faces Pain Score: 4  Pain Location: bil legs Pain Descriptors / Indicators: Aching;Burning;Grimacing;Guarding Pain Intervention(s): Limited activity within patient's tolerance;Monitored during session;Repositioned           PT Goals (current goals can now be found in the care plan section) Acute Rehab PT Goals Patient Stated Goal: to go home Progress towards PT goals: Progressing toward goals    Frequency  Min 3X/week    PT Plan Current plan remains appropriate       End of Session Equipment Utilized During Treatment: Gait belt Activity Tolerance: Patient limited by fatigue;Patient limited by pain Patient left: in chair;with call bell/phone within reach;with chair alarm set     Time: 5929-2446 PT Time Calculation (min) (ACUTE ONLY): 12 min  Charges:  $Gait Training: 8-22 mins                     Shalondra Wunschel B. Verlean Allport, PT, DPT [phone]   02/01/2016, 5:06 PM

## 2016-02-01 NOTE — Progress Notes (Signed)
Patient Name: Diamond Thomas Date of Encounter: 02/01/2016  Principal Problem:   Anasarca Active Problems:   Essential hypertension   High risk NST 08/2009, pt refused diagnostic heart cath, EF >55% by echo 08/2009   Chronic atrial fibrillation (HCC)   Protein malnutrition (HCC) - at this point I'm not exactly sure what her malnutrition status is.    Primary Cardiologist: Dr, Herbie Baltimore Patient Profile: Ms. Diamond Thomas is a 82 year old female with chronic Afib, chronic diastolic CHF, HTN, HLD and CAD. She had a right risk stress test in 2011, refused intervention. Admitted on 01/26/16 with acute on chronic CHF. Presented to the office with pitting edema and admitted for IV diuresis.   SUBJECTIVE: Feels well, says she is breathing well. Sitting up in the chair today.   OBJECTIVE Vitals:   01/31/16 1420 01/31/16 2152 02/01/16 0501 02/01/16 0534  BP: (!) 127/53 (!) 112/54  111/86  Pulse: 94 (!) 102  84  Resp: 16 (!) 29  (!) 27  Temp: 97.8 F (36.6 C) 97.9 F (36.6 C)  98.1 F (36.7 C)  TempSrc: Oral Oral  Oral  SpO2:  94%  99%  Weight:   113 lb 8 oz (51.5 kg)   Height:        Intake/Output Summary (Last 24 hours) at 02/01/16 0756 Last data filed at 02/01/16 0641  Gross per 24 hour  Intake              960 ml  Output             1850 ml  Net             -890 ml   Filed Weights   01/30/16 0437 01/31/16 0421 02/01/16 0501  Weight: 123 lb 9.6 oz (56.1 kg) 119 lb 14.4 oz (54.4 kg) 113 lb 8 oz (51.5 kg)    PHYSICAL EXAM General: Well developed, well nourished, female in no acute distress. Head: Normocephalic, atraumatic.  Neck: Supple without bruits, no JVD. Lungs:  Resp regular and unlabored, CTA. Heart: RRR, S1, S2, no S3, S4, or murmur; no rub. Abdomen: Soft, non-tender, non-distended, BS + x 4.  Extremities: No clubbing, cyanosis, + 1 BLE edema.  Neuro: Alert and oriented X 3. Moves all extremities spontaneously. Psych: Normal affect.  LABS: INR: Recent Labs   02/01/16 0400  INR 1.72   Basic Metabolic Panel: Recent Labs  02/01/16 0400  NA 135  K 3.8  CL 95*  CO2 30  GLUCOSE 98  BUN 45*  CREATININE 0.79  CALCIUM 9.1   BNP:  B Natriuretic Peptide  Date/Time Value Ref Range Status  02/01/2016 04:01 AM 790.6 (H) 0.0 - 100.0 pg/mL Final  01/26/2016 03:32 PM 636.0 (H) 0.0 - 100.0 pg/mL Final     Current Facility-Administered Medications:  .  0.9 %  sodium chloride infusion, 250 mL, Intravenous, PRN, Marykay Lex, MD .  acetaminophen (TYLENOL) tablet 650 mg, 650 mg, Oral, Q4H PRN, Marykay Lex, MD, 650 mg at 01/30/16 1015 .  feeding supplement (ENSURE ENLIVE) (ENSURE ENLIVE) liquid 237 mL, 237 mL, Oral, TID BM, Marykay Lex, MD, 237 mL at 01/31/16 2202 .  furosemide (LASIX) tablet 80 mg, 80 mg, Oral, BID, Janetta Hora, PA-C, 80 mg at 01/31/16 1330 .  loratadine (CLARITIN) tablet 10 mg, 10 mg, Oral, Daily, Lennart Pall [place], [place], 10 mg at 01/31/16 4782 .  metoprolol tartrate (LOPRESSOR) tablet 25 mg, 25 mg, Oral, Q12H, Maisie Fus  Alphonsus Sias, MD, 25 mg at 01/31/16 2203 .  nitroGLYCERIN (NITROSTAT) SL tablet 0.4 mg, 0.4 mg, Sublingual, Q5 min PRN, Ellsworth Lennox, PA .  ondansetron [facility]) injection 4 mg, 4 mg, Intravenous, Q6H PRN, Marykay Lex, MD .  potassium chloride SA (K-DUR,KLOR-CON) CR tablet 20 mEq, 20 mEq, Oral, BID, Ryan M Dunn, PA-C, 20 mEq at 01/31/16 2203 .  sodium chloride flush (NS) 0.9 % injection 3 mL, 3 mL, Intravenous, Q12H, Marykay Lex, MD, 3 mL at 01/31/16 2207 .  sodium chloride flush (NS) 0.9 % injection 3 mL, 3 mL, Intravenous, PRN, Marykay Lex, MD .  warfarin (COUMADIN) tablet 2.5 mg, 2.5 mg, Oral, q1800, Toniann Fail Rudisill, RPH, 2.5 mg at 01/31/16 1655 .  Warfarin - Pharmacist Dosing Inpatient, , Does not apply, q1800, Marquita Palms, RPH   TELE: Afib, rates in 80-90's.         Echo: 01/27/16 Study Conclusions  - Left ventricle: The cavity size was normal. Wall thickness was normal.  Systolic function was normal. The estimated ejection fraction was in the range of 60% to 65%. Wall motion was normal; there were no regional wall motion abnormalities. The study is not technically sufficient to allow evaluation of LV diastolic function. - Ventricular septum: The contour showed diastolic flattening and systolic flattening. - Aortic valve: Mildly calcified annulus. Trileaflet; mildly calcified leaflets. - Mitral valve: Calcified annulus. There was mild regurgitation. - Left atrium: The atrium was moderately dilated. - Right ventricle: The cavity size was moderately to severely dilated. Systolic function was mildly to moderately reduced. - Right atrium: The atrium was severely dilated. Central venous pressure (est): 15 mm Hg. - Atrial septum: No defect or patent foramen ovale was identified. - Tricuspid valve: There was moderate regurgitation. - Pulmonary arteries: Systolic pressure was severely increased. PA peak pressure: 70 mm Hg (S). - Pericardium, extracardiac: A trivial pericardial effusion was identified.  Impressions:  - Normal LV wall thickness with LVEF 60-65%. Indeterminate diastolic function in the setting of atrial fibrillation. LV septal flattening noted consistent with RV pressure and volume overload. Moderate left atrial enlargement. MAC with mild mitral regurgitation. Mildly sclerotic aortic valve. Moderate to severe RV enlargement with moderately decreased contraction. Severe right atrial enlargement with elevated estimated CVP. At least moderate tricuspid regurgitation noted with evidence of severe pulmonary hypertension and PASP estimated 70 mmHg. Trivial pericardial effusion.  Current Medications:  . feeding supplement (ENSURE ENLIVE)  237 mL Oral TID BM  . furosemide  80 mg Oral BID  . loratadine  10 mg Oral Daily  . metoprolol tartrate  25 mg Oral Q12H  . potassium chloride  20 mEq Oral BID  . sodium  chloride flush  3 mL Intravenous Q12H  . warfarin  2.5 mg Oral q1800  . Warfarin - Pharmacist Dosing Inpatient   Does not apply q1800      ASSESSMENT AND PLAN: Principal Problem:   Anasarca Active Problems:   Essential hypertension   High risk NST 08/2009, pt refused diagnostic heart cath, EF >55% by echo 08/2009   Chronic atrial fibrillation (HCC)   Protein malnutrition (HCC) - at this point I'm not exactly sure what her malnutrition status is.   1. Acute on chronic diastolic CHF:  Negative 11L liters for admission and weight down 10 lbs (123--> 113). Echo this admission shows normal LVEF of 60-65%, LV septal flattening consistent with elevated RV pressure and volume overload. Right atrium severely dilated. PA peak pressure is .  Suspect that the etiology of her pulmonary hypertension is due to diastolic dysfunction. She has no smoking history and pulmonary embolism is unlikely given that she has been chronically anticoagulatedwith warfarin. She is not interested in procedures such as RHC.  BNP elevated to 636 on admission. CXR showing interval development of moderate right pleural effusion with probable underlying atelectasis or infiltrate.Still volume overloaded on exam. IV infiltrated. On po Lasix now, doing well. Would add ACE-I today.   2. Chronic atrial fibrillation: Rates are well-controlled. Continue warfarin and metoprolol. INR 1.86 today. Metoprolol was increased yesterday to 25mg  BID.   This patients CHA2DS2-VASc Score and unadjusted Ischemic Stroke Rate (% per year) is equal to 11.2 % stroke rate/year from a score of 7(CHF, HTN, DM, Vascular, Female, Age (2)). Continue Coumadin per pharmacy consult.   3. HTN: Well controlled on beta blocker, continue same.   4. High risk NST 08/2009, pt refused diagnostic heart cath, EF >55% by echo 08/2009. LVEF remains >55%: She likely has known CAD but has refused a cardiac catheterization. This was again discussed with the patient  and her daughter by Dr. Herbie Baltimore in the office and she doesnot want invasive evaluations with heart catheterization etc.   5. Protein malnutrition: Evaluated by nutrition and recommended Ensure Enlive tid. Concern heranasarca may be partly related to hypoalbuminemia.     Signed, Little Ishikawa , NP 7:56 AM 02/01/2016 Pager [phone]   Patient seen and examined. Agree with assessment and plan. Pt is feeling better but still mildly dyspneic. BNP remains elevated at 790. Will start losartan 25 mg today. Keep today; f/u renal panel in am and probable dc tomorrow.   Lennette Bihari, MD, [facility] 02/01/2016 9:45 AM

## 2016-02-01 NOTE — Progress Notes (Signed)
ANTICOAGULATION CONSULT NOTE - Follow Up Consult  Pharmacy Consult for Coumadin Indication: atrial fibrillation  Allergies  Allergen Reactions  . Pravachol [Pravastatin Sodium] Other (See Comments)    Myalgias  . Latex Itching    Patient Measurements: Height: 5\' 2"  (157.5 cm) Weight: 113 lb 8 oz (51.5 kg) IBW/kg (Calculated) : 50.1  Vital Signs: Temp: 98.1 F (36.7 C) (08/02 0534) Temp Source: Oral (08/02 0534) BP: 111/86 (08/02 0534) Pulse Rate: 84 (08/02 0534)  Labs:  Recent Labs  01/30/16 0556 01/31/16 0417 02/01/16 0400  LABPROT 22.5* 21.7* 20.4*  INR 1.95 1.86 1.72  CREATININE  --   --  0.79    Estimated Creatinine Clearance: 37.7 mL/min (by C-G formula based on SCr of 0.8 mg/dL).  Assessment: Coumadin PTA for afib continuing inpatient. CHA2DS2-VASc=7. INR continues to trend down on patient's PTA dose - remains subtherapeutic today at 1.72. Last CBC 7/27 Hgb 10.5, plt 248. No overt bleeding reported.   PTA warfarin dose: 2mg  daily  Goal of Therapy:  INR 2-3 Monitor platelets by anticoagulation protocol: Yes   Plan:  -Coumadin 5 mg x1 tonight   -Daily PT/INR  -Monitor CBC, s/sx bleeding  Sherle Poe, PharmD Clinical Pharmacist 10:37 AM, 02/01/2016

## 2016-02-01 NOTE — Clinical Social Work Note (Signed)
Clinical Social Work Assessment  Patient Details  Name: Diamond Thomas MRN: [id number] Date of Birth: 1924-08-05  Date of referral:  02/01/16               Reason for consult:  Discharge Planning, Facility Placement                Permission sought to share information with:  Facility Sport and exercise psychologist, Family Supports Permission granted to share information::  Yes, Verbal Permission Granted  Name::     Sports coach::  SNFs  Relationship::  Daughter  Contact Information:     Housing/Transportation Living arrangements for the past 2 months:  [place] of Information:  Patient, Adult Children Patient Interpreter Needed:  None Criminal Activity/Legal Involvement Pertinent to Current Situation/Hospitalization:  No - Comment as needed Significant Relationships:  Adult Children Lives with:  Self Do you feel safe going back to the place where you live?  Yes Need for family participation in patient care:  Yes (Comment)  Care giving concerns:  The patient states that she has no concerns about returning home despite PT recommendation for SNF/24hr. Supervision. The patient shares that she will not have 24hr assistance/supervision at home.   Social Worker assessment / plan:  CSW met with patient at bedside to complete assessment. CSW reviewed PT recommendation for SNF/24hr supervision with the patient. The patient states that she does not want to go to a SNF at time of discharge. CSW inquired about the patient's plan. She states that she plans to return home. CSW tested this plan by pointing out that the patient is not currently walking and is requiring quite a bit of assistance. The patient said she would be agreeable to CSW speaking with daughter to discuss this further. CSW contacted daughter Helene Kelp who states that the patient would in fact be at home alone and that rehab may need to be looked into. Helene Kelp plans to discuss it further with the patient. CSW explained the SNF  search/placement process to the patient and the daughter and answered their questions. The patient is agreeable to referrals to SNF to see what would be available, but she has not yet committed to placement.  Employment status:  Retired Nurse, adult PT Recommendations:  Research officer, trade union, [place] / Referral to community resources:  [place]  Patient/Family's Response to care:  The patient and daughter appear happy with the care the patient has received. The patient and daughter express appreciation for CSW assistnace.  Patient/Family's Understanding of and Emotional Response to Diagnosis, Current Treatment, and Prognosis:  The patient and daughter appear to have a good understanding of the reason for the patient's admission. The patient seems to have unrealistic expectations to return home alone at discharge. The patient's daughter seems to see and understand the need for supervision and rehab and seems more agreeable to placement. Overall the patient seems to be coping well with hospitalization.   Emotional Assessment Appearance:  Appears stated age Attitude/Demeanor/Rapport:  Other (The patient is appropriate and welcoming of CSW.) Affect (typically observed):  Appropriate, Calm, Pleasant Orientation:  Oriented to Self, Oriented to Place, Oriented to  Time, Oriented to Situation Alcohol / Substance use:  Not Applicable Psych involvement (Current and /or in the community):  No (Comment)  Discharge Needs  Concerns to be addressed:  Discharge Planning Concerns, Care Coordination Readmission within the last 30 days:  No Current discharge risk:  Lives alone, Physical Impairment Barriers  to Discharge:  Continued Medical Work up   Sherlynn Carbon 02/01/2016, 3:34 AM

## 2016-02-02 LAB — BASIC METABOLIC PANEL
ANION GAP: 8 (ref 5–15)
BUN: 46 mg/dL — ABNORMAL HIGH (ref 6–20)
CALCIUM: 9.2 mg/dL (ref 8.9–10.3)
CHLORIDE: 96 mmol/L — AB (ref 101–111)
CO2: 32 mmol/L (ref 22–32)
Creatinine, Ser: 0.8 mg/dL (ref 0.44–1.00)
GFR calc non Af Amer: >60 mL/min (ref >60)
GLUCOSE: 103 mg/dL — AB (ref 65–99)
Potassium: 3.9 mmol/L (ref 3.5–5.1)
Sodium: 136 mmol/L (ref 135–145)

## 2016-02-02 LAB — CBC
HEMATOCRIT: 31.6 % — AB (ref 36.0–46.0)
HEMOGLOBIN: 10.1 g/dL — AB (ref 12.0–15.0)
MCH: 26 pg (ref 26.0–34.0)
MCHC: 32 g/dL (ref 30.0–36.0)
MCV: 81.4 fL (ref 78.0–100.0)
Platelets: 230 10*3/uL (ref 150–400)
RBC: 3.88 MIL/uL (ref 3.87–5.11)
RDW: 16.6 % — ABNORMAL HIGH (ref 11.5–15.5)
WBC: 6 10*3/uL (ref 4.0–10.5)

## 2016-02-02 LAB — PROTIME-INR
INR: 1.86
PROTHROMBIN TIME: 21.7 s — AB (ref 11.4–15.2)

## 2016-02-02 LAB — GLUCOSE, CAPILLARY: Glucose-Capillary: 91 mg/dL (ref 65–99)

## 2016-02-02 MED ORDER — WARFARIN SODIUM 2 MG PO TABS
3.0000 mg | ORAL_TABLET | Freq: Once | ORAL | Status: AC
  Administered 2016-02-02: 3 mg via ORAL
  Filled 2016-02-02: qty 1

## 2016-02-02 MED ORDER — FUROSEMIDE 40 MG PO TABS
60.0000 mg | ORAL_TABLET | Freq: Two times a day (BID) | ORAL | Status: DC
  Administered 2016-02-02: 60 mg via ORAL
  Filled 2016-02-02: qty 1

## 2016-02-02 NOTE — Progress Notes (Signed)
Patient Name: Diamond Thomas Date of Encounter: 02/02/2016  Principal Problem:   Anasarca Active Problems:   Essential hypertension   High risk NST 08/2009, pt refused diagnostic heart cath, EF >55% by echo 08/2009   Chronic atrial fibrillation (HCC)   Protein malnutrition (HCC) - at this point I'm not exactly sure what her malnutrition status is.    Primary Cardiologist: Dr. Herbie Baltimore Patient Profile: Diamond Thomas is a 82 year old female with chronic Afib, chronic diastolic CHF, HTN, HLD and CAD. She had a right risk stress test in 2011, refused intervention. Admitted on 01/26/16 with acute on chronic CHF. Presented to the office with pitting edema and admitted for IV diuresis.   SUBJECTIVE: Feels well, denies SOB. Says she feels weak.   OBJECTIVE Vitals:   02/01/16 1000 02/01/16 2147 02/02/16 0503 02/02/16 0902  BP: 120/80 (!) 100/49 109/77 107/63  Pulse: 80 92 84 82  Resp:   (!) 24   Temp:  98.2 F (36.8 C) 98.4 F (36.9 C)   TempSrc:  Oral Oral   SpO2:  95% 97%   Weight:   114 lb (51.7 kg)   Height:        Intake/Output Summary (Last 24 hours) at 02/02/16 0907 Last data filed at 02/02/16 0045  Gross per 24 hour  Intake              843 ml  Output              725 ml  Net              118 ml   Filed Weights   01/31/16 0421 02/01/16 0501 02/02/16 0503  Weight: 119 lb 14.4 oz (54.4 kg) 113 lb 8 oz (51.5 kg) 114 lb (51.7 kg)    PHYSICAL EXAM General:Elderly female in no acute distress. Head: Normocephalic, atraumatic.  Neck: Supple without bruits, jaw JVD. Lungs:  Resp regular and unlabored, diminished in bases.  Heart: RRR, S1, S2, no S3, S4, or murmur; no rub. Abdomen: Soft, non-tender, non-distended, BS + x 4.  Extremities: No clubbing, cyanosis, BLE edema, legs wrapped in Una boots.  Neuro: Alert and oriented X 3. Moves all extremities spontaneously. Psych: Normal affect.  LABS: CBC: Recent Labs  02/02/16 0345  WBC 6.0  HGB 10.1*  HCT 31.6*  MCV 81.4    PLT 230   INR: Recent Labs  02/02/16 0345  INR 1.86   Basic Metabolic Panel: Recent Labs  02/01/16 0400 02/02/16 0345  NA 135 136  K 3.8 3.9  CL 95* 96*  CO2 30 32  GLUCOSE 98 103*  BUN 45* 46*  CREATININE 0.79 0.80  CALCIUM 9.1 9.2   BNP:  B Natriuretic Peptide  Date/Time Value Ref Range Status  02/01/2016 04:01 AM 790.6 (H) 0.0 - 100.0 pg/mL Final  01/26/2016 03:32 PM 636.0 (H) 0.0 - 100.0 pg/mL Final     Current Facility-Administered Medications:  .  0.9 %  sodium chloride infusion, 250 mL, Intravenous, PRN, Marykay Lex, MD .  acetaminophen (TYLENOL) tablet 650 mg, 650 mg, Oral, Q4H PRN, Marykay Lex, MD, 650 mg at 01/30/16 1015 .  feeding supplement (ENSURE ENLIVE) (ENSURE ENLIVE) liquid 237 mL, 237 mL, Oral, TID BM, Marykay Lex, MD, 237 mL at 02/02/16 0901 .  furosemide (LASIX) tablet 80 mg, 80 mg, Oral, BID, Janetta Hora, PA-C, 80 mg at 02/02/16 0900 .  loratadine (CLARITIN) tablet 10 mg, 10 mg, Oral,  Daily, Lennart Pall [place], [place], 10 mg at 02/02/16 0901 .  losartan (COZAAR) tablet 25 mg, 25 mg, Oral, Daily, Lennette Bihari, MD, 25 mg at 02/02/16 0902 .  metoprolol tartrate (LOPRESSOR) tablet 25 mg, 25 mg, Oral, Q12H, Lennette Bihari, MD, 25 mg at 02/02/16 0902 .  nitroGLYCERIN (NITROSTAT) SL tablet 0.4 mg, 0.4 mg, Sublingual, Q5 min PRN, Ellsworth Lennox, PA .  ondansetron [facility]) injection 4 mg, 4 mg, Intravenous, Q6H PRN, Marykay Lex, MD .  potassium chloride SA (K-DUR,KLOR-CON) CR tablet 20 mEq, 20 mEq, Oral, BID, Ryan M Dunn, PA-C, 20 mEq at 02/02/16 1914 .  sodium chloride flush (NS) 0.9 % injection 3 mL, 3 mL, Intravenous, Q12H, Marykay Lex, MD, 3 mL at 02/02/16 0906 .  sodium chloride flush (NS) 0.9 % injection 3 mL, 3 mL, Intravenous, PRN, Marykay Lex, MD .  warfarin (COUMADIN) tablet 5 mg, 5 mg, Oral, ONCE-1800, Marykay Lex, MD .  Warfarin - Pharmacist Dosing Inpatient, , Does not apply, q1800, Marquita Palms, [facility]     TELE: Afib, rate controlled 80-90's.        ECG: Afib  Echo: 01/27/16 Study Conclusions  - Left ventricle: The cavity size was normal. Wall thickness was normal. Systolic function was normal. The estimated ejection fraction was in the range of 60% to 65%. Wall motion was normal; there were no regional wall motion abnormalities. The study is not technically sufficient to allow evaluation of LV diastolic function. - Ventricular septum: The contour showed diastolic flattening and systolic flattening. - Aortic valve: Mildly calcified annulus. Trileaflet; mildly calcified leaflets. - Mitral valve: Calcified annulus. There was mild regurgitation. - Left atrium: The atrium was moderately dilated. - Right ventricle: The cavity size was moderately to severely dilated. Systolic function was mildly to moderately reduced. - Right atrium: The atrium was severely dilated. Central venous pressure (est): 15 mm Hg. - Atrial septum: No defect or patent foramen ovale was identified. - Tricuspid valve: There was moderate regurgitation. - Pulmonary arteries: Systolic pressure was severely increased. PA peak pressure: 70 mm Hg (S). - Pericardium, extracardiac: A trivial pericardial effusion was identified.  Impressions:  - Normal LV wall thickness with LVEF 60-65%. Indeterminate diastolic function in the setting of atrial fibrillation. LV septal flattening noted consistent with RV pressure and volume overload. Moderate left atrial enlargement. MAC with mild mitral regurgitation. Mildly sclerotic aortic valve. Moderate to severe RV enlargement with moderately decreased contraction. Severe right atrial enlargement with elevated estimated CVP. At least moderate tricuspid regurgitation noted with evidence of severe pulmonary hypertension and PASP estimated 70 mmHg. Trivial pericardial effusion.  Current Medications:  . feeding supplement (ENSURE ENLIVE)   237 mL Oral TID BM  . furosemide  80 mg Oral BID  . loratadine  10 mg Oral Daily  . losartan  25 mg Oral Daily  . metoprolol tartrate  25 mg Oral Q12H  . potassium chloride  20 mEq Oral BID  . sodium chloride flush  3 mL Intravenous Q12H  . warfarin  5 mg Oral ONCE-1800  . Warfarin - Pharmacist Dosing Inpatient   Does not apply q1800      ASSESSMENT AND PLAN: Principal Problem:   Anasarca Active Problems:   Essential hypertension   High risk NST 08/2009, pt refused diagnostic heart cath, EF >55% by echo 08/2009   Chronic atrial fibrillation (HCC)   Protein malnutrition (HCC) - at this point I'm not exactly sure what her malnutrition status is.  1. Acute on chronic diastolic CHF: Negative 11Lliters for admission and weight down 10 lbs (123-->113). Echo this admission shows normal LVEF of 60-65%, LV septal flattening consistent with elevated RV pressure and volume overload. Right atrium severely dilated. PA peak pressure is . Suspect that the etiology of her pulmonary hypertension is due to diastolic dysfunction. She has no smoking history and pulmonary embolism is unlikely given that she has been chronically anticoagulatedwith warfarin. She is not interested in procedures such as RHC. Losartan added yesterday, BP stable.   BNP elevated to 636 on admission, was 790.6 yesterday. CXR showing interval development of moderate right pleural effusion with probable underlying atelectasis or infiltrate. She has been on 80mg  po Lasix, MD to advise on readiness to dc home today.   2. Chronic atrial fibrillation: Rates are well-controlled. Continue warfarin and metoprolol. INR 1.86 today. Metoprolol was increased yesterday to 25mg  BID.   This patients CHA2DS2-VASc Score and unadjusted Ischemic Stroke Rate (% per year) is equal to 11.2 % stroke rate/year from a score of 7(CHF, HTN, DM, Vascular, Female, Age (2)). Continue Coumadin per pharmacy consult.   3. HTN: Well controlled on beta  blocker and ARB, continue same.   4. High risk NST 08/2009, pt refused diagnostic heart cath, EF >55% by echo 08/2009. LVEF remains >55%: She likely has known CAD but has refused a cardiac catheterization. This was again discussed with the patient and her daughter by Dr. Herbie Baltimore in the office and she doesnot want invasive evaluations with heart catheterization etc.   5. Protein malnutrition: Evaluated by nutrition and recommended Ensure Enlive tid. Concern heranasarca may be partly related to hypoalbuminemia.  Dispo: Lives alone, daughter helps out, but she has needed assistance with transfer to bed and bathroom here. Would get PT to see again to see if she needs SNF vs. Rehab. Patient is agreeable.     Signed, Little Ishikawa , NP 9:07 AM 02/02/2016 Pager [phone]   Patient seen and examined. Agree with assessment and plan. I/O -11017 since admission. Tolerating initiation of losartan with BP 107/63.  Breathing better. JVD mildly increased. No rales. RRR 1/6 sem BS+, legs wrapped.  Will decrease furosemide to 60 mg bid today. Will ultimately titrate losartan as tolerates to 25 bid if BP allows.  Probably needs short ten SNF. Will keep today.    Lennette Bihari, MD, [facility] 02/02/2016 9:48 AM

## 2016-02-02 NOTE — Progress Notes (Signed)
ANTICOAGULATION CONSULT NOTE - Follow Up Consult  Pharmacy Consult for Coumadin Indication: atrial fibrillation  Allergies  Allergen Reactions  . Pravachol [Pravastatin Sodium] Other (See Comments)    Myalgias  . Latex Itching    Patient Measurements: Height: 5\' 2"  (157.5 cm) Weight: 114 lb (51.7 kg) IBW/kg (Calculated) : 50.1  Vital Signs: Temp: 98.8 F (37.1 C) (08/03 1409) Temp Source: Oral (08/03 1409) BP: 78/42 (08/03 1409) Pulse Rate: 80 (08/03 1409)  Labs:  Recent Labs  01/31/16 0417 02/01/16 0400 02/02/16 0345  HGB  --   --  10.1*  HCT  --   --  31.6*  PLT  --   --  230  LABPROT 21.7* 20.4* 21.7*  INR 1.86 1.72 1.86  CREATININE  --  0.79 0.80    Estimated Creatinine Clearance: 37.7 mL/min (by C-G formula based on SCr of 0.8 mg/dL).  Assessment: Coumadin PTA for afib continuing inpatient. CHA2DS2-VASc=7. INR continues to be subtherapeutic.  Last CBC 7/27 Hgb 10.5, plt 248. No overt bleeding reported. Home dose coumadin 2 mg daily.  INR today is 1.86 - yesterday's coumadin 5 mg dose was NOT charted as given.  Pt asleep and RN did not have pt yesterday, so appears pt did not get dose.   PTA warfarin dose: 2mg  daily  Goal of Therapy:  INR 2-3 Monitor platelets by anticoagulation protocol: Yes   Plan:  -Coumadin 3 mg x1 tonight   -Daily PT/INR  -Monitor CBC, s/sx bleeding  Herby Abraham, Pharm.D. [phone] 02/02/2016 2:38 PM

## 2016-02-02 NOTE — Progress Notes (Signed)
Nurse tech notified RN of low BP.  Manual BP checked=78/42 at 1417.  Patient asymptomatic. Suzzette Righter, NP notified.  She stated to recheck BP in 30 minutes to 1 hour.  Rechecked BP at 154=97/50

## 2016-02-02 NOTE — Progress Notes (Signed)
Nutrition Follow-up  DOCUMENTATION CODES:   Severe malnutrition in context of chronic illness  INTERVENTION:    Continue to offer Ensure Enlive TID, each supplement provides 350 kcal and 20 grams of protein  NUTRITION DIAGNOSIS:   Malnutrition related to chronic illness as evidenced by severe depletion of body fat, severe depletion of muscle mass.  Ongoing  GOAL:   Patient will meet greater than or equal to 90% of their needs  Met  MONITOR:   PO intake, Supplement acceptance, Weight trends, I & O's, Labs  ASSESSMENT:   82 yo female w/ PMH of chronic diastolic CHF (EF 38% by echo in 2011), chronic atrial fibrillation, HTN, HLD, and known high-risk NST (has refused cath) who was seen in the office on 7/27 for worsening dyspnea and lower extremity edema. Directly admitted to [facility] for further diuresis.   Patient continues to have a good appetite and is consuming 75-100% of meals. She is drinking 1-2 Ensure Enlive supplements per day. Current intake is meeting nutrition needs.  Diet Order:  Diet 2 gram sodium Room service appropriate? Yes; Fluid consistency: Thin  Skin:  Reviewed, no issues  Last BM:  8/2  Height:   Ht Readings from Last 1 Encounters:  01/26/16 5' 2"  (1.575 m)    Weight:   Wt Readings from Last 1 Encounters:  02/02/16 114 lb (51.7 kg)    Ideal Body Weight:  50 kg  BMI:  Body mass index is 20.85 kg/m.  Estimated Nutritional Needs:   Kcal:  1500-1600  Protein:  75-85 gm  Fluid:  1.5 L  EDUCATION NEEDS:   Education needs addressed  Molli Barrows, [place], [place], [place] Pager [phone] After Hours Pager [phone]

## 2016-02-02 NOTE — Clinical Social Work Note (Signed)
Clinical Social Worker continuing to follow patient and family for support and discharge planning needs.  Per PA, patient will be ready for discharge on 08/04.  CSW notified Sonny Dandy, who is in agreement and patient daughter is aware.  Patient daughter requests ambulance transport at discharge.  CSW remains available for support and to facilitate patient discharge needs.  Macario Golds, [place] [phone]

## 2016-02-03 LAB — PROTIME-INR
INR: 2.21
Prothrombin Time: 24.9 seconds — ABNORMAL HIGH (ref 11.4–15.2)

## 2016-02-03 LAB — BASIC METABOLIC PANEL
ANION GAP: 8 (ref 5–15)
BUN: 70 mg/dL — AB (ref 6–20)
CALCIUM: 9.2 mg/dL (ref 8.9–10.3)
CO2: 31 mmol/L (ref 22–32)
CREATININE: 1.24 mg/dL — AB (ref 0.44–1.00)
Chloride: 97 mmol/L — ABNORMAL LOW (ref 101–111)
GFR, EST AFRICAN AMERICAN: 43 mL/min — AB (ref >60)
GFR, EST NON AFRICAN AMERICAN: 37 mL/min — AB (ref >60)
GLUCOSE: 100 mg/dL — AB (ref 65–99)
POTASSIUM: 4.9 mmol/L (ref 3.5–5.1)
SODIUM: 136 mmol/L (ref 135–145)

## 2016-02-03 LAB — BRAIN NATRIURETIC PEPTIDE: B Natriuretic Peptide: 587.8 pg/mL — ABNORMAL HIGH (ref 0.0–100.0)

## 2016-02-03 MED ORDER — WARFARIN SODIUM 2 MG PO TABS
2.0000 mg | ORAL_TABLET | Freq: Every day | ORAL | Status: DC
  Administered 2016-02-03: 2 mg via ORAL
  Filled 2016-02-03: qty 1

## 2016-02-03 MED ORDER — ENSURE ENLIVE PO LIQD: 237.0000 mL | Freq: Three times a day (TID) | ORAL | 12 refills | Status: DC

## 2016-02-03 MED ORDER — FUROSEMIDE 40 MG PO TABS
40.0000 mg | ORAL_TABLET | Freq: Two times a day (BID) | ORAL | Status: DC
  Administered 2016-02-03: 40 mg via ORAL
  Filled 2016-02-03: qty 1

## 2016-02-03 MED ORDER — METOPROLOL TARTRATE 25 MG PO TABS: 25.0000 mg | ORAL_TABLET | Freq: Two times a day (BID) | ORAL | 12 refills | Status: AC

## 2016-02-03 MED ORDER — POTASSIUM CHLORIDE CRYS ER 20 MEQ PO TBCR: 40.0000 meq | EXTENDED_RELEASE_TABLET | Freq: Every day | ORAL | 12 refills | Status: DC

## 2016-02-03 MED ORDER — LOSARTAN POTASSIUM 25 MG PO TABS: 25.0000 mg | ORAL_TABLET | Freq: Every day | ORAL | 12 refills | Status: DC

## 2016-02-03 MED ORDER — FUROSEMIDE 40 MG PO TABS: 40.0000 mg | ORAL_TABLET | Freq: Two times a day (BID) | ORAL | 12 refills | Status: DC

## 2016-02-03 NOTE — Care Management Important Message (Signed)
Important Message  Patient Details  Name: Diamond Thomas MRN: [id number] Date of Birth: 1924-08-21   Medicare Important Message Given:  Yes    Lynell Greenhouse Abena 02/03/2016, 11:12 AM

## 2016-02-03 NOTE — Progress Notes (Signed)
PT Cancellation Note  Patient Details Name: CARRA COUSINO MRN: [id number] DOB: 23-Jan-1925   Cancelled Treatment:    Reason Eval/Treat Not Completed: Other (comment).  PT dressed and waiting on the ambulance to transport her to SNF.  Daughter in room.  Pt would prefer to rest while she waits.  PT to follow acutely until d/c confirmed.      Katara Griner, Claretta Fraise 02/03/2016, 3:57 PM

## 2016-02-03 NOTE — Discharge Summary (Signed)
Discharge Summary    Patient ID: Diamond Thomas,  MRN: [id number], DOB/AGE: 08-20-1924 82 y.o.  Admit date: 01/26/2016 Discharge date: 02/03/2016  Primary Care Provider: [facility] Primary Cardiologist: Dr. Herbie Baltimore  Discharge Diagnoses    Principal Problem:   Anasarca Active Problems:   Essential hypertension   High risk NST 08/2009, pt refused diagnostic heart cath, EF >55% by echo 08/2009   Chronic atrial fibrillation (HCC)   Protein malnutrition (HCC) - at this point I'm not exactly sure what her malnutrition status is.    Allergies Allergies  Allergen Reactions  . Pravachol [Pravastatin Sodium] Other (See Comments)    Myalgias  . Latex Itching    Diagnostic Studies/Procedures  Study Conclusions 01/27/16  - Left ventricle: The cavity size was normal. Wall thickness was   normal. Systolic function was normal. The estimated ejection   fraction was in the range of 60% to 65%. Wall motion was normal;   there were no regional wall motion abnormalities. The study is   not technically sufficient to allow evaluation of LV diastolic   function. - Ventricular septum: The contour showed diastolic flattening and   systolic flattening. - Aortic valve: Mildly calcified annulus. Trileaflet; mildly   calcified leaflets. - Mitral valve: Calcified annulus. There was mild regurgitation. - Left atrium: The atrium was moderately dilated. - Right ventricle: The cavity size was moderately to severely   dilated. Systolic function was mildly to moderately reduced. - Right atrium: The atrium was severely dilated. Central venous   pressure (est): 15 mm Hg. - Atrial septum: No defect or patent foramen ovale was identified. - Tricuspid valve: There was moderate regurgitation. - Pulmonary arteries: Systolic pressure was severely increased. PA   peak pressure: 70 mm Hg (S). - Pericardium, extracardiac: A trivial pericardial effusion was   identified.  Impressions:  - Normal LV  wall thickness with LVEF 60-65%. Indeterminate   diastolic function in the setting of atrial fibrillation. LV   septal flattening noted consistent with RV pressure and volume   overload. Moderate left atrial enlargement. MAC with mild mitral   regurgitation. Mildly sclerotic aortic valve. Moderate to severe   RV enlargement with moderately decreased contraction. Severe   right atrial enlargement with elevated estimated CVP. At least   moderate tricuspid regurgitation noted with evidence of severe   pulmonary hypertension and PASP estimated 70 mmHg. Trivial   pericardial effusion.  _____________   History of Present Illness   Diamond Thomas is a 82 year old female with a past medical history of permanent atrial fibrillation, chronic diastolic CHF, hypertension, and hyperlipidemia. She presented to the office for follow-up of her atrial fibrillation on 01/26/2016 and was noted to have significant bilateral lower extremity edema, with edema noted all the way up to her sacrum. She was admitted for adequate diuresis.  Hospital Course  She was admitted to [facility] and started on IV Lasix. Her admission weight was 123 pounds. She had 6 days of IV diuresis, discharge weight is 116 pounds. Creatinine remained stable throughout admission. Her discharge creatinine was 1.24, mildly elevated as today before it was 0.80. Likely increasing creatinine represents her being "dry". Would recommend BMP at the rehabilitation facility in 3 days.  She was transitioned to oral Lasix 40 mg twice a day, will continue to monitor renal function as above. Echo done this admission shows normal LVEF. She had some LV septal flattening consistent with elevated RV pressure and volume overload. Right atrium is severely dilated  and she has a PA peak pressure of 70 mmHg consistent with pulmonary hypertension. Likely the etiology of her pulmonary hypertension is due to her diastolic dysfunction as she has no smoking history and  pulmonary embolism is unlikely given that she has been chronically anticoagulated with warfarin.  She was started on losartan this admission, continue at 25mg  daily. She was hypotensive transiently and this prevented further uptitration of ARB.   She remained in atrial fibrillation throughout admission with rates controlled in the 80s to 90s. She will remain on metoprolol 25 mg twice a day for rate control. Continue Coumadin for anticoagulation. Discharge INR was therapeutic at 2.21.  She was noted to be deconditioned and weak throughout her admission. She lives alone and has a daughter who helps her periodically but it was felt that it was safer for her to move to a facility where she could receive 24-hour care.  She will follow-up with our office in the next 2-3 weeks. _____________  Discharge Vitals Blood pressure (!) 88/45, pulse 81, temperature 97.7 F (36.5 C), temperature source Oral, resp. rate (!) 21, height 5\' 2"  (1.575 m), weight 116 lb 4.8 oz (52.8 kg), SpO2 99 %.  Filed Weights   02/01/16 0501 02/02/16 0503 02/03/16 0342  Weight: 113 lb 8 oz (51.5 kg) 114 lb (51.7 kg) 116 lb 4.8 oz (52.8 kg)    Labs & Radiologic Studies     CBC  Recent Labs  02/02/16 0345  WBC 6.0  HGB 10.1*  HCT 31.6*  MCV 81.4  PLT 230   Basic Metabolic Panel  Recent Labs  02/02/16 0345 02/03/16 0413  NA 136 136  K 3.9 4.9  CL 96* 97*  CO2 32 31  GLUCOSE 103* 100*  BUN 46* 70*  CREATININE 0.80 1.24*  CALCIUM 9.2 9.2     Dg Chest Port 1 View  Result Date: 01/27/2016 CLINICAL DATA:  Shortness of breath on exertion. EXAM: PORTABLE CHEST 1 VIEW COMPARISON:  Radiographs of October 03, 2014. FINDINGS: Stable cardiomediastinal silhouette. No pneumothorax is noted. Interval development of moderate right pleural effusion with probable underlying atelectasis or infiltrate. Minimal left basilar subsegmental atelectasis is noted. Bony thorax is unremarkable. IMPRESSION: Interval development of  moderate right pleural effusion with probable underlying atelectasis or infiltrate. Electronically Signed   By: Lupita Raider, M.D.   On: 01/27/2016 07:22   Disposition   Pt is being discharged home today in good condition.  Follow-up Plans & Appointments    Follow-up Information    Azalee Course, [place] Follow up on 02/17/2016.   Specialties:  Cardiology, Radiology Why:  at 1:30pm with Dr. Elissa Hefty PA for hospital follow up  Contact information: [address] [place] [address] [phone]          Discharge Instructions    Diet - low sodium heart healthy    Complete by:  As directed   Increase activity slowly    Complete by:  As directed      Discharge Medications   Current Discharge Medication List    START taking these medications   Details  feeding supplement, ENSURE ENLIVE, (ENSURE ENLIVE) LIQD Take 237 mLs by mouth 3 (three) times daily between meals. Qty: 237 mL, Refills: 12    losartan (COZAAR) 25 MG tablet Take 1 tablet (25 mg total) by mouth daily. Qty: 30 tablet, Refills: 12      CONTINUE these medications which have CHANGED   Details  furosemide (LASIX) 40 MG tablet  Take 1 tablet (40 mg total) by mouth 2 (two) times daily. Qty: 60 tablet, Refills: 12    metoprolol tartrate (LOPRESSOR) 25 MG tablet Take 1 tablet (25 mg total) by mouth 2 (two) times daily. Qty: 60 tablet, Refills: 12    potassium chloride SA (K-DUR,KLOR-CON) 20 MEQ tablet Take 2 tablets (40 mEq total) by mouth daily. Qty: 30 tablet, Refills: 12      CONTINUE these medications which have NOT CHANGED   Details  Acetaminophen (TYLENOL EXTRA STRENGTH PO) Take 1 tablet by mouth daily as needed.    Calcium Carb-Cholecalciferol (CALCIUM 600 + D PO) Take 1 tablet by mouth 2 (two) times daily.    loratadine (CLARITIN) 10 MG tablet Take 10 mg by mouth daily.    Magnesium 250 MG TABS Take 1 tablet by mouth daily.    NITROSTAT 0.4 MG SL tablet TAKE 1 TABLET UNDER TONGUE EVERY  5 MINUTES AS NEEDED FOR CHEST PAIN Qty: 25 tablet, Refills: 1    Omega-3 Fatty Acids (FISH OIL) 1000 MG CAPS Take 1 capsule by mouth 2 (two) times daily.    triamcinolone cream (KENALOG) 0.1 %     warfarin (COUMADIN) 2 MG tablet Take 2 mg by mouth every morning.            Outstanding Labs/Studies  BMP to be done by SNF.   Duration of Discharge Encounter   Greater than 30 minutes including physician time.  Signed, Little Ishikawa NP 02/03/2016, 12:47 PM

## 2016-02-03 NOTE — Progress Notes (Signed)
attempted to call report to nursing home number provided by Child psychotherapist. Call was answered by 2 separate people. I was unable to give report due to being left on hold.

## 2016-02-03 NOTE — Clinical Social Work Placement (Signed)
   CLINICAL SOCIAL WORK PLACEMENT  NOTE  Date:  02/03/2016  Patient Details  Name: Diamond Thomas MRN: [id number] Date of Birth: September 11, 1924  Clinical Social Work is seeking post-discharge placement for this patient at the Skilled  Nursing Facility level of care (*CSW will initial, date and re-position this form in  chart as items are completed):  Yes   Patient/family provided with [facility] Clinical Social Work Department's list of facilities offering this level of care within the geographic area requested by the patient (or if unable, by the patient's family).  Yes   Patient/family informed of their freedom to choose among providers that offer the needed level of care, that participate in Medicare, Medicaid or managed care program needed by the patient, have an available bed and are willing to accept the patient.  Yes   Patient/family informed of [facility]'s ownership interest in [facility] and [facility], as well as of the fact that they are under no obligation to receive care at these facilities.  PASRR submitted to EDS on 01/31/16     PASRR number received on 01/31/16     Existing PASRR number confirmed on       FL2 transmitted to all facilities in geographic area requested by pt/family on 01/31/16     FL2 transmitted to all facilities within larger geographic area on       Patient informed that his/her managed care company has contracts with or will negotiate with certain facilities, including the following:        Yes   Patient/family informed of bed offers received.  Patient chooses bed at [facility]     Physician recommends and patient chooses bed at      Patient to be transferred to [facility] on 02/03/16.  Patient to be transferred to facility by [facility]     Patient family notified on 02/03/16 of transfer.  Name of family member notified:  Pt's daughter     PHYSICIAN Please prepare priority discharge summary, including  medications, Please prepare prescriptions, Please sign FL2     Additional Comment:    _______________________________________________ Dede Query, LCSW 02/03/2016, 1:58 PM

## 2016-02-03 NOTE — Clinical Social Work Note (Signed)
Pt is ready for discharge today to [facility]. Facility is ready to admit pt as they have received discharge information. Pt's daughter is aware and agreeable to discharge plan. RN will call report. [facility] will provide transportation. CSW is signing off as no further needs identified.   RN Report Information Report # [id number] Room# [id number]  Dede Query, MSW, [place]  Clinical Social Worker  [phone]

## 2016-02-03 NOTE — Progress Notes (Signed)
Patient Name: Diamond Thomas Date of Encounter: 02/03/2016  Principal Problem:   Anasarca Active Problems:   Essential hypertension   High risk NST 08/2009, pt refused diagnostic heart cath, EF >55% by echo 08/2009   Chronic atrial fibrillation (HCC)   Protein malnutrition (HCC) - at this point I'm not exactly sure what her malnutrition status is.    Primary Cardiologist: Dr. Herbie Baltimore Patient Profile: Diamond Thomas is a 82 year old female with chronic Afib, chronic diastolic CHF, HTN, HLD and CAD. She had a right risk stress test in 2011, refused intervention. Admitted on 01/26/16 with acute on chronic CHF. Presented to the office with pitting edema and admitted for IV diuresis.    SUBJECTIVE: Feels weak, no SOB. Has some back pain this morning.   OBJECTIVE Vitals:   02/02/16 1728 02/02/16 1923 02/02/16 2154 02/03/16 0342  BP: (!) 100/46 (!) 96/43 (!) 118/55 (!) 110/51  Pulse:  94 91 82  Resp: (!) 30 20  (!) 28  Temp:  97.8 F (36.6 C)  97.7 F (36.5 C)  TempSrc:  Oral  Oral  SpO2:  98%  99%  Weight:    116 lb 4.8 oz (52.8 kg)  Height:        Intake/Output Summary (Last 24 hours) at 02/03/16 1610 Last data filed at 02/03/16 0000  Gross per 24 hour  Intake             1314 ml  Output              451 ml  Net              863 ml   Filed Weights   02/01/16 0501 02/02/16 0503 02/03/16 0342  Weight: 113 lb 8 oz (51.5 kg) 114 lb (51.7 kg) 116 lb 4.8 oz (52.8 kg)    PHYSICAL EXAM General: Well developed, well nourished, female in no acute distress. Head: Normocephalic, atraumatic.  Neck: Supple without bruits, no JVD. Lungs:  Resp regular and unlabored, CTA. Heart: RRR, S1, S2, no S3, S4, or murmur; no rub. Abdomen: Soft, non-tender, non-distended, BS + x 4.  Extremities: No clubbing, cyanosis, generalized edema.  Neuro: Alert and oriented X 3. Moves all extremities spontaneously. Psych: Normal affect.  LABS: CBC: Recent Labs  02/02/16 0345  WBC 6.0  HGB 10.1*    HCT 31.6*  MCV 81.4  PLT 230   INR: Recent Labs  02/03/16 0413  INR 2.21   Basic Metabolic Panel: Recent Labs  02/02/16 0345 02/03/16 0413  NA 136 136  K 3.9 4.9  CL 96* 97*  CO2 32 31  GLUCOSE 103* 100*  BUN 46* 70*  CREATININE 0.80 1.24*  CALCIUM 9.2 9.2   BNP:  B Natriuretic Peptide  Date/Time Value Ref Range Status  02/03/2016 04:03 AM 587.8 (H) 0.0 - 100.0 pg/mL Final  02/01/2016 04:01 AM 790.6 (H) 0.0 - 100.0 pg/mL Final     Current Facility-Administered Medications:  .  0.9 %  sodium chloride infusion, 250 mL, Intravenous, PRN, Marykay Lex, MD .  acetaminophen (TYLENOL) tablet 650 mg, 650 mg, Oral, Q4H PRN, Marykay Lex, MD, 650 mg at 02/02/16 1557 .  feeding supplement (ENSURE ENLIVE) (ENSURE ENLIVE) liquid 237 mL, 237 mL, Oral, TID BM, Marykay Lex, MD, 237 mL at 02/02/16 2035 .  furosemide (LASIX) tablet 60 mg, 60 mg, Oral, BID, Lennette Bihari, MD, 60 mg at 02/02/16 2035 .  loratadine (CLARITIN) tablet 10  mg, 10 mg, Oral, Daily, Lennart Pall [place], [place], 10 mg at 02/02/16 0901 .  losartan (COZAAR) tablet 25 mg, 25 mg, Oral, Daily, Lennette Bihari, MD, 25 mg at 02/02/16 0902 .  metoprolol tartrate (LOPRESSOR) tablet 25 mg, 25 mg, Oral, Q12H, Lennette Bihari, MD, 25 mg at 02/02/16 2154 .  nitroGLYCERIN (NITROSTAT) SL tablet 0.4 mg, 0.4 mg, Sublingual, Q5 min PRN, Ellsworth Lennox, PA .  ondansetron [facility]) injection 4 mg, 4 mg, Intravenous, Q6H PRN, Marykay Lex, MD .  potassium chloride SA (K-DUR,KLOR-CON) CR tablet 20 mEq, 20 mEq, Oral, BID, Ryan M Dunn, PA-C, 20 mEq at 02/02/16 2154 .  sodium chloride flush (NS) 0.9 % injection 3 mL, 3 mL, Intravenous, Q12H, Marykay Lex, MD, 3 mL at 02/02/16 0906 .  sodium chloride flush (NS) 0.9 % injection 3 mL, 3 mL, Intravenous, PRN, Marykay Lex, MD .  Warfarin - Pharmacist Dosing Inpatient, , Does not apply, q1800, Marquita Palms, [facility]   TELE:   Afib, rate controlled.     Echo: 01/27/16 Study  Conclusions  - Left ventricle: The cavity size was normal. Wall thickness was normal. Systolic function was normal. The estimated ejection fraction was in the range of 60% to 65%. Wall motion was normal; there were no regional wall motion abnormalities. The study is not technically sufficient to allow evaluation of LV diastolic function. - Ventricular septum: The contour showed diastolic flattening and systolic flattening. - Aortic valve: Mildly calcified annulus. Trileaflet; mildly calcified leaflets. - Mitral valve: Calcified annulus. There was mild regurgitation. - Left atrium: The atrium was moderately dilated. - Right ventricle: The cavity size was moderately to severely dilated. Systolic function was mildly to moderately reduced. - Right atrium: The atrium was severely dilated. Central venous pressure (est): 15 mm Hg. - Atrial septum: No defect or patent foramen ovale was identified. - Tricuspid valve: There was moderate regurgitation. - Pulmonary arteries: Systolic pressure was severely increased. PA peak pressure: 70 mm Hg (S). - Pericardium, extracardiac: A trivial pericardial effusion was identified.  Impressions:  - Normal LV wall thickness with LVEF 60-65%. Indeterminate diastolic function in the setting of atrial fibrillation. LV septal flattening noted consistent with RV pressure and volume overload. Moderate left atrial enlargement. MAC with mild mitral regurgitation. Mildly sclerotic aortic valve. Moderate to severe RV enlargement with moderately decreased contraction. Severe right atrial enlargement with elevated estimated CVP. At least moderate tricuspid regurgitation noted with evidence of severe pulmonary hypertension and PASP estimated 70 mmHg. Trivial pericardial effusion.   Current Medications:  . feeding supplement (ENSURE ENLIVE)  237 mL Oral TID BM  . furosemide  60 mg Oral BID  . loratadine  10 mg Oral  Daily  . losartan  25 mg Oral Daily  . metoprolol tartrate  25 mg Oral Q12H  . potassium chloride  20 mEq Oral BID  . sodium chloride flush  3 mL Intravenous Q12H  . Warfarin - Pharmacist Dosing Inpatient   Does not apply q1800      ASSESSMENT AND PLAN: Principal Problem:   Anasarca Active Problems:   Essential hypertension   High risk NST 08/2009, pt refused diagnostic heart cath, EF >55% by echo 08/2009   Chronic atrial fibrillation (HCC)   Protein malnutrition (HCC) - at this point I'm not exactly sure what her malnutrition status is.   1. Acute on chronic diastolic ZOX:[id number] 10.2 Lliters for admission and weight down 7lbs (123-->116). Echo this admission shows normal LVEF of  60-65%, LV septal flattening consistent with elevated RV pressure and volume overload. Right atrium severely dilated. PA peak pressure is . Suspect that the etiology of her pulmonary hypertension is due to diastolic dysfunction. She has no smoking history and pulmonary embolism is unlikely given that she has been chronically anticoagulatedwith warfarin. She is not interested in procedures such as RHC. Losartan was added this admission, had some soft BP's yesterday, this am BP is 110/51, will probably hold off on increasing dose further.   Suspect that she is euvolemic now, creatinine is 1.24 today, up from 0.8 yesterday. MD to advise on Lasix dose at discharge.   2. Chronic atrial fibrillation: Rates are well-controlled. Continue warfarin and metoprolol. INR 1.86 today. Metoprolol was increased yesterday to 25mg  BID.   This patients CHA2DS2-VASc Score and unadjusted Ischemic Stroke Rate (% per year) is equal to 11.2 % stroke rate/year from a score of 7(CHF, HTN, DM, Vascular, Female, Age (2)). Continue Coumadin per pharmacy consult.   3. HTN: Well controlled on beta blocker and ARB, continue same.   4. High risk NST 08/2009, pt refused diagnostic heart cath, EF >55% by echo 08/2009. LVEF remains  >55%: She likely has known CAD but has refused a cardiac catheterization. This was again discussed with the patient and her daughter by Dr. Herbie Baltimore in the office and she doesnot want invasive evaluations with heart catheterization etc.   5. Protein malnutrition: Evaluated by nutrition and recommended Ensure Enlive tid. Concern heranasarca may be partly related to hypoalbuminemia.  Signed, Little Ishikawa , NP 7:22 AM 02/03/2016 Pager [phone]   Patient seen and examined. Agree with assessment and plan.BNP improved to 587 today. Cr 1.24 with GFR 37 c/w stage 3 CKD. Will decrease furosemide to 40 mg bid.  Probably can dc today to [facility] for short tern SNF and plan ov f/u with Dr. Herbie Baltimore.  Lennette Bihari, MD, [facility] 02/03/2016 9:34 AM

## 2016-02-03 NOTE — Progress Notes (Signed)
Report received in patient's room via Jessica RN using SBAR format, reviewed orders, labs, VS, meds and patient's general condition, assumed care of patient. 

## 2016-02-03 NOTE — Progress Notes (Signed)
ANTICOAGULATION CONSULT NOTE - Follow Up Consult  Pharmacy Consult for Coumadin Indication: atrial fibrillation  Allergies  Allergen Reactions  . Pravachol [Pravastatin Sodium] Other (See Comments)    Myalgias  . Latex Itching    Patient Measurements: Height: 5\' 2"  (157.5 cm) Weight: 116 lb 4.8 oz (52.8 kg) IBW/kg (Calculated) : 50.1  Vital Signs: Temp: 97.7 F (36.5 C) (08/04 0342) Temp Source: Oral (08/04 0342) BP: 88/45 (08/04 0901) Pulse Rate: 81 (08/04 0901)  Labs:  Recent Labs  02/01/16 0400 02/02/16 0345 02/03/16 0413  HGB  --  10.1*  --   HCT  --  31.6*  --   PLT  --  230  --   LABPROT 20.4* 21.7* 24.9*  INR 1.72 1.86 2.21  CREATININE 0.79 0.80 1.24*    Estimated Creatinine Clearance: 24.3 mL/min (by C-G formula based on SCr of 1.24 mg/dL).  Assessment: Coumadin PTA for afib continuing inpatient. CHA2DS2-VASc=7. INR therapeutic at 2.21 today, CBC stable.  No overt bleeding reported. Coumadin 5 mg dose was NOT charted as given on 8/2.  PTA warfarin dose: 2mg  daily  Goal of Therapy:  INR 2-3 Monitor platelets by anticoagulation protocol: Yes   Plan:  Resume home dose of coumadin 2 mg daily -Daily PT/INR  -Monitor CBC, s/sx bleeding  Herby Abraham, Pharm.D. [phone] 02/03/2016 11:03 AM

## 2016-02-04 LAB — PROTIME-INR: Protime: 24 seconds — AB (ref 10.0–13.8)

## 2016-02-04 LAB — BASIC METABOLIC PANEL
BUN: 20 mg/dL (ref 4–21)
CREATININE: 1.2 mg/dL — AB (ref 0.5–1.1)
GLUCOSE: 97 mg/dL
POTASSIUM: 4.9 mmol/L (ref 3.4–5.3)
SODIUM: 137 mmol/L (ref 137–147)

## 2016-02-04 LAB — POCT INR: INR: 2.3 — AB (ref 0.9–1.1)

## 2016-02-08 LAB — CBC AND DIFFERENTIAL
HEMATOCRIT: 29 % — AB (ref 36–46)
HEMOGLOBIN: 9.4 g/dL — AB (ref 12.0–16.0)
Neutrophils Absolute: 4422 /uL
Platelets: 211 10*3/uL (ref 150–399)
WBC: 6.7 10*3/mL

## 2016-02-08 LAB — HEPATIC FUNCTION PANEL
ALK PHOS: 129 U/L — AB (ref 25–125)
ALT: 25 U/L (ref 7–35)
AST: 36 U/L — AB (ref 13–35)
Bilirubin, Total: 1.2 mg/dL

## 2016-02-08 LAB — BASIC METABOLIC PANEL
BUN: 53 mg/dL — AB (ref 4–21)
CREATININE: 0.9 mg/dL (ref 0.5–1.1)
Glucose: 98 mg/dL
Potassium: 4.3 mmol/L (ref 3.4–5.3)
Sodium: 137 mmol/L (ref 137–147)

## 2016-02-08 LAB — PROTIME-INR: Protime: 22.6 seconds — AB (ref 10.0–13.8)

## 2016-02-08 LAB — POCT INR: INR: 2.2 — AB (ref 0.9–1.1)

## 2016-02-09 ENCOUNTER — Encounter: Payer: Self-pay | Admitting: Internal Medicine

## 2016-02-09 ENCOUNTER — Non-Acute Institutional Stay (SKILLED_NURSING_FACILITY): Payer: Medicare Other | Admitting: Internal Medicine

## 2016-02-09 DIAGNOSIS — R931 Abnormal findings on diagnostic imaging of heart and coronary circulation: Secondary | ICD-10-CM | POA: Diagnosis not present

## 2016-02-09 DIAGNOSIS — N289 Disorder of kidney and ureter, unspecified: Secondary | ICD-10-CM

## 2016-02-09 DIAGNOSIS — E46 Unspecified protein-calorie malnutrition: Secondary | ICD-10-CM | POA: Diagnosis not present

## 2016-02-09 DIAGNOSIS — I10 Essential (primary) hypertension: Secondary | ICD-10-CM

## 2016-02-09 DIAGNOSIS — I482 Chronic atrial fibrillation, unspecified: Secondary | ICD-10-CM

## 2016-02-09 DIAGNOSIS — I2721 Secondary pulmonary arterial hypertension: Secondary | ICD-10-CM | POA: Insufficient documentation

## 2016-02-09 DIAGNOSIS — R601 Generalized edema: Secondary | ICD-10-CM | POA: Diagnosis not present

## 2016-02-09 HISTORY — DX: Disorder of kidney and ureter, unspecified: N28.9

## 2016-02-09 NOTE — Progress Notes (Signed)
This is a comprehensive admission note to [facility] performed on this date less than 30 days from date of admission. Included are preadmission medical/surgical history;reconciled medication list; family history; social history and comprehensive review of systems.  Corrections and additions to the records were documented . Comprehensive physical exam was also performed. Additionally a clinical summary was entered for each active diagnosis pertinent to this admission in the Problem List to enhance continuity of care.  PCP:Dr Ajith Ramachandran,[place]  HPI: The patient was hospitalized 7/ 27-02/03/16 for anasarca in context of chronic congestive heart failure.BNP was as high as 790.6. She received aggressive diuresis. Initial weight was 123 pounds. At discharge she weighed 116. She was discharged on Lasix 40 mg twice a day. She also received losartan 25 mg daily. Relative hypotension on this dose in combination with metoprolol 25 mg twice a day precluded adjusting dose the dose higher. She has chronic a fibrillation but the pulse rate was well controlled in the range of 80-90. She was felt to have protein malnutrition.  She also had renal insufficiency with creatinine of 1.24 and GFR 37. Chest x-ray portably 7/28 revealed massive right effusion essentially halfway up the right thorax as well as cephalization of flow. 2D echocardiogram on the same date revealed normal left ventricular systolic function. They were unable to define left ventricular diastolic function. Moderate-severe right ventricular enlargement was present with severe right atrial enlargement. There was  moderate tricuspid valve regurgitation. She had significant pulmonary hypertension with a PA pressure 70 mm. She was discharged to the nursing facility for rehab.  Past medical and surgical history: Medical diagnoses include hypertension,and dyslipidemia.  Social history:verified  Family history:updated  Review of  systems:Could not be vereified due to dementia. Date given as October?,2017. Unable to identify President. She has intermittent dyspnea on exertion. She states her edema is better overnight. She has occasional right knee pain. She has intermittent numbness in her extremities. Cardiovascular: No chest pain, palpitations,paroxysmal nocturnal dyspnea, claudication Respiratory: No cough, sputum production,hemoptysis,  significant snoring,apnea   Gastrointestinal: No heartburn,dysphagia,abdominal pain, nausea / vomiting,rectal bleeding, melena,change in bowels Genitourinary: No dysuria,hematuria, pyuria,  incontinence, nocturia Dermatologic: No rash, pruritus, change in appearance of skin Neurologic: No dizziness,headache,syncope, seizures,  tingling Hematologic/lymphatic: No significant bruising, lymphadenopathy,abnormal bleeding   Physical exam:  Pertinent or positive findings: She is very frail, she ambulates with a walker. She exhibits dyspnea with minimal exertion.  Dental hygiene is fair-good.  Heart rhythm is slow and irregular. Breath sounds are decreased at the right base approximately a third of the way up the posterior thorax. There is no definite whispered egophony. No asymmetry in tactile fremitus testing. She has 1+ pitting edema to the upper shin. There is stasis hyperpigmentation over the lower legs. Fusiform enlargement of the right knee is present with valgus deformity of the lower extremities . Mixed hand arthritic changes, mainly DIP deformities. There's a large varicose vein over the dorsum of the fifth right finger.  General appearance: no acute distress  Lymphatic: No lymphadenopathy about the head, neck, axilla . Eyes: No conjunctival inflammation or lid edema is present. There is no scleral icterus. Ears:  External ear exam shows no significant lesions or deformities.   Nose:  External nasal examination shows no deformity or inflammation. Nasal mucosa are pink and  moist without lesions ,exudates Oral exam: lips and gums are healthy appearing.There is no oropharyngeal erythema or exudate . Neck:  No thyromegaly, masses, tenderness noted.    Heart:  No gallop, murmur, click, rub .  Lungs: without wheezes, rhonchi,rales , rubs. Abdomen:Bowel sounds are normal. Abdomen is distended but soft and nontender with no organomegaly, hernias,masses. GU: deferred  Extremities:  No cyanosis, clubbing Neurologic exam : Strength decreased  in upper & lower extremities Balance,Rhomberg,finger to nose testing could not be completed due to clinical state Deep tendon reflexes are equal but 0+ Skin: Warm & dry w/o tenting. No significant lesions or rash.  See clinical summary under each active problem in the Problem List with associated updated therapeutic plan

## 2016-02-09 NOTE — Assessment & Plan Note (Signed)
Rate control is adequate on present regimen

## 2016-02-09 NOTE — Assessment & Plan Note (Signed)
Blood pressure well controlled on metoprolol 25 mg twice a day and low-dose losartan 25 mg daily Unable to tolerate higher dose of ARB due to hypotension

## 2016-02-09 NOTE — Patient Instructions (Addendum)
Goals will be to maintain a weight at 116 or less and attain  a low normal albumin. If weight increases by 5 pounds, BNP is recommended with repeat portable chest Xray.  It would enhance continuity of care were we able to compare the 8/9 portable nursing facility x-ray with the 7/28 film while hospitalized.     The designation "Full Code" subjects the patient to external cardiac compression, endotracheal intubation, and mechanical ventilator support.    In the presence of advanced and serious comorbid conditions as are present; no study has shown any benefit for these interventions. Unfortunately it simply prolongs the process of dying and and painfully so.    In fact external chest compression unfortunately will result in multiple rib and possibly sternum fractures with associated severe pain.     I strongly recommend that a "Limited Code" be considered. This would entail treating reversible processes such as serious heart irregularities with medications or even cardioversion; medication for pain control; and antibiotics for infections.    The emphasis should be on providing her with comfort and dignity.

## 2016-02-09 NOTE — Assessment & Plan Note (Signed)
Continue sodium restriction Furosemide 40 mg twice a day Weight goal is less than 116

## 2016-02-09 NOTE — Assessment & Plan Note (Signed)
  8/9 creatinine 0.87

## 2016-02-09 NOTE — Assessment & Plan Note (Signed)
Goal is low normal albumin 02/08/16 albumin 3.0 (3.6-5.1)

## 2016-02-14 ENCOUNTER — Encounter: Payer: Self-pay | Admitting: Internal Medicine

## 2016-02-14 ENCOUNTER — Non-Acute Institutional Stay (SKILLED_NURSING_FACILITY): Payer: Medicare Other | Admitting: Internal Medicine

## 2016-02-14 DIAGNOSIS — R601 Generalized edema: Secondary | ICD-10-CM

## 2016-02-14 DIAGNOSIS — I482 Chronic atrial fibrillation, unspecified: Secondary | ICD-10-CM

## 2016-02-14 NOTE — Assessment & Plan Note (Signed)
Rate controlled, no change in medication

## 2016-02-14 NOTE — Assessment & Plan Note (Signed)
Home oxygen as outlined above Low-sodium diet Oral protein supplementation Follow-up with cardiology in 2-3 weeks

## 2016-02-14 NOTE — Patient Instructions (Addendum)
Because of severely elevated pressures in the right side of the heart and pulmonary artery; oxygen 24 hours a day is necessary. It can be 1 L while sitting at rest ; but should be increased to 2 L with ambulation as well as at night as Oxygen levels typically drop at night.  Food should not be cooked with salt or salt added at the table.Cook with pepper & other spices . Use the salt substitute "No Salt" OR the Mrs Sharilyn SitesDash products to season food @ the table. Avoid foods which taste salty or "vinegary" as their sodium content will be high.  Protein supplement such as Ensure or Boost daily is necessary to correct the low albumin and protein. Low albumin can be associated with progressive swelling.

## 2016-02-14 NOTE — Progress Notes (Signed)
   Facility Location: Room Number:  PCP: Georgianne FickAMACHANDRAN,AJITH, MD [address] [place] [id number]  Code Status: Full Code  The patient is being [facility] on this date by Marga MelnickWilliam Kaelee Pfeffer MD.   The medical history in this facility was reviewed and summarized and medical problem list was updated. Time spent and note content is documented as follows.  Summary of [facility] records: [facility] after being hospitalized 7/27-8/4 for anasarca in the context of chronic congestive heart failure. The BNP was high as 791. With aggressive diuresis weight dropped from 123 down to 116. He was discharged on Lasix 40 mg twice daily. She has chronic atrial fibrillation with well-controlled ventricular rate. A contributory factor to the anasarca was felt to be protein malnutrition. Albumin was 2.9 on 7/27.  Chest x-ray 7/28 revealed a massive right pleural effusion essentially halfway up the right thorax with cephalization of flow. The patient is on warfarin, thoracentesis was not pursued.  Echocardiogram 7/28 revealed normal left ventricular systolic function. She had moderate-severe right ventricular enlargement with severe right atrial enlargement. Moderate tricuspid valve regurgitation was present. Pulmonary pressures were 70 ( normal 15-25). With ambulation she was noted to desaturate to 83% on room air. Supplemental oxygen was prescribed. Albumin supplementation was pursued. PT/INR was therapeutic at 2.4. With potassium 20 mEq twice a day the potassium was 4.3 on 8/9. As noted she's on furosemide 40 mg twice a day.  Review of systems: She denies any active cardiopulmonary symptoms except for exertional dyspnea. Cardiovascular: No chest pain, palpitations,paroxysmal nocturnal dyspnea, claudication Respiratory: No cough, sputum production,hemoptysis,  significant snoring,apnea   Gastrointestinal:  No heartburn,dysphagia,abdominal pain, nausea / vomiting,rectal bleeding, melena,change in bowels Genitourinary: No dysuria,hematuria, pyuria,  incontinence, nocturia Dermatologic: No rash, pruritus, change in appearance of skin Neurologic: No dizziness,headache,syncope, seizures, numbness , tingling Psychiatric: No significant anxiety , depression, insomnia, anorexia  Pertinent or positive findings include: She appears frail and fatigued. Heart rhythm and rate are slow and irregular. Bronchovesicular breath sounds are present in right lower thorax posteriorly. She has 1+ pitting edema to mid shin. Pedal pulses are decreased. She has mixed arthritic changes in the hands. She is much more alert ,able to identify the month and year. She named her daughter's DOB except for the year.  General appearance :adequately nourished; in no distress. Eyes: No conjunctival inflammation or scleral icterus is present. Oral exam:  Lips and gums are healthy appearing.There is no oropharyngeal erythema or exudate noted. Dental hygiene is fair-good.  Heart:  No gallop, murmur, click, rub or other extra sounds  Lungs:Chest clear to auscultation; no wheezes, rhonchi,rales ,or rubs present.No increased work of breathing.  Abdomen: bowel sounds normal, soft and non-tender without masses, organomegaly or hernias noted.  No guarding or rebound.  Vascular : all pulses equal ; no bruits present. Skin:Warm & dry.  Intact without suspicious lesions or rashes ; no tenting or jaundice . There is faint erythematous  hyperpigmentation of the shins.  Lymphatic: No lymphadenopathy is noted about the head, neck, axilla Neuro: Strength, tone decreased   See clinical summary of Discharge Diagnoses in the Problem List with associated updated therapeutic plan  Discharge instructions were written and discharge instructions provided. Follow-up will be by the primary care physician in 10 - 14 days.

## 2016-02-16 ENCOUNTER — Telehealth: Payer: Self-pay | Admitting: Cardiology

## 2016-02-16 NOTE — Telephone Encounter (Signed)
Returned call. Daughter explains that patient was [facility] yesterday, came home w O2 via [place]. She thinks the O2 may be drying her nose out too much - she is aware this could introduce increased change of nosebleed, and in fact patient had a single episode of epistaxis that resolved quickly. She is also concerned that patient has not had coumadin checked "in a while".  Reviewed chart - pt had INR level drawn 1 week ago - appears stable compared to previous tests.   She sees Azalee CourseHao Meng tomorrow for follow up. Daughter aware we can check INR in office tomorrow if needed, also assess for suitability of humidified O2 I advised to continue warfarin & other meds at current dosing and no further action at this time, follow up tomorrow in office. She voiced understanding and thanks.

## 2016-02-16 NOTE — Telephone Encounter (Signed)
New message    Pt daughter verbalized that she had a nose bleed last night and she just started on 02 and she said that she also don't know if it came from her being on COUM.

## 2016-02-17 ENCOUNTER — Encounter: Payer: Self-pay | Admitting: Physician Assistant

## 2016-02-17 ENCOUNTER — Ambulatory Visit (INDEPENDENT_AMBULATORY_CARE_PROVIDER_SITE_OTHER): Payer: Medicare Other | Admitting: Pharmacist

## 2016-02-17 ENCOUNTER — Ambulatory Visit (INDEPENDENT_AMBULATORY_CARE_PROVIDER_SITE_OTHER): Payer: Medicare Other | Admitting: Physician Assistant

## 2016-02-17 VITALS — BP 100/60 | HR 86 | Ht 62.0 in | Wt 130.8 lb

## 2016-02-17 DIAGNOSIS — I10 Essential (primary) hypertension: Secondary | ICD-10-CM | POA: Diagnosis not present

## 2016-02-17 DIAGNOSIS — I509 Heart failure, unspecified: Secondary | ICD-10-CM

## 2016-02-17 DIAGNOSIS — E46 Unspecified protein-calorie malnutrition: Secondary | ICD-10-CM

## 2016-02-17 DIAGNOSIS — I50811 Acute right heart failure: Secondary | ICD-10-CM

## 2016-02-17 DIAGNOSIS — D649 Anemia, unspecified: Secondary | ICD-10-CM

## 2016-02-17 DIAGNOSIS — I482 Chronic atrial fibrillation, unspecified: Secondary | ICD-10-CM

## 2016-02-17 DIAGNOSIS — E785 Hyperlipidemia, unspecified: Secondary | ICD-10-CM | POA: Diagnosis not present

## 2016-02-17 LAB — BASIC METABOLIC PANEL WITH GFR
BUN: 67 mg/dL — AB (ref 7–25)
CHLORIDE: 106 mmol/L (ref 98–110)
CO2: 21 mmol/L (ref 20–31)
Calcium: 9.4 mg/dL (ref 8.6–10.4)
Creat: 1.14 mg/dL — ABNORMAL HIGH (ref 0.60–0.88)
GFR, EST NON AFRICAN AMERICAN: 43 mL/min — AB (ref >=60)
GFR, Est African American: 49 mL/min — ABNORMAL LOW (ref >=60)
Glucose, Bld: 89 mg/dL (ref 65–99)
POTASSIUM: 5.2 mmol/L (ref 3.5–5.3)
Sodium: 138 mmol/L (ref 135–146)

## 2016-02-17 LAB — CBC
HEMATOCRIT: 32.2 % — AB (ref 35.0–45.0)
HEMOGLOBIN: 10.3 g/dL — AB (ref 11.7–15.5)
MCH: 26.1 pg — ABNORMAL LOW (ref 27.0–33.0)
MCHC: 32 g/dL (ref 32.0–36.0)
MCV: 81.7 fL (ref 80.0–100.0)
MPV: 9.9 fL (ref 7.5–12.5)
Platelets: 282 10*3/uL (ref 140–400)
RBC: 3.94 MIL/uL (ref 3.80–5.10)
RDW: 17.1 % — ABNORMAL HIGH (ref 11.0–15.0)
WBC: 6.2 10*3/uL (ref 3.8–10.8)

## 2016-02-17 MED ORDER — FUROSEMIDE 80 MG PO TABS: 80.0000 mg | ORAL_TABLET | Freq: Two times a day (BID) | ORAL | 6 refills | Status: DC

## 2016-02-17 MED ORDER — POTASSIUM CHLORIDE CRYS ER 20 MEQ PO TBCR: 40.0000 meq | EXTENDED_RELEASE_TABLET | Freq: Two times a day (BID) | ORAL | 6 refills | Status: DC

## 2016-02-17 MED ORDER — POTASSIUM CHLORIDE CRYS ER 20 MEQ PO TBCR: 40.0000 meq | EXTENDED_RELEASE_TABLET | Freq: Every day | ORAL | 6 refills | Status: DC

## 2016-02-17 NOTE — Telephone Encounter (Signed)
Spoke to patient daughter regarding potassium. Informed her she is supposed to take 40mg  BID = 80 mg daily. Daughter verbalized understanding.

## 2016-02-17 NOTE — Telephone Encounter (Signed)
Routed to primary  

## 2016-02-17 NOTE — Telephone Encounter (Signed)
She has been added to our coumadin schedule to ensure appropriate follow up planned. Thanks!

## 2016-02-17 NOTE — Progress Notes (Signed)
Cardiology Office Note    Date:  02/17/2016   ID:  Diamond Thomas, DOB [birth year], MRN [id number]  PCP:  Georgianne Fick, MD  Cardiologist:  Dr. Herbie Baltimore  Chief Complaint  Patient presents with  . Hospitalization Follow-up    seen for Dr. Herbie Baltimore    History of Present Illness:  Diamond Thomas is a 82 y.o. female with PMH of chronic afib on coumadin, chronic diastolic HF, HTN and HLD. When she was first diagnosed with atrial fibrillation back in 2011, she had moderate to high risk abnormal Myoview stress test showing medium-sized moderate severity perfusion defect in the basal inferior and inferior wall with moderate reversibility. She did not wish to undergo coronary angiography at the time. Echocardiogram obtained in 2011 showed normal EF. In the recent month, she has been having increased pitting edema. She was seen in the clinic on 01/26/2016 at which time she appears to have worsening sign or symptom of acute heart failure.   She was admitted to the hospital from 7/27-8/4 for IV diuresis. The edema was felt to be right heart failure with protein malnutrition with albumin 2.9. Echocardiogram was repeated on 01/27/2016 which showed EF 60-65%, moderately to severely dilated right ventricle with RVEF mildly to moderately reduced, PA peak pressure 70 mmHg, mitral annular calcification with mild mitral regurgitation. Cardiology have discussed with the patient in the hospital, she was not interested in procedure such as right heart cath either. She was placed on losartan and 25 mg twice a day metoprolol. She was discharged to [facility] on 02/03/2016 with dry weight 116 pounds, down from 123 pounds on admission. She was discharged on 40 mg twice a day Lasix. She was released from SNF on 8/15.   Since discharge, she has been slowly gaining weight and has increasing edema again. She is using 2 L oxygen 24 7. She did have some nosebleed 2 days ago, it has since stopped. I think it is likely  related to the dry air from nasal cannula. She says she has been followed by her PCPs office for her INR check. She does have a home health nurse coming by next Monday to check her PT/INR. I also given her a paper prescription order for PT INR checked next Monday in case her home health nurse is unable to obtain the lab. She wished to continue to follow-up with her PCP regarding Coumadin level. Her lower extremity does appears to be weeping with significant 3+ pitting edema bilaterally. Her longest relatively clear with only minimal decreased breath sound in the right base. I think she is currently in acute right heart failure. Family has been giving her increased protein shake to help with nutrition. I will increase her Lasix to 80 mg twice a day along with increased dose of potassium supplement. I'll have her come back in a week for follow-up. The only reason I did not send her to the hospital today is because I have checked her O2 saturation again, it was 99% on oxygen. I have asked her daughter to call cardiology if her systolic blood pressure drop below 90 again, if it does I will stop the losartan and continue PO diuresis as long as she can tolerates it.     Past Medical History:  Diagnosis Date  . CHF (congestive heart failure) (HCC)   . Chronic atrial fibrillation (HCC)    controlled on metoprolol and anticoagulated with Coumadin(Dr Ramachandran) .  Marland Kitchen H/O echocardiogram 09/20/2009   EF greater than  55%, no wall motion, bilateral atrial dilation.  Mild aortic sclerosis with no stenosis  . History of nuclear stress test 09/20/2009   Myoview : meduim sized, moderate intensity perfusion defect in the basal inferior wall with moderate reversibility thought to be High Risk scan; deferred invasive evaluation based on patient wishes.  . Hyperlipemia   . Hypertension   . Renal insufficiency 02/09/2016    Past Surgical History:  Procedure Laterality Date  . TONSILLECTOMY      Current  Medications: Outpatient Medications Prior to Visit  Medication Sig Dispense Refill  . Acetaminophen (TYLENOL EXTRA STRENGTH PO) Take 500 mg by mouth daily as needed (PAIN).     . Calcium Carb-Cholecalciferol (CALCIUM 600 + D PO) Take 1 tablet by mouth 2 (two) times daily.    Marland Kitchen. loratadine (CLARITIN) 10 MG tablet Take 10 mg by mouth daily.    Marland Kitchen. losartan (COZAAR) 25 MG tablet Take 1 tablet (25 mg total) by mouth daily. 30 tablet 12  . Magnesium 250 MG TABS Take 1 tablet by mouth daily.    . metoprolol tartrate (LOPRESSOR) 25 MG tablet Take 1 tablet (25 mg total) by mouth 2 (two) times daily. 60 tablet 12  . NITROSTAT 0.4 MG SL tablet TAKE 1 TABLET UNDER TONGUE EVERY 5 MINUTES AS NEEDED FOR CHEST PAIN 25 tablet 1  . Omega-3 Fatty Acids (FISH OIL) 1000 MG CAPS Take 1 capsule by mouth 2 (two) times daily.    . OXYGEN Inhale 2 L into the lungs continuous. DX: Hypoxia    . UNABLE TO FIND Med Name: Med Pass 120 mL by mouth three times daily for weight loss/supplement    . warfarin (COUMADIN) 2 MG tablet Take 2 mg by mouth every morning.    . furosemide (LASIX) 40 MG tablet Take 1 tablet (40 mg total) by mouth 2 (two) times daily. 60 tablet 12  . potassium chloride SA (K-DUR,KLOR-CON) 20 MEQ tablet Take 2 tablets (40 mEq total) by mouth daily. 30 tablet 12   No facility-administered medications prior to visit.      Allergies:   Pravachol [pravastatin sodium] and Latex   Social History   Social History  . Marital status: Married    Spouse name: N/A  . Number of children: N/A  . Years of education: N/A   Social History Main Topics  . Smoking status: Never Smoker  . Smokeless tobacco: Never Used  . Alcohol use No  . Drug use: No  . Sexual activity: Not Asked   Other Topics Concern  . None   Social History Narrative   She widowed ,2 children ,1 grandchild . 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 No tobacco use . No alcohol use. No chewing tobacco      Family History:  The  patient's family history includes Hypertension in her mother; Stomach cancer in her father.   ROS:   Please see the history of present illness.    ROS All other systems reviewed and are negative.   PHYSICAL EXAM:   VS:  BP 100/60   Pulse 86   Ht 5\' 2"  (1.575 m)   Wt 130 lb 12.8 oz (59.3 kg)   SpO2 99% Comment: 2 LITERS  BMI 23.92 kg/m    GEN: Well nourished, well developed, in no acute distress  HEENT: normal  Neck: no JVD, carotid bruits, or masses Cardiac: irregularly irregular; no rubs, or gallops  3+ pitting edema bilaterally.  Respiratory:  clear to auscultation  bilaterally, normal work of breathing +diminished breath sound in R lower base GI: soft, nontender, nondistended, + BS MS: no deformity or atrophy  Skin: warm and dry, no rash Neuro:  Alert and Oriented x 3, Strength and sensation are intact Psych: euthymic mood, full affect  Wt Readings from Last 3 Encounters:  02/17/16 130 lb 12.8 oz (59.3 kg)  02/14/16 124 lb (56.2 kg)  02/09/16 120 lb 6.4 oz (54.6 kg)      Studies/Labs Reviewed:   EKG:  EKG is not ordered today.    Recent Labs: 02/03/2016: B Natriuretic Peptide 587.8 02/08/2016: ALT 25; BUN 53; Creatinine 0.9; Hemoglobin 9.4; Platelets 211; Potassium 4.3; Sodium 137    Additional studies/ records that were reviewed today include:   Echo 01/27/2016 LV EF: 60% -   65%  ------------------------------------------------------------------- Indications:      CHF - 428.0.  ------------------------------------------------------------------- History:   PMH:   Atrial fibrillation.  Congestive heart failure. Risk factors:  Hypertension.  ------------------------------------------------------------------- Study Conclusions  - Left ventricle: The cavity size was normal. Wall thickness was   normal. Systolic function was normal. The estimated ejection   fraction was in the range of 60% to 65%. Wall motion was normal;   there were no regional wall motion  abnormalities. The study is   not technically sufficient to allow evaluation of LV diastolic   function. - Ventricular septum: The contour showed diastolic flattening and   systolic flattening. - Aortic valve: Mildly calcified annulus. Trileaflet; mildly   calcified leaflets. - Mitral valve: Calcified annulus. There was mild regurgitation. - Left atrium: The atrium was moderately dilated. - Right ventricle: The cavity size was moderately to severely   dilated. Systolic function was mildly to moderately reduced. - Right atrium: The atrium was severely dilated. Central venous   pressure (est): 15 mm Hg. - Atrial septum: No defect or patent foramen ovale was identified. - Tricuspid valve: There was moderate regurgitation. - Pulmonary arteries: Systolic pressure was severely increased. PA   peak pressure: 70 mm Hg (S). - Pericardium, extracardiac: A trivial pericardial effusion was   identified.  Impressions:  - Normal LV wall thickness with LVEF 60-65%. Indeterminate   diastolic function in the setting of atrial fibrillation. LV   septal flattening noted consistent with RV pressure and volume   overload. Moderate left atrial enlargement. MAC with mild mitral   regurgitation. Mildly sclerotic aortic valve. Moderate to severe   RV enlargement with moderately decreased contraction. Severe   right atrial enlargement with elevated estimated CVP. At least   moderate tricuspid regurgitation noted with evidence of severe   pulmonary hypertension and PASP estimated 70 mmHg. Trivial   pericardial effusion.    ASSESSMENT:    1. Acute right heart failure (HCC)   2. Chronic atrial fibrillation (HCC)   3. Essential hypertension   4. Hyperlipidemia   5. Malnutrition (HCC)   6. Anemia, unspecified anemia type      PLAN:  In order of problems listed above:  1. Acute right heart failure: Recent echocardiogram obtained during last admission showed normal EF. Her leg is weeping today. Her  weight has increased from baseline of 116 pounds up to 130 lbs. the only reason I did not send her to the hospital today is because her O2 saturations stable at 99% on repeat check. I have increased her by mouth Lasix to 80 mg twice a day along with increased dose of potassium supplement. She did have 2 blisters popped on the left  lower extremity and had a little draining of clear fluid, I have examined the area, it does not appear to be infected. I have asked the nursing staff to apply another dressing over the area. Hopefully once she is fully diuresed, her leg condition will resolve as well. I have also asked her to raise her leg. We did discuss right heart cath again today, she was not interested at this time. If her condition does not improve in 1 week, we may have to admit her again or try metolazone. Check her BMET today and in 1 week.  2. Chronic afib on coumadin: discussed with patient and her daughter, PT/INR managed by PCP, she supposedly will have home health come by next Monday. I have written her a paper prescription to take to her PCP to obtain PT/INR if home health for any recent is unable to obtained Monday.  3. HTN: started on metoprolol 25mg  BID and losartan 25mg  daily  4. HLD: intolerance to pravachol. No prior lipid panel to compare. May consider recheck once she is over current acute issue.   5. Anemia: will repeat CBC given recent nose bleed and coumadin  6.  Malnutrition: low albumin contributed to third spacing of fluid. Family has been giving her protein shakes.     Medication Adjustments/Labs and Tests Ordered: Current medicines are reviewed at length with the patient today.  Concerns regarding medicines are outlined above.  Medication changes, Labs and Tests ordered today are listed in the Patient Instructions below. Patient Instructions  Labwork  Your physician recommends that you return for lab work: BMET and CBC today  Return in 1 week to repeat  BMET.   Medications  Your physician has recommended you make the following change in your medication:   Increase Furosemide to 80 mg twice daily.  Increase Potassium to 40 mg daily.   Follow-Up  Your physician recommends that you schedule a follow-up appointment in: 1 week with an extender.  If you need a refill on your cardiac medications before your next appointment, please call your pharmacy.       Ramond DialSigned, Levi Crass, [place]  02/17/2016 4:26 PM    [facility] [address], [place], [place]  [address] Phone: [phone]; Fax: [phone]

## 2016-02-17 NOTE — Patient Instructions (Signed)
Labwork  Your physician recommends that you return for lab work: BMET and CBC today  Return in 1 week to repeat BMET.   Medications  Your physician has recommended you make the following change in your medication:   Increase Furosemide to 80 mg twice daily.  Increase Potassium to 40 mg daily.   Follow-Up  Your physician recommends that you schedule a follow-up appointment in: 1 week with an extender.  If you need a refill on your cardiac medications before your next appointment, please call your pharmacy.

## 2016-02-17 NOTE — Telephone Encounter (Signed)
Returning a Call . Please call

## 2016-02-17 NOTE — Progress Notes (Signed)
Pt here for visit with Azalee CourseHao Meng, PA.   [facility].   She is scheduled to have home health come check her INR on Monday.   Pt daughter here with her today and will call if they need management or additional help from our coumadin clinic.

## 2016-02-22 ENCOUNTER — Telehealth: Payer: Self-pay | Admitting: Cardiology

## 2016-02-22 NOTE — Telephone Encounter (Signed)
Spoke with Diamond Thomas, the patient was recently discharged form a rehab facility and they are calling to get orders for outpt PT for gait instability, fall risk and heart failure program. Will forward to dr harding for the okay to give orders for PT.

## 2016-02-23 NOTE — Telephone Encounter (Signed)
Forwarded to dr harding for okay to order

## 2016-02-23 NOTE — Telephone Encounter (Signed)
Follow up  Rosanne AshingJim is the pt Physical Therapist at [facility] and he wants an order to keep assisting PT with care   He needs a verbal order first, and he wants a prescription for a 3 in 1 potty chair to [facility]

## 2016-02-24 ENCOUNTER — Other Ambulatory Visit: Payer: Self-pay | Admitting: Physician Assistant

## 2016-02-24 ENCOUNTER — Encounter: Payer: Self-pay | Admitting: Physician Assistant

## 2016-02-24 ENCOUNTER — Ambulatory Visit (INDEPENDENT_AMBULATORY_CARE_PROVIDER_SITE_OTHER): Payer: Medicare Other | Admitting: Physician Assistant

## 2016-02-24 VITALS — BP 97/57 | HR 89 | Ht 62.5 in | Wt 129.6 lb

## 2016-02-24 DIAGNOSIS — I482 Chronic atrial fibrillation, unspecified: Secondary | ICD-10-CM

## 2016-02-24 DIAGNOSIS — E785 Hyperlipidemia, unspecified: Secondary | ICD-10-CM

## 2016-02-24 DIAGNOSIS — I50811 Acute right heart failure: Secondary | ICD-10-CM

## 2016-02-24 DIAGNOSIS — I509 Heart failure, unspecified: Secondary | ICD-10-CM | POA: Diagnosis not present

## 2016-02-24 DIAGNOSIS — D649 Anemia, unspecified: Secondary | ICD-10-CM

## 2016-02-24 DIAGNOSIS — Z7901 Long term (current) use of anticoagulants: Secondary | ICD-10-CM

## 2016-02-24 DIAGNOSIS — E43 Unspecified severe protein-calorie malnutrition: Secondary | ICD-10-CM

## 2016-02-24 DIAGNOSIS — E8881 Metabolic syndrome: Secondary | ICD-10-CM

## 2016-02-24 DIAGNOSIS — I10 Essential (primary) hypertension: Secondary | ICD-10-CM | POA: Diagnosis not present

## 2016-02-24 LAB — TSH: TSH: 0.01 mIU/L — ABNORMAL LOW

## 2016-02-24 LAB — COMPREHENSIVE METABOLIC PANEL
ALBUMIN: 3.5 g/dL — AB (ref 3.6–5.1)
ALK PHOS: 179 U/L — AB (ref 33–130)
ALT: 28 U/L (ref 6–29)
AST: 35 U/L (ref 10–35)
BILIRUBIN TOTAL: 1 mg/dL (ref 0.2–1.2)
BUN: 48 mg/dL — ABNORMAL HIGH (ref 7–25)
CO2: 20 mmol/L (ref 20–31)
CREATININE: 0.81 mg/dL (ref 0.60–0.88)
Calcium: 9.7 mg/dL (ref 8.6–10.4)
Chloride: 105 mmol/L (ref 98–110)
Glucose, Bld: 95 mg/dL (ref 65–99)
Potassium: 5.1 mmol/L (ref 3.5–5.3)
SODIUM: 137 mmol/L (ref 135–146)
TOTAL PROTEIN: 7.4 g/dL (ref 6.1–8.1)

## 2016-02-24 NOTE — Telephone Encounter (Signed)
OK for PT care to continue.  Bryan Lemmaavid Harding, MD

## 2016-02-24 NOTE — Patient Instructions (Signed)
Your physician recommends that you schedule a follow-up appointment on 03/02/16 with Dr Herbie BaltimoreHarding.   Your physician recommends that you return for lab work. And repeat urine at the request of your primary care physician.  Your physician has recommended you make the following change in your medication: STOP the losartan.

## 2016-02-24 NOTE — Progress Notes (Signed)
Cardiology Office Note    Date:  02/24/2016   ID:  [name], DOB [birth year], MRN [id number]  PCP:  Diamond Fick, MD  Cardiologist:  Dr. Herbie Thomas  Chief Complaint  Patient presents with  . Follow-up    seen for Dr. Herbie Thomas    History of Present Illness:  Diamond Thomas is a 82 y.o. Thomas with PMH of chronic afib on coumadin, chronic diastolic HF, HTN and HLD. When she was first diagnosed with atrial fibrillation back in 2011, she had moderate to high risk abnormal Myoview stress test showing medium-sized moderate severity perfusion defect in the basal inferior and inferior wall with moderate reversibility. She did not wish to undergo coronary angiography at the time. Echocardiogram obtained in 2011 showed normal EF. In the recent month, she has been having increased pitting edema. She was seen in the clinic on 01/26/2016 at which time she appears to have worsening sign or symptom of acute heart failure.   She was admitted to the hospital from 7/27-8/4 for IV diuresis. The edema was felt to be right heart failure with protein malnutrition with albumin 2.9. Echocardiogram was repeated on 01/27/2016 which showed EF 60-65%, moderately to severely dilated right ventricle with RVEF mildly to moderately reduced, PA peak pressure 70 mmHg, mitral annular calcification with mild mitral regurgitation. Cardiology have discussed with the patient in the hospital, she was not interested in procedure such as right heart cath either. She was placed on losartan and 25 mg twice a day metoprolol. She was [facility] to [facility] on 02/03/2016 with dry weight 116 pounds, down from 123 pounds on admission. She was discharged on 40 mg twice a day Lasix. She was released from SNF on 8/15.   I last saw her in the office a week ago, at which time she was slowly gaining weight and has increased edema on physical exam. She did have some nosebleed prior to the event, however this has not dropped her  hemoglobin A1c. Nosebleed likely occurred secondary to the dry air from nasal cannula. Her PCP and home health nurse has been monitoring her INR. I felt the patient was in acute right heart failure, Lasix was increased to 80 mg twice a day with increased dose of potassium supplement. She presents today for cardiology follow-up. Her blood pressure was borderline last time, today it continued to be borderline, in fact, her daughter has been holding her beta blocker and her Lasix for the past 3 days. She has been very fatigued. On physical exam, her leg does not appear to be much different from before. Her O2 saturation is still stable around 94% on oxygen. Her daughter brought her blood pressure and weight diary. It appears her weight has decreased 5 pounds since I saw her based on her home scale from 130 pounds down to 125 pound. Hopefully stopping the losartan will increase her pressure and allow better diuresis. Unfortunately given the combination of right heart failure and low albumin, I fear she will always have some degree of lower extremity edema and her condition will continue to deteriorate. For now I have encouraged her to continue on the 80 mg twice a day of Lasix. She will need close outpatient observation. She will follow-up with Dr. Herbie Thomas next week. We will follow-up on the result of CMP to see her renal function. With right heart failure, I am concerned that over diuresis will decrease preload. Since patient does not appears to wish for aggressive advanced heart failure therapy,  I did not refer her to our heart failure clinic. I think she will be appropriate candidate to obtain outpatient palliative care consultation.  Her PCP has requested us to obtain some of her annual labs including CMP, PT/INR, TSH, microalbumin and creatinine ratio, UA, hgb A1C. she is not fasting today, therefore fasting lipid panel will need to be obtained at another time. Although her PCP requested CBC as well, she just had  a CBC last week, I do not think it needs to be repeated. Her hemoglobin on 02/17/2016 is 10.3 up from 9.4 on 8/9. White blood cell count 6.2. Hematocrit 32.2. Platelet 282.     Past Medical History:  Diagnosis Date  . CHF (congestive heart failure) (HCC)   . Chronic atrial fibrillation (HCC)    controlled on metoprolol and anticoagulated with Coumadin(Dr Diamond Thomas) .  Marland Kitchen. H/O echocardiogram 09/20/2009   EF greater than 55%, no wall motion, bilateral atrial dilation.  Mild aortic sclerosis with no stenosis  . History of nuclear stress test 09/20/2009   Myoview : meduim sized, moderate intensity perfusion defect in the basal inferior wall with moderate reversibility thought to be High Risk scan; deferred invasive evaluation based on patient wishes.  . Hyperlipemia   . Hypertension   . Renal insufficiency 02/09/2016    Past Surgical History:  Procedure Laterality Date  . TONSILLECTOMY      Current Medications: Outpatient Medications Prior to Visit  Medication Sig Dispense Refill  . Acetaminophen (TYLENOL EXTRA STRENGTH PO) Take 500 mg by mouth daily as needed (PAIN).     . Calcium Carb-Cholecalciferol (CALCIUM 600 + D PO) Take 1 tablet by mouth 2 (two) times daily.    . furosemide (LASIX) 80 MG tablet Take 1 tablet (80 mg total) by mouth 2 (two) times daily. 30 tablet 6  . loratadine (CLARITIN) 10 MG tablet Take 10 mg by mouth daily.    . Magnesium 250 MG TABS Take 1 tablet by mouth daily.    . metoprolol tartrate (LOPRESSOR) 25 MG tablet Take 1 tablet (25 mg total) by mouth 2 (two) times daily. 60 tablet 12  . NITROSTAT 0.4 MG SL tablet TAKE 1 TABLET UNDER TONGUE EVERY 5 MINUTES AS NEEDED FOR CHEST PAIN 25 tablet 1  . Omega-3 Fatty Acids (FISH OIL) 1000 MG CAPS Take 1 capsule by mouth 2 (two) times daily.    . OXYGEN Inhale 2 L into the lungs continuous. DX: Hypoxia    . UNABLE TO FIND Med Name: Med Pass 120 mL by mouth three times daily for weight loss/supplement    . warfarin  (COUMADIN) 2 MG tablet Take 2 mg by mouth every morning.    Marland Kitchen. losartan (COZAAR) 25 MG tablet Take 1 tablet (25 mg total) by mouth daily. 30 tablet 12  . bisacodyl (DULCOLAX) 10 MG suppository Place 10 mg rectally as needed for moderate constipation.    . potassium chloride SA (K-DUR,KLOR-CON) 20 MEQ tablet Take 2 tablets (40 mEq total) by mouth 2 (two) times daily. (Patient not taking: Reported on 02/24/2016) 60 tablet 6   No facility-administered medications prior to visit.      Allergies:   Pravachol [pravastatin sodium] and Latex   Social History   Social History  . Marital status: Married    Spouse name: N/A  . Number of children: N/A  . Years of education: N/A   Social History Main Topics  . Smoking status: Never Smoker  . Smokeless tobacco: Never Used  . Alcohol use  No  . Drug use: No  . Sexual activity: Not Asked   Other Topics Concern  . None   Social History Narrative   She widowed ,2 children ,1 grandchild . 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 No tobacco use . No alcohol use. No chewing tobacco      Family History:  The patient's family history includes Hypertension in her mother; Stomach cancer in her father.   ROS:   Please see the history of present illness.    ROS All other systems reviewed and are negative.   PHYSICAL EXAM:   VS:  BP (!) 97/57   Pulse 89   Ht 5' 2.5" (1.588 m)   Wt 129 lb 9.6 oz (58.8 kg)   BMI 23.33 kg/m    GEN: Well nourished, well developed, in no acute distress  HEENT: normal  Neck: no JVD, carotid bruits, or masses Cardiac: irregular; no murmurs, rubs, or gallops. 3+ pitting edema bilaterally. Respiratory:  clear to auscultation bilaterally, normal work of breathing GI: soft, nontender, nondistended, + BS MS: no deformity or atrophy  Skin: warm and dry, no rash Neuro:  Alert and Oriented x 3, Strength and sensation are intact Psych: euthymic mood, full affect  Wt Readings from Last 3 Encounters:  02/24/16  129 lb 9.6 oz (58.8 kg)  02/17/16 130 lb 12.8 oz (59.3 kg)  02/14/16 124 lb (56.2 kg)      Studies/Labs Reviewed:   EKG:  EKG is not ordered today.    Recent Labs: 02/03/2016: B Natriuretic Peptide 587.8 02/08/2016: ALT 25 02/17/2016: BUN 67; Creat 1.14; Hemoglobin 10.3; Platelets 282; Potassium 5.2; Sodium 138   Lipid Panel No results found for: CHOL, TRIG, HDL, CHOLHDL, VLDL, LDLCALC, LDLDIRECT  Additional studies/ records that were reviewed today include:   Echo 01/27/2016 LV EF: 60% - 65%  ------------------------------------------------------------------- Indications: CHF - 428.0.  ------------------------------------------------------------------- History: PMH: Atrial fibrillation. Congestive heart failure. Risk factors: Hypertension.  ------------------------------------------------------------------- Study Conclusions  - Left ventricle: The cavity size was normal. Wall thickness was normal. Systolic function was normal. The estimated ejection fraction was in the range of 60% to 65%. Wall motion was normal; there were no regional wall motion abnormalities. The study is not technically sufficient to allow evaluation of LV diastolic function. - Ventricular septum: The contour showed diastolic flattening and systolic flattening. - Aortic valve: Mildly calcified annulus. Trileaflet; mildly calcified leaflets. - Mitral valve: Calcified annulus. There was mild regurgitation. - Left atrium: The atrium was moderately dilated. - Right ventricle: The cavity size was moderately to severely dilated. Systolic function was mildly to moderately reduced. - Right atrium: The atrium was severely dilated. Central venous pressure (est): 15 mm Hg. - Atrial septum: No defect or patent foramen ovale was identified. - Tricuspid valve: There was moderate regurgitation. - Pulmonary arteries: Systolic pressure was severely increased. PA peak pressure: 70  mm Hg (S). - Pericardium, extracardiac: A trivial pericardial effusion was identified.  Impressions:  - Normal LV wall thickness with LVEF 60-65%. Indeterminate diastolic function in the setting of atrial fibrillation. LV septal flattening noted consistent with RV pressure and volume overload. Moderate left atrial enlargement. MAC with mild mitral regurgitation. Mildly sclerotic aortic valve. Moderate to severe RV enlargement with moderately decreased contraction. Severe right atrial enlargement with elevated estimated CVP. At least moderate tricuspid regurgitation noted with evidence of severe pulmonary hypertension and PASP estimated 70 mmHg. Trivial pericardial effusion.   ASSESSMENT:    1. Acute right heart failure (HCC)  2. Chronic atrial fibrillation (HCC)   3. Essential hypertension   4. Hyperlipidemia   5. Severe protein-calorie malnutrition (HCC)   6. Anemia, unspecified anemia type   7. Metabolic syndrome   8. Long term (current) use of anticoagulants      PLAN:  In order of problems listed above:  1. Acute right heart failure: Recent echocardiogram obtained during last admission showed normal EF. She remains fluid overloaded, recent diuresis, however has decreased her weight from 130 pounds down to 125 pounds. Her dry weight supposed to be around 116 pounds. I have advised her to continue on the Lasix. Her PCP recommended her to have complete metabolic panel and Microalbumin/Cr ratio, I'm also interested in her albumin and Cr level. I am concerned that overdiuresis may cause significant decreasing preload, therefore we will need to monitor creatinine closely. I think her condition will likely continue to deteriorate. She may be candidate for outpatient palliative care consult to discuss treatment goals.  2. Chronic afib on coumadin: discussed with patient and her daughter, PT/INR managed by PCP.   3. HTN: started on metoprolol 25mg  BID and  losartan 25mg  daily on recent admission. Her BP has been borderline, in fact her daughter has been holding her metoprolol and Lasix for the last 3 days. I have encouraged her to restart on the metoprolol, however I will stop the losartan. She will continue to monitor her blood pressure and her weight.  4. HLD: intolerance to pravachol. No prior lipid panel to compare. May consider recheck once she is over current acute issue. Her PCP has asked her to obtain fasting lipid on follow-up, however she already ate today. This level will has to be drawn on different day.  5. Anemia: hgb stable on last check  6. Malnutrition: low albumin contributed to third spacing of fluid. Family has been giving her protein shakes.     Medication Adjustments/Labs and Tests Ordered: Current medicines are reviewed at length with the patient today.  Concerns regarding medicines are outlined above.  Medication changes, Labs and Tests ordered today are listed in the Patient Instructions below. Patient Instructions  Your physician recommends that you schedule a follow-up appointment on 03/02/16 with Dr Diamond Thomas.   Your physician recommends that you return for lab work. And repeat urine at the request of your primary care physician.  Your physician has recommended you make the following change in your medication: STOP the losartan.       Ramond Dial, [place]  02/24/2016 9:38 PM    [facility] [address] [place], [place], [place]  [address] Phone: [phone]; Fax: [phone]

## 2016-02-24 NOTE — Telephone Encounter (Signed)
Received call from [facility] LongsJoseph [facility],  Advised per MD Herbie BaltimoreHarding ok to continue PT at this time. (see telephone note) Verbalized understanding.  No further questions or concerns at this time.

## 2016-02-25 LAB — MICROALBUMIN / CREATININE URINE RATIO

## 2016-02-25 LAB — HEMOGLOBIN A1C
HEMOGLOBIN A1C: 5.8 % — AB (ref <5.7)
MEAN PLASMA GLUCOSE: 120 mg/dL

## 2016-02-25 LAB — BRAIN NATRIURETIC PEPTIDE: Brain Natriuretic Peptide: 581.9 pg/mL — ABNORMAL HIGH (ref <100)

## 2016-02-25 LAB — PROTIME-INR
INR: 2.9 — ABNORMAL HIGH
PROTHROMBIN TIME: 29.3 s — AB (ref 9.0–11.5)

## 2016-02-27 LAB — T4, FREE: FREE T4: 2.6 ng/dL — AB (ref 0.8–1.8)

## 2016-02-27 NOTE — Addendum Note (Signed)
Addended by: Burnetta SabinWITTY, Eastyn Dattilo K on: 02/27/2016 09:14 AM   Modules accepted: Orders

## 2016-03-01 ENCOUNTER — Telehealth: Payer: Self-pay | Admitting: Cardiology

## 2016-03-01 NOTE — Telephone Encounter (Signed)
Wynema BirchHao saw this patient on 8/25 and advised f/u w Dr. Herbie BaltimoreHarding on 9/1. No appt was set up that day, pt's daughter called back today to confirm/make appt.  I advised Yolande JollyKedra OK to add to "end" open slot at 10:30am tomorrow as this follow up was previously recommended by by APP.  [facility]

## 2016-03-01 NOTE — Telephone Encounter (Signed)
Follow up   Pt daughter verbalized that she was told that pt had appt 03/02/2016,, reviewed Chart per [id number]:Patient Instructions Your physician recommends that you schedule a follow-up appointment on 03/02/16 with Dr Herbie BaltimoreHarding. Per Rn its okay to put pt on 03/02/2016  Pt daughter confirmed that the  time, date & location of appt is good

## 2016-03-02 ENCOUNTER — Ambulatory Visit (INDEPENDENT_AMBULATORY_CARE_PROVIDER_SITE_OTHER): Payer: Medicare Other | Admitting: Cardiology

## 2016-03-02 ENCOUNTER — Encounter: Payer: Self-pay | Admitting: Cardiology

## 2016-03-02 ENCOUNTER — Other Ambulatory Visit: Payer: Self-pay | Admitting: Physician Assistant

## 2016-03-02 VITALS — BP 102/60 | HR 90 | Ht 63.0 in | Wt 131.8 lb

## 2016-03-02 DIAGNOSIS — R601 Generalized edema: Secondary | ICD-10-CM | POA: Diagnosis not present

## 2016-03-02 DIAGNOSIS — I10 Essential (primary) hypertension: Secondary | ICD-10-CM | POA: Diagnosis not present

## 2016-03-02 DIAGNOSIS — Z7901 Long term (current) use of anticoagulants: Secondary | ICD-10-CM

## 2016-03-02 DIAGNOSIS — I482 Chronic atrial fibrillation, unspecified: Secondary | ICD-10-CM

## 2016-03-02 DIAGNOSIS — R9439 Abnormal result of other cardiovascular function study: Secondary | ICD-10-CM

## 2016-03-02 DIAGNOSIS — R931 Abnormal findings on diagnostic imaging of heart and coronary circulation: Secondary | ICD-10-CM

## 2016-03-02 DIAGNOSIS — I2721 Secondary pulmonary arterial hypertension: Secondary | ICD-10-CM

## 2016-03-02 DIAGNOSIS — Z515 Encounter for palliative care: Secondary | ICD-10-CM | POA: Diagnosis not present

## 2016-03-02 DIAGNOSIS — R600 Localized edema: Secondary | ICD-10-CM

## 2016-03-02 DIAGNOSIS — I272 Other secondary pulmonary hypertension: Secondary | ICD-10-CM

## 2016-03-02 DIAGNOSIS — Z79899 Other long term (current) drug therapy: Secondary | ICD-10-CM | POA: Insufficient documentation

## 2016-03-02 MED ORDER — METOLAZONE 2.5 MG PO TABS: 2.5000 mg | ORAL_TABLET | Freq: Every day | ORAL | 3 refills | Status: DC

## 2016-03-02 MED ORDER — DIGOXIN 125 MCG PO TABS: 0.1250 mg | ORAL_TABLET | Freq: Every day | ORAL | 3 refills | Status: AC

## 2016-03-02 NOTE — Progress Notes (Signed)
PCP: Georgianne Fick, MD  Clinic Note: Chief Complaint  Patient presents with  . Follow-up    heart failure, afib  . Leg Swelling    Related to pulmonary hypertension. Palliative care conversation    HPI: Diamond Thomas is a 82 y.o. female with a PMH below who presents today for third postoperative follow-up after being admitted for acute heart failure exacerbation. She has a history of long-standing atrial fibrillation dating back to 2011. She had a High Risk Myoview Stress Test showing medium sized moderate intensity perfusion defect in the basal inferior and inferior wall with moderate reversibility. She at that time Noted that she would not want to undergo coronary angiography. Her EF has been normal by echocardiogram and Myoview  Diamond Thomas was last seen by me on 01/26/2016 and recommended hospital with anasarca.  She was referred for worsening edema and 20 pound gain since her January visit. No response to increased dose oral Lasix. Noted to have protein malnutrition.  Recent Hospitalizations:  07/27 - 02/03/2016: Admitted for IV diuresis. Weight reduced from 123 08/02/2014 pounds after 6 days. Cr on discharge 1.24. Transition to oral Lasix 40 mg twice a day. Thought to be secondary pulmonary hypertension due to diastolic dysfunction with A. Fib.  Started on low-dose ARB but unable to titrate up. Remained in rate controlled A. fib on 25 mg twice a day Lopressor. INR stable on warfarin. She was admitted to [facility] under the care of Dr. Alwyn Ren.  Released from [facility] 8/15. She was monitored by nutrition services/dietitians while in the hospital. She was given Ensure supplements with the intention to drink 3 times a day. Findings are severe fat depletion, severe muscle depletion, and moderate edema.   Studies Reviewed:   2-D Echocardiogram: EF 60-65% with no RWMA. Unable to determine diastolic function due to A. fib. Diastolic and systolic flattening of the septum  noted. Moderate to severely dilated RV with mild-moderately reduced dysfunction. Severe RA dilation with CVP estimated at 15 mmHg. Severe Pulmonary Hypertension with PA pressures estimated 70 mmHg.  Seen in outpatient follow-up by Azalee Course, PA-C on 02/17/2016 -- noted to be slowly gaining weight with increasing edema. On 24-72 L oxygen. Home health RN was helping evaluations and INR checks. Noted to have 3+ pitting edema bilaterally with lower extremity weeping. Lasix increased to 80 mg twice a day with increase potassium supplementation. Oxygen saturation was 99%.  Still not imaged and right heart catheterization. Limb was to return for reevaluation and possibly adding metolazone.  Follow-up visit on 02/24/2016: Beta blocker and Lasix have been held for 3 days due to hypotension. She remained edematous, but weight was down 5 pounds on her home scale (130 pounds to 125 pounds).  ARB was stopped to allow for pressure for diuresis. She indicated that she would not wish aggressive VATS heart failure therapy, therefore was not referred to heart failure clinic. Consider outpatient palliative care consultation. Wt Readings from Last 3 Encounters:  03/02/16 131 lb 12.8 oz (59.8 kg)  02/24/16 129 lb 9.6 oz (58.8 kg)  02/17/16 130 lb 12.8 oz (59.3 kg)    Interval History: Diamond Thomas presents today from for follow-up. Her weights of a relatively stable if not somewhat slowly decreasing at home. She still has significant edema. She really denies any chest tightness or pressure. She still sleeps in a recliner, I'm not sure if this is related to orthopnea or that she is too weak to get to the bed. She really  denies any PND symptoms. She will be short of breath walking around the house, but this is not a real change for her.   No palpitations, lightheadedness, dizziness, weakness or syncope/near syncope. No TIA/amaurosis fugax symptoms. No melena, hematochezia, hematuria, or epstaxis. No claudication.  ROS: A  comprehensive was performed. Review of Systems  Constitutional: Positive for malaise/fatigue.  HENT: Negative for congestion.   Respiratory: Positive for shortness of breath.   Cardiovascular: Positive for leg swelling.  Gastrointestinal: Negative for blood in stool.  Genitourinary: Negative for hematuria.  Musculoskeletal: Negative for falls.       Her legs hurt, may be related to her edema.  Neurological: Positive for dizziness (Positional) and weakness (Generalized). Negative for loss of consciousness.  Psychiatric/Behavioral:       She actually seems mentally intact  All other systems reviewed and are negative.    Past Medical History:  Diagnosis Date  . CHF (congestive heart failure) (HCC)   . Chronic atrial fibrillation (HCC)    controlled on metoprolol and anticoagulated with Coumadin(Dr Ramachandran) .  Marland Kitchen. H/O echocardiogram 09/20/2009   EF greater than 55%, no wall motion, bilateral atrial dilation.  Mild aortic sclerosis with no stenosis  . History of nuclear stress test 09/20/2009   Myoview : meduim sized, moderate intensity perfusion defect in the basal inferior wall with moderate reversibility thought to be High Risk scan; deferred invasive evaluation based on patient wishes.  . Hyperlipemia   . Hypertension   . Renal insufficiency 02/09/2016    Past Surgical History:  Procedure Laterality Date  . TONSILLECTOMY      Prior to Admission medications   Medication Sig Start Date End Date Taking? Authorizing Provider  Acetaminophen (TYLENOL EXTRA STRENGTH PO) Take 500 mg by mouth daily as needed (PAIN).    Yes Historical Provider, MD  Calcium Carb-Cholecalciferol (CALCIUM 600 + D PO) Take 1 tablet by mouth 2 (two) times daily.   Yes Historical Provider, MD  furosemide (LASIX) 80 MG tablet Take 1 tablet (80 mg total) by mouth 2 (two) times daily. 02/17/16  Yes Azalee CourseHao Meng, PA  loratadine (CLARITIN) 10 MG tablet Take 10 mg by mouth daily.   Yes Historical Provider, MD    Magnesium 250 MG TABS Take 1 tablet by mouth daily.   Yes Historical Provider, MD  metoprolol tartrate (LOPRESSOR) 25 MG tablet Take 1 tablet (25 mg total) by mouth 2 (two) times daily. 02/03/16  Yes Little IshikawaErin E Smith, NP  NITROSTAT 0.4 MG SL tablet TAKE 1 TABLET UNDER TONGUE EVERY 5 MINUTES AS NEEDED FOR CHEST PAIN 10/12/14  Yes Lennette Biharihomas A Kelly, MD  Diamond Thomas-3 Fatty Acids (FISH OIL) 1000 MG CAPS Take 1 capsule by mouth 2 (two) times daily.   Yes Historical Provider, MD  OXYGEN Inhale 2 L into the lungs continuous. DX: Hypoxia   Yes Historical Provider, MD  potassium chloride SA (K-DUR,KLOR-CON) 20 MEQ tablet Take 40 mEq by mouth 2 (two) times daily.   Yes Historical Provider, MD  warfarin (COUMADIN) 2 MG tablet Take 2 mg by mouth every morning.   Yes Historical Provider, MD     Allergies  Allergen Reactions  . Pravachol [Pravastatin Sodium] Other (See Comments)    Myalgias  . Latex Itching    Social History   Social History  . Marital status: Married    Spouse name: N/A  . Number of children: N/A  . Years of education: N/A   Social History Main Topics  . Smoking status: Never Smoker  .  Smokeless tobacco: Never Used  . Alcohol use No  . Drug use: No  . Sexual activity: Not Asked   Other Topics Concern  . None   Social History Narrative   She widowed ,2 children ,1 grandchild . 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 No tobacco use . No alcohol use. No chewing tobacco     Family History  Problem Relation Age of Onset  . Hypertension Mother   . Stomach cancer Father   . Stroke Neg Hx   . Diabetes Neg Hx   . Heart disease Neg Hx   . Heart attack Neg Hx     Wt Readings from Last 3 Encounters:  03/02/16 131 lb 12.8 oz (59.8 kg)  02/24/16 129 lb 9.6 oz (58.8 kg)  02/17/16 130 lb 12.8 oz (59.3 kg)    PHYSICAL EXAM BP 102/60   Pulse 90   Ht 5\' 3"  (1.6 m)   Wt 131 lb 12.8 oz (59.8 kg)   BMI 23.35 kg/m  General appearance: Thin, frail, elderly woman with  persistent edema. She actually is in no acute distress and is alert and oriented. Neck: no adenopathy, no carotid bruit; she has significant pulsatile JVP up to about the ear. Likely related to her elevated pulmonary pressures. Lungs: clear to auscultation bilaterally, normal percussion bilaterally and non-labored Heart: Irregularly irregular rate and rhythm, S1 normal & split S2 , 1/6 low pitch, c-d SEM @ RUSB - also ~2/6 HSM @ RLSB, no, click, rub or gallop  Abdomen: soft, non-tender; bowel sounds normal; no masses,  no organomegaly; + HJR Extremities: extremities normal, atraumatic, no cyanosis, and edema 3+ up to thighs Pulses: 2+ and symmetric; really cannot feel pedal pulses due to edema Skin: Pitting edema. Venous stasis changes. Neurologic: Mental status: Alert, oriented, thought content appropriate    Adult ECG Report n/a   Other studies Reviewed: Additional studies/ records that were reviewed today include:  Recent Labs:  Lab Results  Component Value Date   CREATININE 0.81 02/24/2016   CREATININE 1.14 (H) 02/17/2016   CREATININE 0.9 02/08/2016    Lab Results  Component Value Date   K 5.1 02/24/2016   Lab Results  Component Value Date   INR 2.9 (H) 02/24/2016   INR 2.2 (A) 02/08/2016   INR 2.3 (A) 02/04/2016   PROTIME 22.6 (A) 02/08/2016   PROTIME 24.0 (A) 02/04/2016   Lab Results  Component Value Date   LABPROT 29.3 (H) 02/24/2016   Lab Results  Component Value Date   LABPROT 29.3 (H) 02/24/2016  Albumin 3.5   BNP 582 (likely "burnt out")  ASSESSMENT / PLAN: Problem List Items Addressed This Visit    Pulmonary artery hypertension (HCC)    Noted on her echocardiogram. I have a hard time believing this is simply related to diastolic heart failure, however this point is a moot point, because she is reluctant to have any invasive or even now noninvasive evaluations. Adding Zaroxolyn for additional diuresis during weekdays. Adding digoxin for increased  inotropic effect. Somewhat counterintuitive with diastolic dysfunction, but will help with atrial fibrillation.      Relevant Medications   digoxin (LANOXIN) 0.125 MG tablet   metolazone (ZAROXOLYN) 2.5 MG tablet   Palliative care encounter - Primary    I don't think we have really fully broach this topic with her yet. This is something that was suggestive also is in the hospital. I honestly think that at this point in time she is pretty much  leaning this direction. She is not extubated any invasive evaluations with either right or left heart catheterization. She is not really extended other further studies. Her right heart pressures on echocardiogram were very high with now persistent anasarca despite having admitted her for IV diuresis.  Think this point we do need to start thinking about goals of care. I had a discussion with her and her daughter today about goals of care. We discussed issues of potential living will and DNR/DNI. She seems to still be in the mindset that she will have everything done, I did explain to her that I did not feel like a resuscitation effort would be successfully were she to go into VF RVT. The other concern will be about her wishes for possible intubation if it came to it. At this point she is not really ready to make those decisions. I encouraged her to continue talking with her PCP. We will have her return in a couple months to rediscuss this. At this point we do not have any advanced directive ideas, I think they need to digest the information provided and at a minimum we are not moving forward with any invasive management. Looking toward potentially avoiding return to the hospital. We are adding digoxin for better rate control and potential inotropic help along with Zaroxolyn for better diuresis. We will need to monitor her labs as a result. She continues to have home health, I did broach the topic of potentially thinking about outpatient palliative care management plus  or minus even hospice with her potential assistance plan with care for the patient. This would be a relief for her son and daughter who are taking on the whole brunt of managing her.  I did spent close to an hour with the patient and her daughter today. Greater than 50% of the time was spent in this discussion.      Medication management   Relevant Orders   Comprehensive metabolic panel   Digoxin level   High risk NST 08/2009, pt refused diagnostic heart cath, EF >55% by echo 08/2009 (Chronic)    She had the echo done when I last saw her. Most of the wall motion abnormality related to septal flattening from elevated right heart pressures. Quite likely has some type of cardiac artery disease, but no plans for invasive evaluation, therefore we can only treat medically. She is on beta blocker, at this point I would not add statin or aspirin as she is on warfarin.      Essential hypertension (Chronic)    Not really an issue. On minimal medications. Watching for hypotension. Was not able to tolerate ARB. At this point I think scaling back on any medication is probably a good idea.      Relevant Medications   digoxin (LANOXIN) 0.125 MG tablet   metolazone (ZAROXOLYN) 2.5 MG tablet   Other Relevant Orders   Comprehensive metabolic panel   Protime-INR   Digoxin level   Edema of both legs (Chronic)    As far as I can tell, this is not really any more improved since before I sent her to the hospital. Her weight seemed to have been stable, but there still high. I would like for her to continue diuresis. In order to do so probably need to add Zaroxolyn. The issue is that with right-sided heart failure there is a push pull phenomenon in which we need to allow some time for recuperation. I suspect that her renal function will necessarily worsen intermittently while  she is intravascularly deplete. Very difficult to manage right-sided heart failure.  Plan: Add ZAROXOLYN 2.5 MG  TAKE 30 MIN PRIOR TO  MORNING DOSE OF LASIX - Monday THRU Friday- DO NOT TAKE ON SATURDAYS OR SUNDAYS.   HAVE HOME HEALTH TO DRAW LABS IN 2 WEEKS: CMP,       Chronic atrial fibrillation (HCC):  CHA2DS2-VASc Score 5, On Warfarin (Chronic)    Rate controlled on metoprolol. Anticoagulated with warfarin per PCP. Would like to have a little better baseline rate control. Adding low-dose digoxin. We'll check this level in 2 weeks.       Relevant Medications   digoxin (LANOXIN) 0.125 MG tablet   metolazone (ZAROXOLYN) 2.5 MG tablet   Other Relevant Orders   Comprehensive metabolic panel   Protime-INR   Digoxin level   Chronic anticoagulation - with warfarin (Chronic)   Relevant Orders   Comprehensive metabolic panel   Protime-INR   Digoxin level   Anasarca   Relevant Orders   Comprehensive metabolic panel   Protime-INR   Digoxin level    Other Visit Diagnoses   None.    Currently No Advanced Directives   Current medicines are reviewed at length with the patient today. (+/- concerns) needs to set up INR checks using [facility] RN  The following changes have been made:   START DIGOXIN 0.125 MG ONE TABLET  DAILY  THE FIRST DAY TAKE 2 TABLETS.  ZAROXOLYN 2.5 MG  TAKE 30 MIN PRIOR TO MORNING DOSE OF LASIX - Monday THRU Friday- DO NOT TAKE ON SATURDAYS OR SUNDAYS.   HAVE HOME HEALTH TO DRAW LABS IN 2 WEEKS-- INR (SEND RESULTS TO DR RAMACHANDRAN)--,CMP,DIG LEVEL-- DO NOT GIVE DIGOXIN THAT DAY UNTIL BLOOD WORK IS DRAWN  Your physician recommends that you schedule a follow-up appointment in:2 MONTHS WITH DR Maybelle Depaoli -30 MIN APPOINTMENT. Studies Ordered:   Orders Placed This Encounter  Procedures  . Comprehensive metabolic panel  . Protime-INR  . Digoxin level      Bryan Lemma, M.D., M.S. Interventional Cardiologist   Pager # [phone] Phone # [phone] [address] [place], [place] [address]

## 2016-03-02 NOTE — Patient Instructions (Signed)
START DIGOXIN 0.125 MG ONE TABLET  DAILY  THE FIRST DAY TAKE 2 TABLETS.  ZAROXOLYN 2.5 MG  TAKE 30 MIN PRIOR TO MORNING DOSE OF LASIX - Monday THRU Friday- DO NOT TAKE ON SATURDAYS OR SUNDAYS.   HAVE HOME HEALTH TO DRAW LABS IN 2 WEEKS-- INR (SEND RESULTS TO DR RAMACHANDRAN)--,CMP,DIG LEVEL-- DO NOT GIVE DIGOXIN THAT DAY UNTIL BLOOD WORK IS DRAWN  Your physician recommends that you schedule a follow-up appointment in:2 MONTHS WITH DR HARDING -30 MIN APPOINTMENT.

## 2016-03-03 LAB — MICROALBUMIN / CREATININE URINE RATIO
CREATININE, URINE: 45 mg/dL (ref 20–320)
Microalb Creat Ratio: 127 mcg/mg creat — ABNORMAL HIGH (ref <30)
Microalb, Ur: 5.7 mg/dL

## 2016-03-05 ENCOUNTER — Encounter: Payer: Self-pay | Admitting: Cardiology

## 2016-03-05 DIAGNOSIS — Z515 Encounter for palliative care: Secondary | ICD-10-CM | POA: Insufficient documentation

## 2016-03-05 NOTE — Assessment & Plan Note (Signed)
She had the echo done when I last saw her. Most of the wall motion abnormality related to septal flattening from elevated right heart pressures. Quite likely has some type of cardiac artery disease, but no plans for invasive evaluation, therefore we can only treat medically. She is on beta blocker, at this point I would not add statin or aspirin as she is on warfarin.

## 2016-03-05 NOTE — Assessment & Plan Note (Signed)
Rate controlled on metoprolol. Anticoagulated with warfarin per PCP. Would like to have a little better baseline rate control. Adding low-dose digoxin. We'll check this level in 2 weeks.

## 2016-03-05 NOTE — Assessment & Plan Note (Signed)
Not really an issue. On minimal medications. Watching for hypotension. Was not able to tolerate ARB. At this point I think scaling back on any medication is probably a good idea.

## 2016-03-05 NOTE — Progress Notes (Addendum)
Advanced Care Planning:/Palliative Care discussion Please see discussion in assessment and plan portion of clinic note.  Diamond Lemmaavid Nerida Boivin, MD

## 2016-03-05 NOTE — Assessment & Plan Note (Signed)
As far as I can tell, this is not really any more improved since before I sent her to the hospital. Her weight seemed to have been stable, but there still high. I would like for her to continue diuresis. In order to do so probably need to add Zaroxolyn. The issue is that with right-sided heart failure there is a push pull phenomenon in which we need to allow some time for recuperation. I suspect that her renal function will necessarily worsen intermittently while she is intravascularly deplete. Very difficult to manage right-sided heart failure.  Plan: Add ZAROXOLYN 2.5 MG  TAKE 30 MIN PRIOR TO MORNING DOSE OF LASIX - Monday THRU Friday- DO NOT TAKE ON SATURDAYS OR SUNDAYS.   HAVE HOME HEALTH TO DRAW LABS IN 2 WEEKS: CMP,

## 2016-03-05 NOTE — Assessment & Plan Note (Signed)
Noted on her echocardiogram. I have a hard time believing this is simply related to diastolic heart failure, however this point is a moot point, because she is reluctant to have any invasive or even now noninvasive evaluations. Adding Zaroxolyn for additional diuresis during weekdays. Adding digoxin for increased inotropic effect. Somewhat counterintuitive with diastolic dysfunction, but will help with atrial fibrillation.

## 2016-03-05 NOTE — Assessment & Plan Note (Signed)
I don't think we have really fully broach this topic with her yet. This is something that was suggestive also is in the hospital. I honestly think that at this point in time she is pretty much leaning this direction. She is not extubated any invasive evaluations with either right or left heart catheterization. She is not really extended other further studies. Her right heart pressures on echocardiogram were very high with now persistent anasarca despite having admitted her for IV diuresis.  Think this point we do need to start thinking about goals of care. I had a discussion with her and her daughter today about goals of care. We discussed issues of potential living will and DNR/DNI. She seems to still be in the mindset that she will have everything done, I did explain to her that I did not feel like a resuscitation effort would be successfully were she to go into VF RVT. The other concern will be about her wishes for possible intubation if it came to it. At this point she is not really ready to make those decisions. I encouraged her to continue talking with her PCP. We will have her return in a couple months to rediscuss this. At this point we do not have any advanced directive ideas, I think they need to digest the information provided and at a minimum we are not moving forward with any invasive management. Looking toward potentially avoiding return to the hospital. We are adding digoxin for better rate control and potential inotropic help along with Zaroxolyn for better diuresis. We will need to monitor her labs as a result. She continues to have home health, I did broach the topic of potentially thinking about outpatient palliative care management plus or minus even hospice with her potential assistance plan with care for the patient. This would be a relief for her son and daughter who are taking on the whole brunt of managing her.  I did spent close to an hour with the patient and her daughter today.  Greater than 50% of the time was spent in this discussion.

## 2016-03-09 ENCOUNTER — Other Ambulatory Visit (HOSPITAL_COMMUNITY): Payer: Self-pay | Admitting: Internal Medicine

## 2016-03-09 DIAGNOSIS — R7989 Other specified abnormal findings of blood chemistry: Secondary | ICD-10-CM

## 2016-03-15 ENCOUNTER — Encounter: Payer: Self-pay | Admitting: Cardiology

## 2016-03-15 LAB — PROTIME-INR: INR: 4 — AB (ref 0.9–1.1)

## 2016-03-21 ENCOUNTER — Ambulatory Visit (HOSPITAL_COMMUNITY): Payer: Medicare Other

## 2016-03-21 ENCOUNTER — Encounter (HOSPITAL_COMMUNITY): Payer: Medicare Other

## 2016-03-22 ENCOUNTER — Encounter (HOSPITAL_COMMUNITY): Payer: Medicare Other

## 2016-04-13 ENCOUNTER — Telehealth: Payer: Self-pay | Admitting: Cardiology

## 2016-04-13 NOTE — Telephone Encounter (Signed)
Spoke to daughter. Notes pt has INR followed by PCP via home health. They drew last Thursday most recently, the INR was 2.8 that day. Notes at the time, pt on amoxicillin - still on amoxicillin w last dose tomorrow.  She had episode of epistaxis last night which resolved in 10-15 mins. Called on call w PCP who advised to hold that evening's dose of coumadin and follow up today. Pt taking 2mg  coumadin a day unless o/w directed.  PCP office closed at noon today, so she is calling us for advice.  She has also reached out to home health to see if someone can come out today to do an INR check.   Advised that Dr. Herbie BaltimoreHarding is not here today, reaffirmed instructions to go to urgent care in the case that the patient has a return of bleeding from nose. Daughter notes she uses O2 via [employer] and we discussed that this is a likely precursor for nosebleeds. Daughter aware I will reach out to our coumadin clinic to see if any further advice can be given.

## 2016-04-13 NOTE — Telephone Encounter (Signed)
Agree with Kristin.  David Harding, MD  

## 2016-04-13 NOTE — Telephone Encounter (Signed)
Really can't recommend much, other than to just resume her warfarin after 1 missed dose.  Nosebleeds do not always indicate an elevated INR, and amoxicillin not usually problematic with elevating INR.  Would suggest she continue and check in with her PCP on Monday.  [facility]

## 2016-04-13 NOTE — Telephone Encounter (Signed)
New message      Pt had a nosebleed last night.  She is on warfarin and it is checked by [facility].  They close at noon on fridays. Daughter want to know if Dr Herbie BaltimoreHarding would order home health to come out and check her pt/inr .  Daughter said the the doctor on call at [facility] told her to take her mother to the urgent care.  Daughter says pt has mobility problems and that would be difficult.  Please call daughter

## 2016-04-13 NOTE — Telephone Encounter (Signed)
Returned call, advice communicated to caller, who voiced understanding and thanks.

## 2016-04-17 ENCOUNTER — Inpatient Hospital Stay (HOSPITAL_COMMUNITY)
Admission: EM | Admit: 2016-04-17 | Discharge: 2016-04-26 | DRG: 291 | Disposition: A | Discharge status: Hospice | Payer: Medicare Other | Attending: Internal Medicine | Admitting: Internal Medicine

## 2016-04-17 ENCOUNTER — Encounter (HOSPITAL_COMMUNITY): Payer: Self-pay | Admitting: Emergency Medicine

## 2016-04-17 ENCOUNTER — Emergency Department (HOSPITAL_COMMUNITY): Payer: Medicare Other

## 2016-04-17 DIAGNOSIS — Z6822 Body mass index (BMI) 22.0-22.9, adult: Secondary | ICD-10-CM

## 2016-04-17 DIAGNOSIS — Z8249 Family history of ischemic heart disease and other diseases of the circulatory system: Secondary | ICD-10-CM

## 2016-04-17 DIAGNOSIS — E785 Hyperlipidemia, unspecified: Secondary | ICD-10-CM | POA: Diagnosis present

## 2016-04-17 DIAGNOSIS — I272 Pulmonary hypertension, unspecified: Secondary | ICD-10-CM

## 2016-04-17 DIAGNOSIS — J9611 Chronic respiratory failure with hypoxia: Secondary | ICD-10-CM | POA: Diagnosis present

## 2016-04-17 DIAGNOSIS — I130 Hypertensive heart and chronic kidney disease with heart failure and stage 1 through stage 4 chronic kidney disease, or unspecified chronic kidney disease: Secondary | ICD-10-CM | POA: Diagnosis not present

## 2016-04-17 DIAGNOSIS — E0590 Thyrotoxicosis, unspecified without thyrotoxic crisis or storm: Secondary | ICD-10-CM | POA: Diagnosis present

## 2016-04-17 DIAGNOSIS — I10 Essential (primary) hypertension: Secondary | ICD-10-CM | POA: Diagnosis not present

## 2016-04-17 DIAGNOSIS — Z7901 Long term (current) use of anticoagulants: Secondary | ICD-10-CM

## 2016-04-17 DIAGNOSIS — R601 Generalized edema: Secondary | ICD-10-CM | POA: Diagnosis not present

## 2016-04-17 DIAGNOSIS — Z515 Encounter for palliative care: Secondary | ICD-10-CM

## 2016-04-17 DIAGNOSIS — Z888 Allergy status to other drugs, medicaments and biological substances status: Secondary | ICD-10-CM

## 2016-04-17 DIAGNOSIS — I959 Hypotension, unspecified: Secondary | ICD-10-CM | POA: Diagnosis not present

## 2016-04-17 DIAGNOSIS — N183 Chronic kidney disease, stage 3 (moderate): Secondary | ICD-10-CM | POA: Diagnosis present

## 2016-04-17 DIAGNOSIS — M7989 Other specified soft tissue disorders: Secondary | ICD-10-CM | POA: Diagnosis not present

## 2016-04-17 DIAGNOSIS — I5082 Biventricular heart failure: Secondary | ICD-10-CM | POA: Diagnosis present

## 2016-04-17 DIAGNOSIS — R1310 Dysphagia, unspecified: Secondary | ICD-10-CM | POA: Diagnosis present

## 2016-04-17 DIAGNOSIS — Z66 Do not resuscitate: Secondary | ICD-10-CM | POA: Diagnosis not present

## 2016-04-17 DIAGNOSIS — I482 Chronic atrial fibrillation, unspecified: Secondary | ICD-10-CM | POA: Diagnosis present

## 2016-04-17 DIAGNOSIS — I081 Rheumatic disorders of both mitral and tricuspid valves: Secondary | ICD-10-CM | POA: Diagnosis present

## 2016-04-17 DIAGNOSIS — Z9981 Dependence on supplemental oxygen: Secondary | ICD-10-CM

## 2016-04-17 DIAGNOSIS — Z800 Family history of malignant neoplasm of digestive organs: Secondary | ICD-10-CM

## 2016-04-17 DIAGNOSIS — T501X5A Adverse effect of loop [high-ceiling] diuretics, initial encounter: Secondary | ICD-10-CM | POA: Diagnosis present

## 2016-04-17 DIAGNOSIS — I509 Heart failure, unspecified: Secondary | ICD-10-CM

## 2016-04-17 DIAGNOSIS — I5033 Acute on chronic diastolic (congestive) heart failure: Secondary | ICD-10-CM | POA: Diagnosis not present

## 2016-04-17 DIAGNOSIS — I2510 Atherosclerotic heart disease of native coronary artery without angina pectoris: Secondary | ICD-10-CM | POA: Diagnosis present

## 2016-04-17 DIAGNOSIS — E876 Hypokalemia: Secondary | ICD-10-CM | POA: Diagnosis present

## 2016-04-17 DIAGNOSIS — R609 Edema, unspecified: Secondary | ICD-10-CM

## 2016-04-17 DIAGNOSIS — Z79899 Other long term (current) drug therapy: Secondary | ICD-10-CM

## 2016-04-17 DIAGNOSIS — L8990 Pressure ulcer of unspecified site, unspecified stage: Secondary | ICD-10-CM | POA: Insufficient documentation

## 2016-04-17 DIAGNOSIS — Z8673 Personal history of transient ischemic attack (TIA), and cerebral infarction without residual deficits: Secondary | ICD-10-CM

## 2016-04-17 DIAGNOSIS — E43 Unspecified severe protein-calorie malnutrition: Secondary | ICD-10-CM | POA: Diagnosis present

## 2016-04-17 DIAGNOSIS — Z91040 Latex allergy status: Secondary | ICD-10-CM

## 2016-04-17 HISTORY — DX: Unspecified osteoarthritis, unspecified site: M19.90

## 2016-04-17 HISTORY — DX: Cerebral infarction, unspecified: I63.9

## 2016-04-17 LAB — COMPREHENSIVE METABOLIC PANEL
ALBUMIN: 2.7 g/dL — AB (ref 3.5–5.0)
ALT: 21 U/L (ref 14–54)
ANION GAP: 8 (ref 5–15)
AST: 36 U/L (ref 15–41)
Alkaline Phosphatase: 154 U/L — ABNORMAL HIGH (ref 38–126)
BILIRUBIN TOTAL: 1.4 mg/dL — AB (ref 0.3–1.2)
BUN: 49 mg/dL — AB (ref 6–20)
CALCIUM: 9 mg/dL (ref 8.9–10.3)
CHLORIDE: 103 mmol/L (ref 101–111)
CO2: 31 mmol/L (ref 22–32)
CREATININE: 0.95 mg/dL (ref 0.44–1.00)
GFR calc Af Amer: 60 mL/min — ABNORMAL LOW (ref >60)
GFR calc non Af Amer: 52 mL/min — ABNORMAL LOW (ref >60)
Glucose, Bld: 120 mg/dL — ABNORMAL HIGH (ref 65–99)
POTASSIUM: 3.5 mmol/L (ref 3.5–5.1)
SODIUM: 142 mmol/L (ref 135–145)
Total Protein: 6.8 g/dL (ref 6.5–8.1)

## 2016-04-17 LAB — TROPONIN I

## 2016-04-17 LAB — CBC WITH DIFFERENTIAL/PLATELET
BASOS PCT: 0 %
Basophils Absolute: 0 10*3/uL (ref 0.0–0.1)
EOS ABS: 0.3 10*3/uL (ref 0.0–0.7)
EOS PCT: 4 %
HCT: 33.8 % — ABNORMAL LOW (ref 36.0–46.0)
Hemoglobin: 10.9 g/dL — ABNORMAL LOW (ref 12.0–15.0)
Lymphocytes Relative: 17 %
Lymphs Abs: 1.2 10*3/uL (ref 0.7–4.0)
MCH: 26.1 pg (ref 26.0–34.0)
MCHC: 32.2 g/dL (ref 30.0–36.0)
MCV: 81.1 fL (ref 78.0–100.0)
MONO ABS: 0.8 10*3/uL (ref 0.1–1.0)
MONOS PCT: 11 %
Neutro Abs: 5 10*3/uL (ref 1.7–7.7)
Neutrophils Relative %: 68 %
PLATELETS: 218 10*3/uL (ref 150–400)
RBC: 4.17 MIL/uL (ref 3.87–5.11)
RDW: 18.8 % — AB (ref 11.5–15.5)
WBC: 7.2 10*3/uL (ref 4.0–10.5)

## 2016-04-17 LAB — BRAIN NATRIURETIC PEPTIDE: B NATRIURETIC PEPTIDE 5: 1670.6 pg/mL — AB (ref 0.0–100.0)

## 2016-04-17 LAB — LIPASE, BLOOD: LIPASE: 30 U/L (ref 11–51)

## 2016-04-17 LAB — I-STAT TROPONIN, ED: Troponin i, poc: 0.01 ng/mL (ref 0.00–0.08)

## 2016-04-17 LAB — PROTIME-INR
INR: 3.64
Prothrombin Time: 37.1 seconds — ABNORMAL HIGH (ref 11.4–15.2)

## 2016-04-17 LAB — T4, FREE: FREE T4: 3.31 ng/dL — AB (ref 0.61–1.12)

## 2016-04-17 LAB — DIGOXIN LEVEL: Digoxin Level: 0.8 ng/mL (ref 0.8–2.0)

## 2016-04-17 LAB — TSH

## 2016-04-17 MED ORDER — POTASSIUM CHLORIDE CRYS ER 20 MEQ PO TBCR
40.0000 meq | EXTENDED_RELEASE_TABLET | Freq: Two times a day (BID) | ORAL | Status: DC
  Administered 2016-04-17: 40 meq via ORAL
  Filled 2016-04-17 (×2): qty 2

## 2016-04-17 MED ORDER — SODIUM CHLORIDE 0.9% FLUSH: 3.0000 mL | INTRAVENOUS | Status: DC | PRN

## 2016-04-17 MED ORDER — ONDANSETRON HCL 4 MG/2ML IJ SOLN: 4.0000 mg | Freq: Four times a day (QID) | INTRAMUSCULAR | Status: DC | PRN

## 2016-04-17 MED ORDER — ACETAMINOPHEN 500 MG PO TABS: 500.0000 mg | ORAL_TABLET | Freq: Every day | ORAL | Status: DC | PRN

## 2016-04-17 MED ORDER — SODIUM CHLORIDE 0.9 % IV SOLN: 250.0000 mL | INTRAVENOUS | Status: DC | PRN

## 2016-04-17 MED ORDER — METOPROLOL TARTRATE 25 MG PO TABS: 25.0000 mg | ORAL_TABLET | Freq: Two times a day (BID) | ORAL | Status: DC

## 2016-04-17 MED ORDER — SODIUM CHLORIDE 0.9% FLUSH
3.0000 mL | Freq: Two times a day (BID) | INTRAVENOUS | Status: DC
  Administered 2016-04-17 – 2016-04-26 (×7): 3 mL via INTRAVENOUS

## 2016-04-17 MED ORDER — DIGOXIN 125 MCG PO TABS
0.1250 mg | ORAL_TABLET | Freq: Every day | ORAL | Status: DC
  Administered 2016-04-18 – 2016-04-26 (×9): 0.125 mg via ORAL
  Filled 2016-04-17 (×9): qty 1

## 2016-04-17 MED ORDER — MAGNESIUM 250 MG PO TABS: 1.0000 | ORAL_TABLET | Freq: Every day | ORAL | Status: DC

## 2016-04-17 MED ORDER — MAGNESIUM OXIDE 400 (241.3 MG) MG PO TABS
200.0000 mg | ORAL_TABLET | Freq: Every day | ORAL | Status: DC
  Administered 2016-04-17 – 2016-04-26 (×10): 200 mg via ORAL
  Filled 2016-04-17 (×11): qty 1

## 2016-04-17 MED ORDER — FUROSEMIDE 10 MG/ML IJ SOLN
80.0000 mg | Freq: Two times a day (BID) | INTRAMUSCULAR | Status: DC
  Administered 2016-04-18: 80 mg via INTRAVENOUS
  Filled 2016-04-17: qty 8

## 2016-04-17 MED ORDER — METOPROLOL TARTRATE 25 MG PO TABS
25.0000 mg | ORAL_TABLET | Freq: Two times a day (BID) | ORAL | Status: DC
  Administered 2016-04-17 – 2016-04-26 (×15): 25 mg via ORAL
  Filled 2016-04-17 (×17): qty 1

## 2016-04-17 MED ORDER — ACETAMINOPHEN 325 MG PO TABS
650.0000 mg | ORAL_TABLET | ORAL | Status: DC | PRN
  Administered 2016-04-26: 650 mg via ORAL
  Filled 2016-04-17: qty 2

## 2016-04-17 MED ORDER — FUROSEMIDE 10 MG/ML IJ SOLN
80.0000 mg | Freq: Once | INTRAMUSCULAR | Status: AC
  Administered 2016-04-17: 80 mg via INTRAVENOUS
  Filled 2016-04-17: qty 8

## 2016-04-17 MED ORDER — METOLAZONE 2.5 MG PO TABS
2.5000 mg | ORAL_TABLET | Freq: Every day | ORAL | Status: DC
  Administered 2016-04-18 – 2016-04-26 (×9): 2.5 mg via ORAL
  Filled 2016-04-17 (×10): qty 1

## 2016-04-17 MED ORDER — LORATADINE 10 MG PO TABS
10.0000 mg | ORAL_TABLET | Freq: Every day | ORAL | Status: DC
  Administered 2016-04-19 – 2016-04-26 (×6): 10 mg via ORAL
  Filled 2016-04-17 (×7): qty 1

## 2016-04-17 NOTE — Progress Notes (Signed)
ANTICOAGULATION CONSULT NOTE - Initial Consult  Pharmacy Consult for coumadin Indication: atrial fibrillation  Allergies  Allergen Reactions  . Pravachol [Pravastatin Sodium] Other (See Comments)    Myalgias  . Latex Itching    Patient Measurements: Height: 5\' 2"  (157.5 cm) Weight: 124 lb 11.2 oz (56.6 kg) IBW/kg (Calculated) : 50.1  Vital Signs: Temp: 97.5 F (36.4 C) (10/17 2052) Temp Source: Oral (10/17 2052) BP: 99/52 (10/17 2052) Pulse Rate: 63 (10/17 2052)  Labs:  Recent Labs  04/17/16 1611 04/17/16 2116  HGB 10.9*  --   HCT 33.8*  --   PLT 218  --   LABPROT  --  37.1*  INR  --  3.64  CREATININE 0.95  --   TROPONINI  --  <0.03    Estimated Creatinine Clearance: 31.8 mL/min (by C-G formula based on SCr of 0.95 mg/dL).   Medical History: Past Medical History:  Diagnosis Date  . CHF (congestive heart failure) (HCC)   . Chronic atrial fibrillation (HCC)    controlled on metoprolol and anticoagulated with Coumadin(Dr Ramachandran) .  Marland Kitchen H/O echocardiogram 09/20/2009   EF greater than 55%, no wall motion, bilateral atrial dilation.  Mild aortic sclerosis with no stenosis  . History of nuclear stress test 09/20/2009   Myoview : meduim sized, moderate intensity perfusion defect in the basal inferior wall with moderate reversibility thought to be High Risk scan; deferred invasive evaluation based on patient wishes.  . Hyperlipemia   . Hypertension   . Renal insufficiency 02/09/2016    Medications:  Prescriptions Prior to Admission  Medication Sig Dispense Refill Last Dose  . Acetaminophen (TYLENOL EXTRA STRENGTH PO) Take 500 mg by mouth daily as needed (PAIN).    Taking  . Calcium Carb-Cholecalciferol (CALCIUM 600 + D PO) Take 1 tablet by mouth 2 (two) times daily.   Taking  . digoxin (LANOXIN) 0.125 MG tablet Take 1 tablet (0.125 mg total) by mouth daily. 90 tablet 3   . furosemide (LASIX) 80 MG tablet Take 1 tablet (80 mg total) by mouth 2 (two) times  daily. 30 tablet 6 Taking  . loratadine (CLARITIN) 10 MG tablet Take 10 mg by mouth daily.   Taking  . Magnesium 250 MG TABS Take 1 tablet by mouth daily.   Taking  . metolazone (ZAROXOLYN) 2.5 MG tablet Take 1 tablet (2.5 mg total) by mouth daily. Monday THROUGH Friday - 30 minutes before morning LASIX Dose 90 tablet 3   . metoprolol tartrate (LOPRESSOR) 25 MG tablet Take 1 tablet (25 mg total) by mouth 2 (two) times daily. 60 tablet 12 Taking  . NITROSTAT 0.4 MG SL tablet TAKE 1 TABLET UNDER TONGUE EVERY 5 MINUTES AS NEEDED FOR CHEST PAIN 25 tablet 1 Taking  . Omega-3 Fatty Acids (FISH OIL) 1000 MG CAPS Take 1 capsule by mouth 2 (two) times daily.   Taking  . OXYGEN Inhale 2 L into the lungs continuous. DX: Hypoxia   Taking  . potassium chloride SA (K-DUR,KLOR-CON) 20 MEQ tablet Take 40 mEq by mouth 2 (two) times daily.   Taking  . warfarin (COUMADIN) 2 MG tablet Take 2 mg by mouth every morning.   Taking   Scheduled:  . [START ON 04/18/2016] digoxin  0.125 mg Oral Daily  . [START ON 04/18/2016] furosemide  80 mg Intravenous BID  . [START ON 04/18/2016] loratadine  10 mg Oral Daily  . magnesium oxide  200 mg Oral Daily  . [START ON 04/18/2016] metolazone  2.5 mg  Oral QPC breakfast  . metoprolol tartrate  25 mg Oral BID  . potassium chloride SA  40 mEq Oral BID  . sodium chloride flush  3 mL Intravenous Q12H    Assessment: 82 yo female here with leg swelling and noted with HF. She is on coumadin PTA for afib and pharmacy consulted to dose. -INR= 3.64   Goal of Therapy:  INR 2-3 Monitor platelets by anticoagulation protocol: Yes   Plan:  -Hold coumadin tonight -Daily PT/INR  Harland GermanAndrew Mirna Sutcliffe, Pharm D 04/17/2016 10:23 PM

## 2016-04-17 NOTE — ED Provider Notes (Signed)
I saw and evaluated the patient, reviewed the resident's note and I agree with the findings and plan with the following exceptions.   82 year old female recently started on Zaroxolyn for severe CHF. Also recently hospitalized for about 6 days back in July for anasarca. Over the last week she's had increased lower extremity edema pitting and weeping. No respiratory symptoms. No chest pain. Significant pitting edema all the way to her hips. Cachectic In her upper body. Slightly tachypneic. Concern for fluid overload. Likely needs hospitalization and IV diuretics. We'll also evaluate for myxedema however her mental status is appropriate so this is less likely. Will likely need admission.   EKG Interpretation  Date/Time:  Tuesday April 17 2016 15:31:44 EDT Ventricular Rate:  67 PR Interval:    QRS Duration: 101 QT Interval:  345 QTC Calculation: 365 R Axis:   134 Text Interpretation:  Atrial fibrillation Probable lateral infarct, age indeterminate Anteroseptal infarct, age indeterminate Baseline wander in lead(s) V4 Poor data quality in current ECG precludes serial comparison NO CHARGE, NEEDS REPEAT Confirmed by [facility] MD, Paiton Fosco [phone]) on 04/17/2016 3:58:58 PM         Marily MemosJason Ngoc Daughtridge, MD 04/19/16 [phone]

## 2016-04-17 NOTE — ED Triage Notes (Signed)
Pt arrives via [facility] from home for c/o leg swelling x3 days. Pt has hx of chf, wears 2L O2 at home. Pt denies sob or pain, ems reports pt had some trouble standing which family also reported they had noticed. Pt a/o, nad.

## 2016-04-17 NOTE — ED Notes (Signed)
This RN inquired if patient could eat or drink, Dr. Alfonso Patteneans advised patient may have sips of water at this time

## 2016-04-17 NOTE — ED Notes (Signed)
Family at bedside. 

## 2016-04-17 NOTE — ED Provider Notes (Signed)
[facility]-EMERGENCY DEPT Provider Note   CSN: [id number] Arrival date & time: 04/17/16  1522     History   Chief Complaint Chief Complaint  Patient presents with  . Leg Swelling    HPI Diamond Thomas is a 82 y.o. female.  Patient presents for 3 days of worsening swelling and worsening shortness of breath.  She wears 2L O2 at home.  Patient also has epigastric abdominal pain.    Shortness of Breath  This is a chronic problem. The average episode lasts 3 days. The problem occurs continuously.The problem has been gradually worsening. Associated symptoms include abdominal pain and leg swelling. Pertinent negatives include no fever, no headaches, no rhinorrhea, no sore throat, no ear pain, no cough, no wheezing, no chest pain, no vomiting and no rash. She has tried nothing for the symptoms. Associated medical issues include CAD and heart failure.    Past Medical History:  Diagnosis Date  . CHF (congestive heart failure) (HCC)   . Chronic atrial fibrillation (HCC)    controlled on metoprolol and anticoagulated with Coumadin(Dr Ramachandran) .  Marland Kitchen. H/O echocardiogram 09/20/2009   EF greater than 55%, no wall motion, bilateral atrial dilation.  Mild aortic sclerosis with no stenosis  . History of nuclear stress test 09/20/2009   Myoview : meduim sized, moderate intensity perfusion defect in the basal inferior wall with moderate reversibility thought to be High Risk scan; deferred invasive evaluation based on patient wishes.  . Hyperlipemia   . Hypertension   . Renal insufficiency 02/09/2016    Patient Active Problem List   Diagnosis Date Noted  . Palliative care encounter 03/05/2016  . Medication management 03/02/2016  . Pulmonary artery hypertension 02/09/2016  . Renal insufficiency 02/09/2016  . Anasarca 01/26/2016  . Protein-calorie malnutrition (HCC) 01/26/2016  . Edema of both legs 07/21/2015  . Chronic atrial fibrillation [facility](HCC):  CHA2DS2-VASc Score 5, On Warfarin   .  Hyperlipemia   . Chronic anticoagulation - with warfarin 08/27/2012  . Essential hypertension 08/26/2012  . High risk NST 08/2009, pt refused diagnostic heart cath, EF >55% by echo 08/2009 08/26/2012  . Acid reflux disease 08/26/2012    Past Surgical History:  Procedure Laterality Date  . TONSILLECTOMY      OB History    No data available       Home Medications    Prior to Admission medications   Medication Sig Start Date End Date Taking? Authorizing Provider  Acetaminophen (TYLENOL EXTRA STRENGTH PO) Take 500 mg by mouth daily as needed (PAIN).     Historical Provider, MD  Calcium Carb-Cholecalciferol (CALCIUM 600 + D PO) Take 1 tablet by mouth 2 (two) times daily.    Historical Provider, MD  digoxin (LANOXIN) 0.125 MG tablet Take 1 tablet (0.125 mg total) by mouth daily. 03/02/16   Marykay Lexavid W Harding, MD  furosemide (LASIX) 80 MG tablet Take 1 tablet (80 mg total) by mouth 2 (two) times daily. 02/17/16   Azalee CourseHao Meng, PA  loratadine (CLARITIN) 10 MG tablet Take 10 mg by mouth daily.    Historical Provider, MD  Magnesium 250 MG TABS Take 1 tablet by mouth daily.    Historical Provider, MD  metolazone (ZAROXOLYN) 2.5 MG tablet Take 1 tablet (2.5 mg total) by mouth daily. Monday THROUGH Friday - 30 minutes before morning LASIX Dose 03/02/16 05/31/16  Marykay Lexavid W Harding, MD  metoprolol tartrate (LOPRESSOR) 25 MG tablet Take 1 tablet (25 mg total) by mouth 2 (two) times daily. 02/03/16   Denny PeonErin  Candy Sledge, NP  NITROSTAT 0.4 MG SL tablet TAKE 1 TABLET UNDER TONGUE EVERY 5 MINUTES AS NEEDED FOR CHEST PAIN 10/12/14   Lennette Bihari, MD  Omega-3 Fatty Acids (FISH OIL) 1000 MG CAPS Take 1 capsule by mouth 2 (two) times daily.    Historical Provider, MD  OXYGEN Inhale 2 L into the lungs continuous. DX: Hypoxia    Historical Provider, MD  potassium chloride SA (K-DUR,KLOR-CON) 20 MEQ tablet Take 40 mEq by mouth 2 (two) times daily.    Historical Provider, MD  warfarin (COUMADIN) 2 MG tablet Take 2 mg by mouth every  morning.    Historical Provider, MD    Family History Family History  Problem Relation Age of Onset  . Hypertension Mother   . Stomach cancer Father   . Stroke Neg Hx   . Diabetes Neg Hx   . Heart disease Neg Hx   . Heart attack Neg Hx     Social History Social History  Substance Use Topics  . Smoking status: Never Smoker  . Smokeless tobacco: Never Used  . Alcohol use No     Allergies   Pravachol [pravastatin sodium] and Latex   Review of Systems Review of Systems  Constitutional: Negative for chills and fever.  HENT: Negative for ear pain, rhinorrhea and sore throat.   Eyes: Negative for pain and visual disturbance.  Respiratory: Positive for shortness of breath. Negative for cough and wheezing.   Cardiovascular: Positive for leg swelling. Negative for chest pain and palpitations.  Gastrointestinal: Positive for abdominal pain. Negative for vomiting.  Genitourinary: Negative for dysuria and hematuria.  Musculoskeletal: Positive for arthralgias (right knee, chronic). Negative for back pain.  Skin: Negative for color change and rash.  Neurological: Negative for seizures, syncope and headaches.  All other systems reviewed and are negative.    Physical Exam Updated Vital Signs BP 111/57   Pulse 69   Resp 20   SpO2 100%   Physical Exam  Constitutional: She is oriented to person, place, and time. She appears well-developed.  Cachectic, frail appearing.  HENT:  Head: Normocephalic and atraumatic.  Eyes: Conjunctivae and EOM are normal. Pupils are equal, round, and reactive to light.  Neck: Normal range of motion. Neck supple.  Cardiovascular: Normal rate and regular rhythm.   No murmur heard. Pulmonary/Chest: Effort normal and breath sounds normal.  Abdominal: Soft. There is tenderness (epigastric).  Musculoskeletal: She exhibits edema (to bilateral legs, full length. 3+).  Neurological: She is alert and oriented to person, place, and time.  Skin: Skin is  warm and dry.  Psychiatric: She has a normal mood and affect.  Nursing note and vitals reviewed.    ED Treatments / Results  Labs (all labs ordered are listed, but only abnormal results are displayed) Labs Reviewed  CBC WITH DIFFERENTIAL/PLATELET - Abnormal; Notable for the following:       Result Value   Hemoglobin 10.9 (*)    HCT 33.8 (*)    RDW 18.8 (*)    All other components within normal limits  COMPREHENSIVE METABOLIC PANEL - Abnormal; Notable for the following:    Glucose, Bld 120 (*)    BUN 49 (*)    Albumin 2.7 (*)    Alkaline Phosphatase 154 (*)    Total Bilirubin 1.4 (*)    GFR calc non Af Amer 52 (*)    GFR calc Af Amer 60 (*)    All other components within normal limits  BRAIN NATRIURETIC PEPTIDE -  Abnormal; Notable for the following:    B Natriuretic Peptide 1,670.6 (*)    All other components within normal limits  TSH - Abnormal; Notable for the following:    TSH <0.010 (*)    All other components within normal limits  T4, FREE - Abnormal; Notable for the following:    Free T4 3.31 (*)    All other components within normal limits  PROTIME-INR - Abnormal; Notable for the following:    Prothrombin Time 37.1 (*)    All other components within normal limits  LIPASE, BLOOD  DIGOXIN LEVEL  TROPONIN I  BASIC METABOLIC PANEL  TROPONIN I  TROPONIN I  PROTIME-INR  I-STAT TROPOININ, ED    EKG  EKG Interpretation  Date/Time:  Tuesday April 17 2016 15:31:44 EDT Ventricular Rate:  67 PR Interval:    QRS Duration: 101 QT Interval:  345 QTC Calculation: 365 R Axis:   134 Text Interpretation:  Atrial fibrillation Probable lateral infarct, age indeterminate Anteroseptal infarct, age indeterminate Baseline wander in lead(s) V4 Poor data quality in current ECG precludes serial comparison NO CHARGE, NEEDS REPEAT Confirmed by [facility] MD, Barbara Cower [phone]) on 04/17/2016 3:58:58 PM Also confirmed by [facility] MD, JASON [phone]), editor [place] CT, Jola Babinski [phone])  on  04/17/2016 4:21:27 PM       Radiology Dg Chest Portable 1 View  Result Date: 04/17/2016 CLINICAL DATA:  Shortness of breath. EXAM: PORTABLE CHEST 1 VIEW COMPARISON:  Radiograph of January 27, 2016. FINDINGS: Stable cardiomegaly. Atherosclerosis of thoracic aorta is noted. Increased bilateral lung opacities are noted, right greater than left. This is concerning for edema or pneumonia or atelectasis with associated pleural effusions. No pneumothorax is noted. Bony thorax is unremarkable. IMPRESSION: Aortic atherosclerosis. Increased bilateral lung opacities are noted concerning for worsening edema, pneumonia or atelectasis with associated pleural effusions, right greater than left. Electronically Signed   By: Lupita Raider, M.D.   On: 04/17/2016 17:36    Procedures Procedures (including critical care time)  Medications Ordered in ED Medications  potassium chloride SA (K-DUR,KLOR-CON) CR tablet 40 mEq (40 mEq Oral Given 04/17/16 2210)  metolazone (ZAROXOLYN) tablet 2.5 mg (not administered)  loratadine (CLARITIN) tablet 10 mg (not administered)  digoxin (LANOXIN) tablet 0.125 mg (not administered)  sodium chloride flush (NS) 0.9 % injection 3 mL (3 mLs Intravenous Given 04/17/16 2217)  sodium chloride flush (NS) 0.9 % injection 3 mL (not administered)  0.9 %  sodium chloride infusion (not administered)  acetaminophen (TYLENOL) tablet 650 mg (not administered)  ondansetron (ZOFRAN) injection 4 mg (not administered)  furosemide (LASIX) injection 80 mg (not administered)  metoprolol tartrate (LOPRESSOR) tablet 25 mg (25 mg Oral Given 04/17/16 2209)  magnesium oxide (MAG-OX) tablet 200 mg (200 mg Oral Given 04/17/16 2210)  furosemide (LASIX) injection 80 mg (80 mg Intravenous Given 04/17/16 1844)     Initial Impression / Assessment and Plan / ED Course  I have reviewed the triage vital signs and the nursing notes.  Pertinent labs & imaging results that were available during my care of the  patient were reviewed by me and considered in my medical decision making (see chart for details).  Clinical Course   Patient presents for worsening anasarca.  History of CAD, CHF, afib.  Reportedly hypoxic at home; here on home 2L O2, she is saturating well.  She has 24 hour assistance at home and is compliant with her medications.  EKG obtained, demonstrates a-fib without RVR.  Labs ordered, including CBC, CMP, lipase, BNP,  troponin, thyroid studies.  Significant for anemia (baseline), elevated free T4, low TSH, and significantly elevated BNP.  CXR obtained, personally reviewed by me, demonstrates bilateral lung opacities concerning for worsening edema.  Given IV lasix.  Patient is admitted to hospitalist for CHF exacerbation.  Final Clinical Impressions(s) / ED Diagnoses   Final diagnoses:  Acute on chronic congestive heart failure, unspecified congestive heart failure type [facility])  Peripheral edema    New Prescriptions New Prescriptions   No medications on file     Garey Ham, MD 04/18/16 0045    Marily Memos, MD 04/19/16 [phone]

## 2016-04-17 NOTE — H&P (Signed)
History and Physical    Helayna Dun Eades ZOX:[id number] DOB: 1925/01/06 DOA: 04/17/2016   PCP: Georgianne Fick, MD Chief Complaint:  Chief Complaint  Patient presents with  . Leg Swelling    HPI: RITHIKA SEEL is a 82 y.o. female with medical history significant of R heart failure, likely silent MIs, high risk NST 08/2009 but decision made by patient to not cath.  Long standing h/o A.Fib on coumadin and rate controlled since 2011.  LVEF normal on echo.  Kagan Hietpas Pore was last seen by Dr. Herbie Baltimore on 01/26/2016 and hospitalized for diuresis.  She was referred for worsening edema and 20 pound gain since her January visit. No response to increased dose oral Lasix. Noted to have protein malnutrition.  2D echo demonstrated Moderate to severely dilated RV with mild-moderately reduced dysfunction. Severe RA dilation with CVP estimated at 15 mmHg. Severe Pulmonary Hypertension with PA pressures estimated 70 mmHg.  She has been on 80mg  lasix PO BID at home.  She remains a full code despite being "do not cath".  Patient presents to the ED for 3 days of worsening peripheral edema, SOB.  Symptoms not helped by home lasix.  She has peripheral edema all the way up to her hip.  No CP, no wheezing, no vomiting, no fevers.  ED Course: Patient clearly volume overloaded, given 80 IV lasix.  Review of Systems: As per HPI otherwise 10 point review of systems negative.    Past Medical History:  Diagnosis Date  . CHF (congestive heart failure) (HCC)   . Chronic atrial fibrillation (HCC)    controlled on metoprolol and anticoagulated with Coumadin(Dr Ramachandran) .  Marland Kitchen H/O echocardiogram 09/20/2009   EF greater than 55%, no wall motion, bilateral atrial dilation.  Mild aortic sclerosis with no stenosis  . History of nuclear stress test 09/20/2009   Myoview : meduim sized, moderate intensity perfusion defect in the basal inferior wall with moderate reversibility thought to be High Risk scan; deferred invasive  evaluation based on patient wishes.  . Hyperlipemia   . Hypertension   . Renal insufficiency 02/09/2016    Past Surgical History:  Procedure Laterality Date  . TONSILLECTOMY       reports that she has never smoked. She has never used smokeless tobacco. She reports that she does not drink alcohol or use drugs.  Allergies  Allergen Reactions  . Pravachol [Pravastatin Sodium] Other (See Comments)    Myalgias  . Latex Itching    Family History  Problem Relation Age of Onset  . Hypertension Mother   . Stomach cancer Father   . Stroke Neg Hx   . Diabetes Neg Hx   . Heart disease Neg Hx   . Heart attack Neg Hx       Prior to Admission medications   Medication Sig Start Date End Date Taking? Authorizing Provider  Acetaminophen (TYLENOL EXTRA STRENGTH PO) Take 500 mg by mouth daily as needed (PAIN).     Historical Provider, MD  Calcium Carb-Cholecalciferol (CALCIUM 600 + D PO) Take 1 tablet by mouth 2 (two) times daily.    Historical Provider, MD  digoxin (LANOXIN) 0.125 MG tablet Take 1 tablet (0.125 mg total) by mouth daily. 03/02/16   Marykay Lex, MD  furosemide (LASIX) 80 MG tablet Take 1 tablet (80 mg total) by mouth 2 (two) times daily. 02/17/16   Azalee Course, PA  loratadine (CLARITIN) 10 MG tablet Take 10 mg by mouth daily.    Historical Provider, MD  Magnesium 250 MG TABS Take 1 tablet by mouth daily.    Historical Provider, MD  metolazone (ZAROXOLYN) 2.5 MG tablet Take 1 tablet (2.5 mg total) by mouth daily. Monday THROUGH Friday - 30 minutes before morning LASIX Dose 03/02/16 05/31/16  Marykay Lex, MD  metoprolol tartrate (LOPRESSOR) 25 MG tablet Take 1 tablet (25 mg total) by mouth 2 (two) times daily. 02/03/16   Little Ishikawa, NP  NITROSTAT 0.4 MG SL tablet TAKE 1 TABLET UNDER TONGUE EVERY 5 MINUTES AS NEEDED FOR CHEST PAIN 10/12/14   Lennette Bihari, MD  Omega-3 Fatty Acids (FISH OIL) 1000 MG CAPS Take 1 capsule by mouth 2 (two) times daily.    Historical Provider, MD  OXYGEN  Inhale 2 L into the lungs continuous. DX: Hypoxia    Historical Provider, MD  potassium chloride SA (K-DUR,KLOR-CON) 20 MEQ tablet Take 40 mEq by mouth 2 (two) times daily.    Historical Provider, MD  warfarin (COUMADIN) 2 MG tablet Take 2 mg by mouth every morning.    Historical Provider, MD    Physical Exam: Vitals:   04/17/16 1815 04/17/16 1830 04/17/16 1845 04/17/16 1900  BP: 109/68 96/83 103/68 131/83  Pulse: 63 (!) 55 60 (!) 59  Resp:  16 22 22   SpO2: 98% 100% 99% 99%      Constitutional: NAD, calm, comfortable Eyes: PERRL, lids and conjunctivae normal ENMT: Mucous membranes are moist. Posterior pharynx clear of any exudate or lesions.Normal dentition.  Neck: normal, supple, no masses, no thyromegaly Respiratory: clear to auscultation bilaterally, no wheezing, no crackles. Normal respiratory effort. No accessory muscle use.  Cardiovascular: irr/ irr, no murmurs / rubs / gallops. 4+ pitting edema. 2+ pedal pulses. No carotid bruits.  Abdomen: no tenderness, no masses palpated. No hepatosplenomegaly. Bowel sounds positive.  Musculoskeletal: no clubbing / cyanosis. No joint deformity upper and lower extremities. Good ROM, no contractures. Normal muscle tone.  Skin: no rashes, lesions, ulcers. No induration Neurologic: CN 2-12 grossly intact. Sensation intact, DTR normal. Strength 5/5 in all 4.  Psychiatric: Normal judgment and insight. Alert and oriented x 3. Normal mood.    Labs on Admission: I have personally reviewed following labs and imaging studies  CBC:  Recent Labs Lab 04/17/16 1611  WBC 7.2  NEUTROABS 5.0  HGB 10.9*  HCT 33.8*  MCV 81.1  PLT 218   Basic Metabolic Panel:  Recent Labs Lab 04/17/16 1611  NA 142  K 3.5  CL 103  CO2 31  GLUCOSE 120*  BUN 49*  CREATININE 0.95  CALCIUM 9.0   GFR: CrCl cannot be calculated (Unknown ideal weight.). Liver Function Tests:  Recent Labs Lab 04/17/16 1611  AST 36  ALT 21  ALKPHOS 154*  BILITOT 1.4*    PROT 6.8  ALBUMIN 2.7*    Recent Labs Lab 04/17/16 1611  LIPASE 30   No results for input(s): AMMONIA in the last 168 hours. Coagulation Profile: No results for input(s): INR, PROTIME in the last 168 hours. Cardiac Enzymes: No results for input(s): CKTOTAL, CKMB, CKMBINDEX, TROPONINI in the last 168 hours. BNP (last 3 results) No results for input(s): PROBNP in the last 8760 hours. HbA1C: No results for input(s): HGBA1C in the last 72 hours. CBG: No results for input(s): GLUCAP in the last 168 hours. Lipid Profile: No results for input(s): CHOL, HDL, LDLCALC, TRIG, CHOLHDL, LDLDIRECT in the last 72 hours. Thyroid Function Tests:  Recent Labs  04/17/16 1540 04/17/16 1611  TSH <0.010*  --  FREET4  --  3.31*   Anemia Panel: No results for input(s): VITAMINB12, FOLATE, FERRITIN, TIBC, IRON, RETICCTPCT in the last 72 hours. Urine analysis:    Component Value Date/Time   COLORURINE YELLOW 01/27/2016 1253   APPEARANCEUR CLEAR 01/27/2016 1253   LABSPEC 1.011 01/27/2016 1253   PHURINE 6.5 01/27/2016 1253   GLUCOSEU NEGATIVE 01/27/2016 1253   HGBUR NEGATIVE 01/27/2016 1253   BILIRUBINUR NEGATIVE 01/27/2016 1253   KETONESUR NEGATIVE 01/27/2016 1253   PROTEINUR NEGATIVE 01/27/2016 1253   UROBILINOGEN 0.2 10/03/2014 1657   NITRITE NEGATIVE 01/27/2016 1253   LEUKOCYTESUR NEGATIVE 01/27/2016 1253   Sepsis Labs: @LABRCNTIP (procalcitonin:4,lacticidven:4) )No results found for this or any previous visit (from the past 240 hour(s)).   Radiological Exams on Admission: Dg Chest Portable 1 View  Result Date: 04/17/2016 CLINICAL DATA:  Shortness of breath. EXAM: PORTABLE CHEST 1 VIEW COMPARISON:  Radiograph of January 27, 2016. FINDINGS: Stable cardiomegaly. Atherosclerosis of thoracic aorta is noted. Increased bilateral lung opacities are noted, right greater than left. This is concerning for edema or pneumonia or atelectasis with associated pleural effusions. No pneumothorax is  noted. Bony thorax is unremarkable. IMPRESSION: Aortic atherosclerosis. Increased bilateral lung opacities are noted concerning for worsening edema, pneumonia or atelectasis with associated pleural effusions, right greater than left. Electronically Signed   By: Lupita RaiderJames  Green Jr, M.D.   On: 04/17/2016 17:36    EKG: Independently reviewed.  Assessment/Plan Principal Problem:   Anasarca Active Problems:   Essential hypertension   Chronic atrial fibrillation [facility](HCC):  CHA2DS2-VASc Score 5, On Warfarin   Palliative care encounter   Acute on chronic diastolic CHF (congestive heart failure) (HCC)    1. Anasarca due to acute on chronic R heart failure - 1. CHF pathway 2. Lasix 80mg  IV BID 3. Daily BMP 2. HTN - continue home metoprolol, watch for hypotension 3. A.Fib - rate controlled, continue coumadin 4. Palliative care encounter - long code status discussion with family, still full code for now but remains a "do not cath".  They will discuss this further.   DVT prophylaxis: Coumadin Code Status: Full Family Communication: Family at bedside Consults called: None Admission status: Admit to obs   GARDNER, Heywood IlesJARED M. DO [facility] Pager [phone] from 7PM-7AM  If 7AM-7PM, please contact the day physician for the patient [email or web address] Password [email or web address]  04/17/2016, 7:48 PM

## 2016-04-17 NOTE — ED Notes (Signed)
Pt ate 3/4 of meal, used the bed pan 3 times and denies any pain at this time.  Pt moved to the floor

## 2016-04-18 DIAGNOSIS — I5023 Acute on chronic systolic (congestive) heart failure: Secondary | ICD-10-CM

## 2016-04-18 DIAGNOSIS — I509 Heart failure, unspecified: Secondary | ICD-10-CM | POA: Diagnosis not present

## 2016-04-18 DIAGNOSIS — Z91040 Latex allergy status: Secondary | ICD-10-CM | POA: Diagnosis not present

## 2016-04-18 DIAGNOSIS — R601 Generalized edema: Secondary | ICD-10-CM | POA: Diagnosis not present

## 2016-04-18 DIAGNOSIS — N183 Chronic kidney disease, stage 3 (moderate): Secondary | ICD-10-CM | POA: Diagnosis present

## 2016-04-18 DIAGNOSIS — Z9981 Dependence on supplemental oxygen: Secondary | ICD-10-CM | POA: Diagnosis not present

## 2016-04-18 DIAGNOSIS — I482 Chronic atrial fibrillation: Secondary | ICD-10-CM | POA: Diagnosis not present

## 2016-04-18 DIAGNOSIS — I10 Essential (primary) hypertension: Secondary | ICD-10-CM | POA: Diagnosis not present

## 2016-04-18 DIAGNOSIS — Z8673 Personal history of transient ischemic attack (TIA), and cerebral infarction without residual deficits: Secondary | ICD-10-CM | POA: Diagnosis not present

## 2016-04-18 DIAGNOSIS — Z800 Family history of malignant neoplasm of digestive organs: Secondary | ICD-10-CM | POA: Diagnosis not present

## 2016-04-18 DIAGNOSIS — R1310 Dysphagia, unspecified: Secondary | ICD-10-CM | POA: Diagnosis present

## 2016-04-18 DIAGNOSIS — I081 Rheumatic disorders of both mitral and tricuspid valves: Secondary | ICD-10-CM | POA: Diagnosis present

## 2016-04-18 DIAGNOSIS — I959 Hypotension, unspecified: Secondary | ICD-10-CM | POA: Diagnosis not present

## 2016-04-18 DIAGNOSIS — I2510 Atherosclerotic heart disease of native coronary artery without angina pectoris: Secondary | ICD-10-CM | POA: Diagnosis present

## 2016-04-18 DIAGNOSIS — Z8249 Family history of ischemic heart disease and other diseases of the circulatory system: Secondary | ICD-10-CM | POA: Diagnosis not present

## 2016-04-18 DIAGNOSIS — I5082 Biventricular heart failure: Secondary | ICD-10-CM | POA: Diagnosis present

## 2016-04-18 DIAGNOSIS — T501X5A Adverse effect of loop [high-ceiling] diuretics, initial encounter: Secondary | ICD-10-CM | POA: Diagnosis present

## 2016-04-18 DIAGNOSIS — Z515 Encounter for palliative care: Secondary | ICD-10-CM

## 2016-04-18 DIAGNOSIS — I5033 Acute on chronic diastolic (congestive) heart failure: Secondary | ICD-10-CM | POA: Diagnosis not present

## 2016-04-18 DIAGNOSIS — J9611 Chronic respiratory failure with hypoxia: Secondary | ICD-10-CM | POA: Diagnosis present

## 2016-04-18 DIAGNOSIS — E785 Hyperlipidemia, unspecified: Secondary | ICD-10-CM | POA: Diagnosis present

## 2016-04-18 DIAGNOSIS — E876 Hypokalemia: Secondary | ICD-10-CM | POA: Diagnosis present

## 2016-04-18 DIAGNOSIS — I272 Pulmonary hypertension, unspecified: Secondary | ICD-10-CM | POA: Diagnosis not present

## 2016-04-18 DIAGNOSIS — E0590 Thyrotoxicosis, unspecified without thyrotoxic crisis or storm: Secondary | ICD-10-CM | POA: Diagnosis present

## 2016-04-18 DIAGNOSIS — I130 Hypertensive heart and chronic kidney disease with heart failure and stage 1 through stage 4 chronic kidney disease, or unspecified chronic kidney disease: Secondary | ICD-10-CM | POA: Diagnosis present

## 2016-04-18 DIAGNOSIS — E43 Unspecified severe protein-calorie malnutrition: Secondary | ICD-10-CM | POA: Diagnosis present

## 2016-04-18 DIAGNOSIS — Z66 Do not resuscitate: Secondary | ICD-10-CM | POA: Diagnosis not present

## 2016-04-18 DIAGNOSIS — Z6822 Body mass index (BMI) 22.0-22.9, adult: Secondary | ICD-10-CM | POA: Diagnosis not present

## 2016-04-18 DIAGNOSIS — M7989 Other specified soft tissue disorders: Secondary | ICD-10-CM | POA: Diagnosis present

## 2016-04-18 LAB — TROPONIN I

## 2016-04-18 LAB — BASIC METABOLIC PANEL
ANION GAP: 9 (ref 5–15)
BUN: 52 mg/dL — ABNORMAL HIGH (ref 6–20)
CALCIUM: 9.1 mg/dL (ref 8.9–10.3)
CO2: 31 mmol/L (ref 22–32)
CREATININE: 0.95 mg/dL (ref 0.44–1.00)
Chloride: 100 mmol/L — ABNORMAL LOW (ref 101–111)
GFR, EST AFRICAN AMERICAN: 60 mL/min — AB (ref >60)
GFR, EST NON AFRICAN AMERICAN: 52 mL/min — AB (ref >60)
Glucose, Bld: 121 mg/dL — ABNORMAL HIGH (ref 65–99)
Potassium: 3.3 mmol/L — ABNORMAL LOW (ref 3.5–5.1)
Sodium: 140 mmol/L (ref 135–145)

## 2016-04-18 LAB — PROTIME-INR
INR: 3.35
PROTHROMBIN TIME: 34.7 s — AB (ref 11.4–15.2)

## 2016-04-18 MED ORDER — POTASSIUM CHLORIDE 20 MEQ/15ML (10%) PO SOLN
40.0000 meq | Freq: Two times a day (BID) | ORAL | Status: DC
  Administered 2016-04-18: 40 meq via ORAL
  Filled 2016-04-18: qty 30

## 2016-04-18 MED ORDER — PHENOL 1.4 % MT LIQD
1.0000 | OROMUCOSAL | Status: DC | PRN
  Administered 2016-04-18: 1 via OROMUCOSAL
  Filled 2016-04-18: qty 177

## 2016-04-18 MED ORDER — GUAIFENESIN 100 MG/5ML PO SOLN: 5.0000 mL | ORAL | Status: DC | PRN

## 2016-04-18 MED ORDER — IPRATROPIUM-ALBUTEROL 0.5-2.5 (3) MG/3ML IN SOLN
RESPIRATORY_TRACT | Status: AC
  Filled 2016-04-18: qty 3

## 2016-04-18 MED ORDER — IPRATROPIUM-ALBUTEROL 0.5-2.5 (3) MG/3ML IN SOLN
3.0000 mL | Freq: Four times a day (QID) | RESPIRATORY_TRACT | Status: DC
  Administered 2016-04-18 – 2016-04-19 (×4): 3 mL via RESPIRATORY_TRACT
  Filled 2016-04-18 (×3): qty 3

## 2016-04-18 MED ORDER — MENTHOL 3 MG MT LOZG
1.0000 | LOZENGE | OROMUCOSAL | Status: DC | PRN
  Filled 2016-04-18: qty 9

## 2016-04-18 MED ORDER — POTASSIUM CHLORIDE 10 MEQ/100ML IV SOLN
10.0000 meq | INTRAVENOUS | Status: AC
  Administered 2016-04-18 (×3): 10 meq via INTRAVENOUS
  Filled 2016-04-18 (×3): qty 100

## 2016-04-18 MED ORDER — FUROSEMIDE 10 MG/ML IJ SOLN
15.0000 mg/h | INTRAVENOUS | Status: DC
  Administered 2016-04-18 – 2016-04-21 (×3): 10 mg/h via INTRAVENOUS
  Administered 2016-04-21 – 2016-04-24 (×4): 15 mg/h via INTRAVENOUS
  Filled 2016-04-18 (×14): qty 25

## 2016-04-18 MED ORDER — PANTOPRAZOLE SODIUM 40 MG PO TBEC
40.0000 mg | DELAYED_RELEASE_TABLET | Freq: Every day | ORAL | Status: DC
  Administered 2016-04-18 – 2016-04-26 (×9): 40 mg via ORAL
  Filled 2016-04-18 (×9): qty 1

## 2016-04-18 MED ORDER — METHIMAZOLE 5 MG PO TABS
5.0000 mg | ORAL_TABLET | Freq: Two times a day (BID) | ORAL | Status: DC
  Administered 2016-04-18 – 2016-04-26 (×16): 5 mg via ORAL
  Filled 2016-04-18 (×18): qty 1

## 2016-04-18 NOTE — Progress Notes (Addendum)
PROGRESS NOTE  Diamond Thomas:[id number] DOB: [birth year] DOA: 04/17/2016 PCP: Georgianne Fick, MD   LOS: 0 days   Brief Narrative:  82 y.o. female with medical history significant of R heart failure, long standing h/o A.Fib on coumadin and rate controlled since 2011, admitted on 10/17 with significant fluid overload and shortness of breath.  Assessment & Plan: Principal Problem:   Anasarca Active Problems:   Essential hypertension   Chronic atrial fibrillation (HCC):  CHA2DS2-VASc Score 5, On Warfarin   Palliative care encounter   Acute on chronic diastolic CHF (congestive heart failure) (HCC)    Acute on chronic right heart failure - Patient was started on IV Lasix 80 mg twice a day, continue, weight is same this morning however she just started on that, continue daily weights and strict I's and O's - Cardiology was consulted, appreciate input  Acute on chronic confusion / suspect underlying dementia - Discussed with patient's daughter over the phone, patient was confused this morning did not wear she is, and the daughter mentions that she occasionally gets this at home. I wonder whether she has mild underlying dementia  Hypertension - She is on metoprolol, continue  Dysphagia / recurrent aspiration - Patient on PPI at home, daughter is aware that she occasionally has coughing spells when she eats and has difficulties with pills at home. - Aspiration event this morning with liquid potassium - SLP to evaluate  Atrial fibrillation - patient's CHA2DS2-VASc Score for Stroke Risk is greater than 2, anticoagulate with Coumadin per pharmacy - Continue metoprolol and digoxin  Goals of care discussion - Discussed with daughter over the phone, patient has expressed her wishes in the past and reiterated full CODE STATUS at this time. - Daughter aware of risk of aspiration  Hyperthyroidism - Patient's TSH was undetectable and free T4 was elevated to 3.3 - Supposed to get  thyroid scan as an outpatient - Her A. fib is currently well controlled - start methimazole  Chronic hypoxic respiratory failure - Continue home oxygen 2 L nasal cannula, respiratory status stable after aspiration without need for increased oxygen requirements    DVT prophylaxis: Coumadin Code Status: Full code Family Communication: Discussed with daughter over the phone Disposition Plan: TBD  Consultants:   Cardiology  Procedures:   None   Antimicrobials:  None    Subjective: - confused, alert, no specific complaints  Objective: Vitals:   04/17/16 1900 04/17/16 1915 04/17/16 2052 04/18/16 0558  BP: 131/83 110/56 (!) 99/52 (!) 117/58  Pulse: (!) 59 (!) 58 63 78  Resp: 22 19 18 18   Temp:   97.5 F (36.4 C) 97.6 F (36.4 C)  TempSrc:   Oral Oral  SpO2: 99% 100% 98% 97%  Weight:   56.6 kg (124 lb 11.2 oz) 56.6 kg (124 lb 11.2 oz)  Height:   5\' 2"  (1.575 m)     Intake/Output Summary (Last 24 hours) at 04/18/16 1130 Last data filed at 04/18/16 1000  Gross per 24 hour  Intake              240 ml  Output              950 ml  Net             -710 ml   Filed Weights   04/17/16 2052 04/18/16 0558  Weight: 56.6 kg (124 lb 11.2 oz) 56.6 kg (124 lb 11.2 oz)    Examination: Constitutional: NAD, ill appearing Vitals:   04/17/16 1900  04/17/16 1915 04/17/16 2052 04/18/16 0558  BP: 131/83 110/56 (!) 99/52 (!) 117/58  Pulse: (!) 59 (!) 58 63 78  Resp: 22 19 18 18   Temp:   97.5 F (36.4 C) 97.6 F (36.4 C)  TempSrc:   Oral Oral  SpO2: 99% 100% 98% 97%  Weight:   56.6 kg (124 lb 11.2 oz) 56.6 kg (124 lb 11.2 oz)  Height:   5\' 2"  (1.575 m)    Eyes: PERRL, lids and conjunctivae normal Neck: normal, supple, no masses, no thyromegaly Respiratory: Overall Decreased breath sounds, no wheezing  Cardiovascular: Irregular, no MRG, 3+ pitting edema  Abdomen: no tenderness. Bowel sounds positive.  Musculoskeletal: no clubbing / cyanosis.  Skin: no rashes, lesions, ulcers.  No induration Neurologic: non focal  Psychiatric: AxO to person only   Data Reviewed: I have personally reviewed following labs and imaging studies  CBC:  Recent Labs Lab 04/17/16 1611  WBC 7.2  NEUTROABS 5.0  HGB 10.9*  HCT 33.8*  MCV 81.1  PLT 218   Basic Metabolic Panel:  Recent Labs Lab 04/17/16 1611 04/18/16 0233  NA 142 140  K 3.5 3.3*  CL 103 100*  CO2 31 31  GLUCOSE 120* 121*  BUN 49* 52*  CREATININE 0.95 0.95  CALCIUM 9.0 9.1   GFR: Estimated Creatinine Clearance: 31.8 mL/min (by C-G formula based on SCr of 0.95 mg/dL). Liver Function Tests:  Recent Labs Lab 04/17/16 1611  AST 36  ALT 21  ALKPHOS 154*  BILITOT 1.4*  PROT 6.8  ALBUMIN 2.7*    Recent Labs Lab 04/17/16 1611  LIPASE 30   No results for input(s): AMMONIA in the last 168 hours. Coagulation Profile:  Recent Labs Lab 04/17/16 2116 04/18/16 0856  INR 3.64 3.35   Cardiac Enzymes:  Recent Labs Lab 04/17/16 2116 04/18/16 0233 04/18/16 0856  TROPONINI <0.03 <0.03 <0.03   Thyroid Function Tests:  Recent Labs  04/17/16 1540 04/17/16 1611  TSH <0.010*  --   FREET4  --  3.31*   Anemia Panel: No results for input(s): VITAMINB12, FOLATE, FERRITIN, TIBC, IRON, RETICCTPCT in the last 72 hours. Urine analysis:    Component Value Date/Time   COLORURINE YELLOW 01/27/2016 1253   APPEARANCEUR CLEAR 01/27/2016 1253   LABSPEC 1.011 01/27/2016 1253   PHURINE 6.5 01/27/2016 1253   GLUCOSEU NEGATIVE 01/27/2016 1253   HGBUR NEGATIVE 01/27/2016 1253   BILIRUBINUR NEGATIVE 01/27/2016 1253   KETONESUR NEGATIVE 01/27/2016 1253   PROTEINUR NEGATIVE 01/27/2016 1253   UROBILINOGEN 0.2 10/03/2014 1657   NITRITE NEGATIVE 01/27/2016 1253   LEUKOCYTESUR NEGATIVE 01/27/2016 1253   Sepsis Labs: Invalid input(s): PROCALCITONIN, LACTICIDVEN  No results found for this or any previous visit (from the past 240 hour(s)).    Radiology Studies: Dg Chest Portable 1 View  Result Date:  04/17/2016 CLINICAL DATA:  Shortness of breath. EXAM: PORTABLE CHEST 1 VIEW COMPARISON:  Radiograph of January 27, 2016. FINDINGS: Stable cardiomegaly. Atherosclerosis of thoracic aorta is noted. Increased bilateral lung opacities are noted, right greater than left. This is concerning for edema or pneumonia or atelectasis with associated pleural effusions. No pneumothorax is noted. Bony thorax is unremarkable. IMPRESSION: Aortic atherosclerosis. Increased bilateral lung opacities are noted concerning for worsening edema, pneumonia or atelectasis with associated pleural effusions, right greater than left. Electronically Signed   By: Lupita Raider, M.D.   On: 04/17/2016 17:36     Scheduled Meds: . digoxin  0.125 mg Oral Daily  . furosemide  80 mg  Intravenous BID  . ipratropium-albuterol  3 mL Nebulization Q6H  . loratadine  10 mg Oral Daily  . magnesium oxide  200 mg Oral Daily  . metolazone  2.5 mg Oral QPC breakfast  . metoprolol tartrate  25 mg Oral BID  . potassium chloride  10 mEq Intravenous Q1 Hr x 3  . sodium chloride flush  3 mL Intravenous Q12H   Continuous Infusions:    Pamella Pertostin Nara Paternoster, MD, PhD [facility] Pager [phone]  If 7PM-7AM, please contact night-coverage [email or web address] Password [email or web address] 04/18/2016, 11:30 AM

## 2016-04-18 NOTE — Care Management Obs Status (Signed)
MEDICARE OBSERVATION STATUS NOTIFICATION   Patient Details  Name: Diamond Thomas MRN: [id number] Date of Birth: 11/01/1924   Medicare Observation Status Notification Given:  Yes    Darrold SpanWebster, Zeba Luby Hall, RN 04/18/2016, 3:19 PM

## 2016-04-18 NOTE — Progress Notes (Signed)
Patient began coughing after attempting to swallow a small amount of potassium solution.  Patient could not stop coughing.  Respiratory was called.  MD was called.  Rapid Response was called.  Patient was examined and lung sounds were unremarkable.  O2 saturations were 95-97 on 2 liters [place].  Suction was applied to help with patient's comfort while coughing up sputum.  MD ordered neb tx.  MD ordered Speech consult and made patient NPO.

## 2016-04-18 NOTE — Progress Notes (Signed)
ANTICOAGULATION CONSULT NOTE Pharmacy Consult for Coumadin Indication: atrial fibrillation  Allergies  Allergen Reactions  . Pravachol [Pravastatin Sodium] Other (See Comments)    Myalgias  . Latex Itching    Patient Measurements: Height: 5\' 2"  (157.5 cm) Weight: 124 lb 11.2 oz (56.6 kg) IBW/kg (Calculated) : 50.1  Vital Signs: Temp: 97.6 F (36.4 C) (10/18 0558) Temp Source: Oral (10/18 0558) BP: 117/58 (10/18 0558) Pulse Rate: 78 (10/18 0558)  Labs:  Recent Labs  04/17/16 1611 04/17/16 2116 04/18/16 0233 04/18/16 0856  HGB 10.9*  --   --   --   HCT 33.8*  --   --   --   PLT 218  --   --   --   LABPROT  --  37.1*  --  34.7*  INR  --  3.64  --  3.35  CREATININE 0.95  --  0.95  --   TROPONINI  --  <0.03 <0.03 <0.03    Estimated Creatinine Clearance: 31.8 mL/min (by C-G formula based on SCr of 0.95 mg/dL).   Assessment: 82 yo female here with leg swelling and noted with HF. She is on coumadin PTA for afib and pharmacy consulted to dose. -INR= 3.35   Goal of Therapy:  INR 2-3 Monitor platelets by anticoagulation protocol: Yes   Plan:  -Hold coumadin tonight -Daily PT/INR  Thank you Okey RegalLisa Harace Mccluney, PharmD [phone]  04/18/2016 10:37 AM

## 2016-04-18 NOTE — Consult Note (Signed)
Cardiology Consult    Patient ID: Diamond Thomas MRN: [id number], DOB/AGE: 1924/10/08   Admit date: 04/17/2016 Date of Consult: 04/18/2016  Primary Physician: Georgianne Fick, MD Primary Cardiologist: Dr. Herbie Baltimore Requesting Provider: Dr. Elvera Lennox Reason for Consultation: CHF  Patient Profile    82 y.o. female with PMH of chronic afib on coumadin, chronic diastolic HF, HTN pulmonary HTN and HLD who presented with increasing dyspnea and increased edema.   Past Medical History   Past Medical History:  Diagnosis Date  . Arthritis    "knees mostly" (04/17/2016)  . CHF (congestive heart failure) (HCC)   . Chronic atrial fibrillation (HCC)    controlled on metoprolol and anticoagulated with Coumadin(Dr Ramachandran) .  Marland Kitchen H/O echocardiogram 09/20/2009   EF greater than 55%, no wall motion, bilateral atrial dilation.  Mild aortic sclerosis with no stenosis  . History of nuclear stress test 09/20/2009   Myoview : meduim sized, moderate intensity perfusion defect in the basal inferior wall with moderate reversibility thought to be High Risk scan; deferred invasive evaluation based on patient wishes.  . Hyperlipemia   . Hypertension   . Renal insufficiency 02/09/2016  . Stroke [facility])    "mini stroke ~ 10 yr ago; no problems now" (04/17/2016)    Past Surgical History:  Procedure Laterality Date  . CATARACT EXTRACTION W/ INTRAOCULAR LENS IMPLANT     "can't remember if it was both eyes" (04/17/2016)  . TONSILLECTOMY       Allergies  Allergies  Allergen Reactions  . Pravachol [Pravastatin Sodium] Other (See Comments)    Myalgias  . Latex Itching    History of Present Illness    Diamond Thomas is a 82 yo female with PMH of chronic afib on coumadin, chronic diastolic HF, HTN pulmonary HTN and HLD. Appears she was first dx with AF in 2011, she had moderate to high risk abnormal Myoview stress test showing medium-sized moderate severity perfusion defect in the basal inferior and  inferior wall with moderate reversibility. She did not wish to undergo coronary angiography at the time. Echocardiogram obtained in 2011 showed normal EF. Since this time she has has multiple admissions for CHF and pulmonary HTN. On 02/17/16 her lasix was increased to 80mg  BID and then dc'ed at the next visit a few weeks later due to hypotension. Decision was made to hold her ARB to allow blood pressure room for lasix. Last EF in 7/17 showed EF of 60-65% with no WMA, severe RA enlargement, moderate TR, severe pulmonary HTN and PASP of .   She was last seen in the office by Dr. Herbie Baltimore. She was noted to be relatively stable at that time, but still had significant edema and dyspnea with walking around her home. The idea of palliative care was approached by Dr. Herbie Baltimore with the patient and her daughter and they stated they would think about it. Digoxin 0.125mg , along with zaroxolyn 2.5mg  were added to her medications in attempts to help with her AF and chronic edema.   Reports she currently lives at home by herself and daughter/son help with her care. She is unable to tell me why she came to the hospital, but reports her breathing seems to have gotten worse over the past couple of days. In the ED her labs showed BNP 1670 (587) in 8/17, Hgb 10.9, stable electrolytes. CXR with increased bilateral lung opacities concerning for worsening edema with right greater then left. She was started on IV lasix 80mg  BID. Weight has not changed, and little  UOP is documented.   Inpatient Medications    . digoxin  0.125 mg Oral Daily  . furosemide  80 mg Intravenous BID  . ipratropium-albuterol  3 mL Nebulization Q6H  . ipratropium-albuterol      . loratadine  10 mg Oral Daily  . magnesium oxide  200 mg Oral Daily  . methimazole  5 mg Oral BID  . metolazone  2.5 mg Oral QPC breakfast  . metoprolol tartrate  25 mg Oral BID  . pantoprazole  40 mg Oral Daily  . potassium chloride  10 mEq Intravenous Q1 Hr x 3  .  sodium chloride flush  3 mL Intravenous Q12H    Family History    Family History  Problem Relation Age of Onset  . Hypertension Mother   . Stomach cancer Father   . Stroke Neg Hx   . Diabetes Neg Hx   . Heart disease Neg Hx   . Heart attack Neg Hx     Social History    Social History   Social History  . Marital status: Widowed    Spouse name: N/A  . Number of children: N/A  . Years of education: N/A   Occupational History  . Not on file.   Social History Main Topics  . Smoking status: Never Smoker  . Smokeless tobacco: Never Used  . Alcohol use No  . Drug use: No  . Sexual activity: No   Other Topics Concern  . Not on file   Social History Narrative   She widowed ,2 children ,1 grandchild . 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 No tobacco use . No alcohol use. No chewing tobacco      Review of Systems    General:  No chills, fever, night sweats, weight changes.  Cardiovascular:  No chest pain, dyspnea on exertion, + edema, orthopnea, palpitations, paroxysmal nocturnal dyspnea. Dermatological: No rash, lesions/masses Respiratory: See HPI Urologic: No hematuria, dysuria Abdominal:   No nausea, vomiting, diarrhea, bright red blood per rectum, melena, or hematemesis Neurologic:  No visual changes, wkns, changes in mental status. All other systems reviewed and are otherwise negative except as noted above.  Physical Exam    Blood pressure (!) 117/58, pulse 78, temperature 97.6 F (36.4 C), temperature source Oral, resp. rate 18, height 5\' 2"  (1.575 m), weight 124 lb 11.2 oz (56.6 kg), SpO2 95 %.  General: Frail, ill appearing older WF, NAD Psych: Normal affect. Neuro: Alert and oriented X 3. Moves all extremities spontaneously. HEENT: Normal  Neck: Supple without bruits, + JVD. Lungs:  Resp regular and unlabored, CTA. Heart: Irregularly irregular no s3, s4, 3/6 systolic murmur. Abdomen: Soft, non-tender, non-distended, BS + x 4.  Extremities:  No clubbing, cyanosis, 2+ pitting edema up to knees with venous statis bilaterally. DP/PT/Radials 2+ and equal bilaterally.  Labs    Troponin [facility](Point of Care Test)  Recent Labs  04/17/16 1619  TROPIPOC 0.01    Recent Labs  04/17/16 2116 04/18/16 0233 04/18/16 0856  TROPONINI <0.03 <0.03 <0.03   Lab Results  Component Value Date   WBC 7.2 04/17/2016   HGB 10.9 (L) 04/17/2016   HCT 33.8 (L) 04/17/2016   MCV 81.1 04/17/2016   PLT 218 04/17/2016    Recent Labs Lab 04/17/16 1611 04/18/16 0233  NA 142 140  K 3.5 3.3*  CL 103 100*  CO2 31 31  BUN 49* 52*  CREATININE 0.95 0.95  CALCIUM 9.0 9.1  PROT 6.8  --  BILITOT 1.4*  --   ALKPHOS 154*  --   ALT 21  --   AST 36  --   GLUCOSE 120* 121*   No results found for: CHOL, HDL, LDLCALC, TRIG No results found for: [facility]   Radiology Studies    Dg Chest Portable 1 View  Result Date: 04/17/2016 CLINICAL DATA:  Shortness of breath. EXAM: PORTABLE CHEST 1 VIEW COMPARISON:  Radiograph of January 27, 2016. FINDINGS: Stable cardiomegaly. Atherosclerosis of thoracic aorta is noted. Increased bilateral lung opacities are noted, right greater than left. This is concerning for edema or pneumonia or atelectasis with associated pleural effusions. No pneumothorax is noted. Bony thorax is unremarkable. IMPRESSION: Aortic atherosclerosis. Increased bilateral lung opacities are noted concerning for worsening edema, pneumonia or atelectasis with associated pleural effusions, right greater than left. Electronically Signed   By: Lupita Raider, M.D.   On: 04/17/2016 17:36    ECG & Cardiac Imaging    EKG: AF, lots of artifact noted.   Echo: 01/27/16  Study Conclusions  - Left ventricle: The cavity size was normal. Wall thickness was   normal. Systolic function was normal. The estimated ejection   fraction was in the range of 60% to 65%. Wall motion was normal;   there were no regional wall motion abnormalities. The study is   not  technically sufficient to allow evaluation of LV diastolic   function. - Ventricular septum: The contour showed diastolic flattening and   systolic flattening. - Aortic valve: Mildly calcified annulus. Trileaflet; mildly   calcified leaflets. - Mitral valve: Calcified annulus. There was mild regurgitation. - Left atrium: The atrium was moderately dilated. - Right ventricle: The cavity size was moderately to severely   dilated. Systolic function was mildly to moderately reduced. - Right atrium: The atrium was severely dilated. Central venous   pressure (est): 15 mm Hg. - Atrial septum: No defect or patent foramen ovale was identified. - Tricuspid valve: There was moderate regurgitation. - Pulmonary arteries: Systolic pressure was severely increased. PA   peak pressure: 70 mm Hg (S). - Pericardium, extracardiac: A trivial pericardial effusion was   identified.  Impressions:  - Normal LV wall thickness with LVEF 60-65%. Indeterminate   diastolic function in the setting of atrial fibrillation. LV   septal flattening noted consistent with RV pressure and volume   overload. Moderate left atrial enlargement. MAC with mild mitral   regurgitation. Mildly sclerotic aortic valve. Moderate to severe   RV enlargement with moderately decreased contraction. Severe   right atrial enlargement with elevated estimated CVP. At least   moderate tricuspid regurgitation noted with evidence of severe   pulmonary hypertension and PASP estimated 70 mmHg. Trivial   pericardial effusion.  Assessment & Plan    82 y.o. female with PMH of chronic afib on coumadin, chronic diastolic HF, HTN pulmonary HTN and HLD who presented with increasing dyspnea and increased edema.   1. Acute on Chronic diastolic CHF: known to have significant chronic lower extremity edema that has been difficult to manage in the outpatient setting. Was recently placed on Metolazone 2.5mg  daily in attempts to help. Placed on IV lasix 80mg   BID on admission with little UOP thus far, along with metolazone 2.5mg  daily.  -- Would continue with IV lasix and metolazone. It is difficult to say what her baseline volume status is, but does have +JVD and edema noted on CXR.   2. Chronic AF: On metoprolol and dig (added at last office visit). Rate is  controlled. On chronic anticoagulation with coumadin.  -- This patients CHA2DS2-VASc Score and unadjusted Ischemic Stroke Rate (% per year) is equal to 7.2 % stroke rate/year from a score of 5 Above score calculated as 1 point each if present [CHF, HTN, DM, Vascular=MI/PAD/Aortic Plaque, Age if 65-74, or Female] Above score calculated as 2 points each if present [Age > 75, or Stroke/TIA/TE] -- TSH was noted <0.010 likely contributing to worsening HF.   3. Pulmonary HTN: Noted to be severe on last echo, unknown cause, but she is reluctant to pursue further evaluations. Topic of palliative care and goals was brought up at last office visit. Appears consult has been made this admission.   4. HTN: Stable on minimal medications. Will need to monitor with diuresis.     Janice Coffin, NP-C Pager [phone] 04/18/2016, 12:33 PM

## 2016-04-18 NOTE — Evaluation (Signed)
Clinical/Bedside Swallow Evaluation Patient Details  Name: Diamond Thomas MRN: [id number] Date of Birth: Dec 13, 1924  Today's Date: 04/18/2016 Time: SLP Start Time (ACUTE ONLY): 1155 SLP Stop Time (ACUTE ONLY): 1214 SLP Time Calculation (min) (ACUTE ONLY): 19 min  Past Medical History:  Past Medical History:  Diagnosis Date  . Arthritis    "knees mostly" (04/17/2016)  . CHF (congestive heart failure) (HCC)   . Chronic atrial fibrillation (HCC)    controlled on metoprolol and anticoagulated with Coumadin(Dr Ramachandran) .  Marland Kitchen H/O echocardiogram 09/20/2009   EF greater than 55%, no wall motion, bilateral atrial dilation.  Mild aortic sclerosis with no stenosis  . History of nuclear stress test 09/20/2009   Myoview : meduim sized, moderate intensity perfusion defect in the basal inferior wall with moderate reversibility thought to be High Risk scan; deferred invasive evaluation based on patient wishes.  . Hyperlipemia   . Hypertension   . Renal insufficiency 02/09/2016  . Stroke [facility])    "mini stroke ~ 10 yr ago; no problems now" (04/17/2016)   Past Surgical History:  Past Surgical History:  Procedure Laterality Date  . CATARACT EXTRACTION W/ INTRAOCULAR LENS IMPLANT     "can't remember if it was both eyes" (04/17/2016)  . TONSILLECTOMY     HPI:  82 y.o.femalewith medical history significant of R heart failure, likely silent MIs, high risk NST 08/2009 but decision made by patient to not cath. Long standing h/o A.Fib;  protein malnutrition.  Presented to ED after three days worsening peripheral edema, SOB. Lungs CTA.   Coughing spell after consumption of liquid potassium; unremitting. Respiratory and Rapid Response were called. Dtr reports intermittent cough with PO intake at home.   Assessment / Plan / Recommendation Clinical Impression  Pt with unremitting cough, making it difficult to discern presence of a pharyngeal dysphagia.  There are no focal CN deficits.  There is adequate  oral preparation and control.  Swallow response appears  to be swift, but coughing occurs throughout course of assessment, particularly with liquids; pt quite uncomfortable, producing phlegm and self-suctioning.  For now, allow meds whole or crushed - if large- in puree.  Avoid liquids.  SLP will return either this afternoon if schedule permits or next date, once coughing spell has abated.  D/W RN.     Aspiration Risk  Mild aspiration risk    Diet Recommendation     Medication Administration: Whole meds with puree    Other  Recommendations Oral Care Recommendations: Oral care QID   Follow up Recommendations    NPO except purees/ meds whole in puree    Frequency and Duration min 3x week  1 week       Prognosis Prognosis for Safe Diet Advancement: Good      Swallow Study   General HPI: 82 y.o.femalewith medical history significant of R heart failure, likely silent MIs, high risk NST 08/2009 but decision made by patient to not cath. Long standing h/o A.Fib;  protein malnutrition.  Presented to ED after three days worsening peripheral edema, SOB. Lungs CTA.   Coughing spell after consumption of liquid potassium; unremitting. Dtr reports intermittent cough with PO intake at home. Type of Study: Bedside Swallow Evaluation Previous Swallow Assessment: no Diet Prior to this Study: NPO Temperature Spikes Noted: No Respiratory Status: Nasal cannula History of Recent Intubation: No Behavior/Cognition: Alert Oral Cavity Assessment: Within Functional Limits Oral Care Completed by SLP: No Oral Cavity - Dentition: Adequate natural dentition;Poor condition Vision: Functional for self-feeding Self-Feeding  Abilities: Able to feed self Patient Positioning: Upright in bed Baseline Vocal Quality: Normal Volitional Cough: Strong Volitional Swallow: Able to elicit    Oral/Motor/Sensory Function Overall Oral Motor/Sensory Function: Within functional limits   Ice Chips Ice chips:  Impaired Presentation: Spoon Pharyngeal Phase Impairments: Cough - Immediate   Thin Liquid Thin Liquid: Impaired Presentation: Cup;Straw Pharyngeal  Phase Impairments: Cough - Immediate    Nectar Thick Nectar Thick Liquid: Not tested   Honey Thick Honey Thick Liquid: Not tested   Puree Puree: Within functional limits   Solid   GO   Solid: Not tested    Functional Assessment Tool Used: clinical judgment Functional Limitations: Swallowing Swallow Current Status ([id number]): At least 20 percent but less than 40 percent impaired, limited or restricted Swallow Goal Status [phone]): At least 1 percent but less than 20 percent impaired, limited or restricted   Diamond Thomas, Diamond Thomas 04/18/2016,12:21 PM  Marchelle FolksAmanda L. Samson Fredericouture, [place] CCC/SLP Pager [phone]

## 2016-04-18 NOTE — Progress Notes (Addendum)
Called by primary RN to assess patient. Per nursing, patient aspirated PO Potassium solution, per RN, the amount was very small but patient did start coughing, coughing was persistent.  I called RT to assess this patient and that I would come assess this patient as soon I could.  Per RN, VS stable, oxygen sats were 95% on 2L before and after coughing spell started.  I asked RN to page the MD.  On arrival, patient was awake, coughing, able to spit out secretions and use a yankeur, lung sounds were clear, good air movement.  Patient was receiving a DUO-NEB per MD orders.    Lung sounds remained clear, patient will NPO, MD ordered CXR, MD at bedside to evaluate patient. Per MD, patient has this happen to at home per her daughter.  I ordered some cepacol lozenges and spray along with continuous pulse ox.  Patient admitted for fluid overload management and is rate controlled AFIB   Called in the afternoon for a follow up, patient's coughing improved.

## 2016-04-19 LAB — BASIC METABOLIC PANEL
Anion gap: 11 (ref 5–15)
BUN: 49 mg/dL — AB (ref 6–20)
CHLORIDE: 99 mmol/L — AB (ref 101–111)
CO2: 31 mmol/L (ref 22–32)
CREATININE: 1 mg/dL (ref 0.44–1.00)
Calcium: 9.2 mg/dL (ref 8.9–10.3)
GFR calc Af Amer: 56 mL/min — ABNORMAL LOW (ref >60)
GFR calc non Af Amer: 48 mL/min — ABNORMAL LOW (ref >60)
Glucose, Bld: 106 mg/dL — ABNORMAL HIGH (ref 65–99)
Potassium: 2.8 mmol/L — ABNORMAL LOW (ref 3.5–5.1)
SODIUM: 141 mmol/L (ref 135–145)

## 2016-04-19 LAB — CBC
HEMATOCRIT: 34.7 % — AB (ref 36.0–46.0)
HEMOGLOBIN: 11.2 g/dL — AB (ref 12.0–15.0)
MCH: 26 pg (ref 26.0–34.0)
MCHC: 32.3 g/dL (ref 30.0–36.0)
MCV: 80.7 fL (ref 78.0–100.0)
Platelets: 224 10*3/uL (ref 150–400)
RBC: 4.3 MIL/uL (ref 3.87–5.11)
RDW: 18.7 % — ABNORMAL HIGH (ref 11.5–15.5)
WBC: 7.1 10*3/uL (ref 4.0–10.5)

## 2016-04-19 LAB — PROTIME-INR
INR: 2.54
PROTHROMBIN TIME: 27.8 s — AB (ref 11.4–15.2)

## 2016-04-19 MED ORDER — IPRATROPIUM-ALBUTEROL 0.5-2.5 (3) MG/3ML IN SOLN
3.0000 mL | Freq: Two times a day (BID) | RESPIRATORY_TRACT | Status: DC
  Administered 2016-04-19 – 2016-04-20 (×3): 3 mL via RESPIRATORY_TRACT
  Filled 2016-04-19 (×3): qty 3

## 2016-04-19 MED ORDER — WARFARIN - PHARMACIST DOSING INPATIENT
Freq: Every day | Status: DC
  Administered 2016-04-20: 18:00:00

## 2016-04-19 MED ORDER — WARFARIN SODIUM 2 MG PO TABS
2.0000 mg | ORAL_TABLET | Freq: Once | ORAL | Status: AC
  Administered 2016-04-19: 2 mg via ORAL
  Filled 2016-04-19: qty 1

## 2016-04-19 MED ORDER — POTASSIUM CHLORIDE 10 MEQ/100ML IV SOLN
10.0000 meq | INTRAVENOUS | Status: AC
  Administered 2016-04-19 (×4): 10 meq via INTRAVENOUS
  Filled 2016-04-19 (×3): qty 100

## 2016-04-19 MED ORDER — POTASSIUM CHLORIDE 10 MEQ/100ML IV SOLN
10.0000 meq | INTRAVENOUS | Status: DC
  Administered 2016-04-19: 10 meq via INTRAVENOUS
  Filled 2016-04-19: qty 100

## 2016-04-19 NOTE — Progress Notes (Signed)
ANTICOAGULATION CONSULT NOTE Pharmacy Consult for Coumadin Indication: atrial fibrillation  Allergies  Allergen Reactions  . Pravachol [Pravastatin Sodium] Other (See Comments)    Myalgias  . Latex Itching    Patient Measurements: Height: 5\' 2"  (157.5 cm) Weight: 119 lb 12.8 oz (54.3 kg) IBW/kg (Calculated) : 50.1  Vital Signs: Temp: 97.6 F (36.4 C) (10/19 0428) Temp Source: Oral (10/19 0428) BP: 122/49 (10/19 0428) Pulse Rate: 72 (10/19 0428)  Labs:  Recent Labs  04/17/16 1611 04/17/16 2116 04/18/16 0233 04/18/16 0856 04/19/16 0221  HGB 10.9*  --   --   --  11.2*  HCT 33.8*  --   --   --  34.7*  PLT 218  --   --   --  224  LABPROT  --  37.1*  --  34.7* 27.8*  INR  --  3.64  --  3.35 2.54  CREATININE 0.95  --  0.95  --  1.00  TROPONINI  --  <0.03 <0.03 <0.03  --     Estimated Creatinine Clearance: 30.2 mL/min (by C-G formula based on SCr of 1 mg/dL).   Assessment: 82 yo female here with leg swelling and noted with HF. She is on coumadin PTA for afib and pharmacy consulted to dose. -INR= 2.54   Goal of Therapy:  INR 2-3 Monitor platelets by anticoagulation protocol: Yes   Plan:  -Coumadin 2 mg po x 1 -Daily PT/INR  Thank you Okey RegalLisa Eller Sweis, PharmD [phone]  04/19/2016 11:05 AM

## 2016-04-19 NOTE — Progress Notes (Signed)
Speech Language Pathology Treatment: Dysphagia  Patient Details Name: Diamond Thomas MRN: [id number] DOB: 1925-05-14 Today's Date: 04/19/2016 Time: 1206-1219 SLP Time Calculation (min) (ACUTE ONLY): 13 min  Assessment / Plan / Recommendation Clinical Impression  Pt with significantly less coughing today; is much more comfortable and communicative.  Consumed regular solids, thin liquids, and purees with adequate mastication, brisk swallow response, and no overt s/s of aspiration.  Recommend allowing a regular diet; continue meds whole in puree.  No further SLP f/u is warranted - we will sign off.    HPI HPI: 82 y.o.femalewith medical history significant of R heart failure, likely silent MIs, high risk NST 08/2009 but decision made by patient to not cath. Long standing h/o A.Fib;  protein malnutrition.  Presented to ED after three days worsening peripheral edema, SOB. Lungs CTA.   Coughing spell after consumption of liquid potassium; unremitting. Dtr reports intermittent cough with PO intake at home.      SLP Plan  All goals met     Recommendations  Diet recommendations: Regular;Thin liquid Liquids provided via: Cup;Straw Medication Administration: Whole meds with puree Supervision: Patient able to self feed Compensations: Minimize environmental distractions;Slow rate Postural Changes and/or Swallow Maneuvers: Seated upright 90 degrees                Oral Care Recommendations: Oral care BID Follow up Recommendations: None Plan: All goals met       GO               Harshal Sirmon L. Tivis Ringer, [place] CCC/SLP Pager [phone]  Juan Quam Laurice 04/19/2016, 12:20 PM

## 2016-04-19 NOTE — Progress Notes (Signed)
Palliative:  I met briefly with Diamond Thomas - alert x oriented x 2. I have left message with daughter, Helene Kelp, to meet to discuss [place] hopefully 04/20/2016. Will await call back from family. Thank you for this consult.    Vinie Sill, NP Palliative Medicine Team Pager # [phone] (M-F 8a-5p) Team Phone # [phone] (Nights/Weekends)

## 2016-04-19 NOTE — Progress Notes (Signed)
PROGRESS NOTE  Diamond Thomas ZOX:[id number] DOB: [birth year] DOA: 04/17/2016 PCP: Georgianne Fick, MD   LOS: 1 day   Brief Narrative:  82 y.o. female with medical history significant of R heart failure, long standing h/o A.Fib on coumadin and rate controlled since 2011, admitted on 10/17 with significant fluid overload and shortness of breath.  Assessment & Plan: Principal Problem:   Anasarca Active Problems:   Essential hypertension   Chronic atrial fibrillation (HCC):  CHA2DS2-VASc Score 5, On Warfarin   Palliative care encounter   Acute on chronic diastolic CHF (congestive heart failure) (HCC)   Acute on chronic congestive heart failure (HCC)   Moderate to severe pulmonary hypertension   Acute on chronic right heart failure - continue Lasix infusion per cardiology - weight 124 >> 119, she is net negative 3.7 L - renal function is stable  Acute on chronic confusion / suspect underlying dementia - better this morning, knew where she was however not oriented to date - suspect contributing is also her underlying hyperthyroidism   Hypertension - She is on metoprolol, continue  Hypokalemia - due to Lasix, replete  Dysphagia / recurrent aspiration - Patient on PPI at home, daughter is aware that she occasionally has coughing spells when she eats and has difficulties with pills at home. - SLP to see, had mild aspiration event 10/18  Atrial fibrillation - patient's CHA2DS2-VASc Score for Stroke Risk is greater than 2, anticoagulate with Coumadin per pharmacy - Continue metoprolol and digoxin  Goals of care discussion - Discussed with daughter over the phone, patient has expressed her wishes in the past and reiterated full CODE STATUS at this time. - Daughter aware of risk of aspiration  Hyperthyroidism - Patient's TSH was undetectable and free T4 was elevated to 3.3 - Supposed to get thyroid scan as an outpatient - Her A. fib is currently well controlled - start  methimazole. Will need repeat TSH/Free T4 as an outpatient   Chronic hypoxic respiratory failure - Continue home oxygen 2 L nasal cannula, respiratory status stable after aspiration without need for increased oxygen requirements    DVT prophylaxis: Coumadin Code Status: Full code Family Communication: Discussed with daughter over the phone Disposition Plan: TBD  Consultants:   Cardiology  Procedures:   None   Antimicrobials:  None    Subjective: - feels better, not as confused as yesterday   Objective: Vitals:   04/18/16 2131 04/19/16 0024 04/19/16 0100 04/19/16 0428  BP:    (!) 122/49  Pulse:  74  72  Resp:  18  20  Temp:    97.6 F (36.4 C)  TempSrc:    Oral  SpO2: 97% 97% 97% 99%  Weight:    54.3 kg (119 lb 12.8 oz)  Height:        Intake/Output Summary (Last 24 hours) at 04/19/16 1111 Last data filed at 04/19/16 0944  Gross per 24 hour  Intake              100 ml  Output             3125 ml  Net            -3025 ml   Filed Weights   04/17/16 2052 04/18/16 0558 04/19/16 0428  Weight: 56.6 kg (124 lb 11.2 oz) 56.6 kg (124 lb 11.2 oz) 54.3 kg (119 lb 12.8 oz)    Examination: Constitutional: NAD, ill appearing Vitals:   04/18/16 2131 04/19/16 0024 04/19/16 0100 04/19/16 0428  BP:    Marland Kitchen)  122/49  Pulse:  74  72  Resp:  18  20  Temp:    97.6 F (36.4 C)  TempSrc:    Oral  SpO2: 97% 97% 97% 99%  Weight:    54.3 kg (119 lb 12.8 oz)  Height:       Eyes: PERRL, lids and conjunctivae normal Neck: normal, supple, no masses, no thyromegaly Respiratory: Overall Decreased breath sounds, no wheezing  Cardiovascular: Irregular, no MRG, 2+ pitting edema  Abdomen: no tenderness. Bowel sounds positive.  Skin: no rashes, lesions, ulcers. No induration Neurologic: non focal  Psychiatric: AxO to place and person not time   Data Reviewed: I have personally reviewed following labs and imaging studies  CBC:  Recent Labs Lab 04/17/16 1611 04/19/16 0221    WBC 7.2 7.1  NEUTROABS 5.0  --   HGB 10.9* 11.2*  HCT 33.8* 34.7*  MCV 81.1 80.7  PLT 218 224   Basic Metabolic Panel:  Recent Labs Lab 04/17/16 1611 04/18/16 0233 04/19/16 0221  NA 142 140 141  K 3.5 3.3* 2.8*  CL 103 100* 99*  CO2 31 31 31   GLUCOSE 120* 121* 106*  BUN 49* 52* 49*  CREATININE 0.95 0.95 1.00  CALCIUM 9.0 9.1 9.2   GFR: Estimated Creatinine Clearance: 30.2 mL/min (by C-G formula based on SCr of 1 mg/dL). Liver Function Tests:  Recent Labs Lab 04/17/16 1611  AST 36  ALT 21  ALKPHOS 154*  BILITOT 1.4*  PROT 6.8  ALBUMIN 2.7*    Recent Labs Lab 04/17/16 1611  LIPASE 30   No results for input(s): AMMONIA in the last 168 hours. Coagulation Profile:  Recent Labs Lab 04/17/16 2116 04/18/16 0856 04/19/16 0221  INR 3.64 3.35 2.54   Cardiac Enzymes:  Recent Labs Lab 04/17/16 2116 04/18/16 0233 04/18/16 0856  TROPONINI <0.03 <0.03 <0.03   Thyroid Function Tests:  Recent Labs  04/17/16 1540 04/17/16 1611  TSH <0.010*  --   FREET4  --  3.31*   Anemia Panel: No results for input(s): VITAMINB12, FOLATE, FERRITIN, TIBC, IRON, RETICCTPCT in the last 72 hours. Urine analysis:    Component Value Date/Time   COLORURINE YELLOW 01/27/2016 1253   APPEARANCEUR CLEAR 01/27/2016 1253   LABSPEC 1.011 01/27/2016 1253   PHURINE 6.5 01/27/2016 1253   GLUCOSEU NEGATIVE 01/27/2016 1253   HGBUR NEGATIVE 01/27/2016 1253   BILIRUBINUR NEGATIVE 01/27/2016 1253   KETONESUR NEGATIVE 01/27/2016 1253   PROTEINUR NEGATIVE 01/27/2016 1253   UROBILINOGEN 0.2 10/03/2014 1657   NITRITE NEGATIVE 01/27/2016 1253   LEUKOCYTESUR NEGATIVE 01/27/2016 1253   Sepsis Labs: Invalid input(s): PROCALCITONIN, LACTICIDVEN  No results found for this or any previous visit (from the past 240 hour(s)).    Radiology Studies: Dg Chest Portable 1 View  Result Date: 04/17/2016 CLINICAL DATA:  Shortness of breath. EXAM: PORTABLE CHEST 1 VIEW COMPARISON:  Radiograph of  January 27, 2016. FINDINGS: Stable cardiomegaly. Atherosclerosis of thoracic aorta is noted. Increased bilateral lung opacities are noted, right greater than left. This is concerning for edema or pneumonia or atelectasis with associated pleural effusions. No pneumothorax is noted. Bony thorax is unremarkable. IMPRESSION: Aortic atherosclerosis. Increased bilateral lung opacities are noted concerning for worsening edema, pneumonia or atelectasis with associated pleural effusions, right greater than left. Electronically Signed   By: Lupita RaiderJames  Green Jr, M.D.   On: 04/17/2016 17:36     Scheduled Meds: . digoxin  0.125 mg Oral Daily  . ipratropium-albuterol  3 mL Nebulization BID  . loratadine  10 mg Oral Daily  . magnesium oxide  200 mg Oral Daily  . methimazole  5 mg Oral BID  . metolazone  2.5 mg Oral QPC breakfast  . metoprolol tartrate  25 mg Oral BID  . pantoprazole  40 mg Oral Daily  . potassium chloride  10 mEq Intravenous Q1 Hr x 5  . sodium chloride flush  3 mL Intravenous Q12H  . warfarin  2 mg Oral ONCE-1800  . Warfarin - Pharmacist Dosing Inpatient   Does not apply q1800   Continuous Infusions: . furosemide (LASIX) infusion 10 mg/hr (04/18/16 1659)     Pamella Pert, MD, PhD [facility] Pager [phone]  If 7PM-7AM, please contact night-coverage [email or web address] Password [email or web address] 04/19/2016, 11:11 AM

## 2016-04-19 NOTE — Progress Notes (Signed)
Palliative:  Received call back from Diamond Thomas's daughter and plan to meet with family and patient 10/20 1300. Thank you for this consult.   Yong ChannelAlicia Cyan Clippinger, NP Palliative Medicine Team Pager # [phone] (M-F 8a-5p) Team Phone # [phone] (Nights/Weekends)

## 2016-04-19 NOTE — Clinical Social Work Note (Signed)
CSW acknowledges SNF consult. Awaiting PT eval.  Elick Aguilera, CSW [phone]  

## 2016-04-19 NOTE — Progress Notes (Signed)
Patient Name: Diamond Thomas Date of Encounter: 04/19/2016  Primary Cardiologist: Dr. Kristeen MissHarding  Hospital Problem List     Principal Problem:   Anasarca Active Problems:   Essential hypertension   Chronic atrial fibrillation [facility](HCC):  CHA2DS2-VASc Score 5, On Warfarin   Palliative care encounter   Acute on chronic diastolic CHF (congestive heart failure) (HCC)   Acute on chronic congestive heart failure (HCC)   Moderate to severe pulmonary hypertension    Subjective   Complains of IV site pain. Updated nurse. Breathing stable.   Inpatient Medications    Scheduled Meds: . digoxin  0.125 mg Oral Daily  . ipratropium-albuterol  3 mL Nebulization BID  . loratadine  10 mg Oral Daily  . magnesium oxide  200 mg Oral Daily  . methimazole  5 mg Oral BID  . metolazone  2.5 mg Oral QPC breakfast  . metoprolol tartrate  25 mg Oral BID  . pantoprazole  40 mg Oral Daily  . potassium chloride  10 mEq Intravenous Q1 Hr x 5  . sodium chloride flush  3 mL Intravenous Q12H  . warfarin  2 mg Oral ONCE-1800  . Warfarin - Pharmacist Dosing Inpatient   Does not apply q1800   Continuous Infusions: . furosemide (LASIX) infusion 10 mg/hr (04/18/16 1659)   PRN Meds: sodium chloride, acetaminophen, guaiFENesin, menthol-cetylpyridinium, ondansetron (ZOFRAN) IV, phenol, sodium chloride flush   Vital Signs    Vitals:   04/18/16 2131 04/19/16 0024 04/19/16 0100 04/19/16 0428  BP:    (!) 122/49  Pulse:  74  72  Resp:  18  20  Temp:    97.6 F (36.4 C)  TempSrc:    Oral  SpO2: 97% 97% 97% 99%  Weight:    119 lb 12.8 oz (54.3 kg)  Height:        Intake/Output Summary (Last 24 hours) at 04/19/16 1117 Last data filed at 04/19/16 0944  Gross per 24 hour  Intake              100 ml  Output             3125 ml  Net            -3025 ml   Filed Weights   04/17/16 2052 04/18/16 0558 04/19/16 0428  Weight: 124 lb 11.2 oz (56.6 kg) 124 lb 11.2 oz (56.6 kg) 119 lb 12.8 oz (54.3 kg)     Physical Exam   GEN: Thin, frail and ill appearing female in no acute distress. Nasal cannula on HEENT: Grossly normal.  Neck: Supple, no JVD, carotid bruits, or masses. Cardiac: Ir IR , systolic  murmurs, rubs, or gallops. No clubbing, cyanosis. 2+ BL LE edema upto knee with venous statis.  Radials/DP/PT 2+ and equal bilaterally. Respiratory:  Respirations regular and unlabored, clear to auscultation bilaterally. GI: Soft, nontender, nondistended, BS + x 4. MS: no deformity or atrophy. Skin: warm and dry, no rash. Neuro:  Strength and sensation are intact. Psych: AAOx3.  Normal affect.  Labs    CBC  Recent Labs  04/17/16 1611 04/19/16 0221  WBC 7.2 7.1  NEUTROABS 5.0  --   HGB 10.9* 11.2*  HCT 33.8* 34.7*  MCV 81.1 80.7  PLT 218 224   Basic Metabolic Panel  Recent Labs  04/18/16 0233 04/19/16 0221  NA 140 141  K 3.3* 2.8*  CL 100* 99*  CO2 31 31  GLUCOSE 121* 106*  BUN 52* 49*  CREATININE 0.95 1.00  CALCIUM 9.1 9.2   Liver Function Tests  Recent Labs  04/17/16 1611  AST 36  ALT 21  ALKPHOS 154*  BILITOT 1.4*  PROT 6.8  ALBUMIN 2.7*    Recent Labs  04/17/16 1611  LIPASE 30   Cardiac Enzymes  Recent Labs  04/17/16 2116 04/18/16 0233 04/18/16 0856  TROPONINI <0.03 <0.03 <0.03   BNP Invalid input(s): POCBNP D-Dimer No results for input(s): DDIMER in the last 72 hours. Hemoglobin A1C No results for input(s): HGBA1C in the last 72 hours. Fasting Lipid Panel No results for input(s): CHOL, HDL, LDLCALC, TRIG, CHOLHDL, LDLDIRECT in the last 72 hours. Thyroid Function Tests  Recent Labs  04/17/16 1540  TSH <0.010*    Telemetry    afib at rate of 60-70s  ECG    N/A  Radiology    Dg Chest Portable 1 View  Result Date: 04/17/2016 CLINICAL DATA:  Shortness of breath. EXAM: PORTABLE CHEST 1 VIEW COMPARISON:  Radiograph of January 27, 2016. FINDINGS: Stable cardiomegaly. Atherosclerosis of thoracic aorta is noted. Increased  bilateral lung opacities are noted, right greater than left. This is concerning for edema or pneumonia or atelectasis with associated pleural effusions. No pneumothorax is noted. Bony thorax is unremarkable. IMPRESSION: Aortic atherosclerosis. Increased bilateral lung opacities are noted concerning for worsening edema, pneumonia or atelectasis with associated pleural effusions, right greater than left. Electronically Signed   By: Lupita Raider, M.D.   On: 04/17/2016 17:36    Cardiac Studies   None this admission  Patient Profile     82 y.o.femalewith PMH of chronic afib on coumadin, chronic diastolic HF, HTN pulmonary HTN and HLD who presented with increasing dyspnea and increased edema.  Assessment & Plan    1. Acute on Chronic diastolic CHF: known to have significant chronic lower extremity edema that has been difficult to manage in the outpatient setting. BNP of 1670. Placed on IV lasix 80mg  BID on admission with little UOP thus far, along with metolazone 2.5mg  daily. Discontinued metolazone and started on lasix drip at 10mg  /hr with excellent UOP. Net I & O negative 2.8L/3.7L. Still volume overloaded. Continue at current rate. On supplemental K for low potassium.    2. Chronic AF:  Rate is controlled on metoprolol and digoxin.  On chronic anticoagulation with coumadin.  -- This patients CHA2DS2-VASc Score and unadjusted Ischemic Stroke Rate (% per year) is equal to 7.2 % stroke rate/year from a score of 5 Above score calculated as 1 point each if present [CHF, HTN, DM, Vascular=MI/PAD/Aortic Plaque, Age if 65-74, or Female] Above score calculated as 2 points each if present [Age > 75, or Stroke/TIA/TE] -- TSH was noted <0.010 with free T4 of 3.31 likely contributing to worsening HF. On methimazole.   3. Pulmonary HTN: Denies any invasive evaluation. Pending palliative consult.   4. HTN: Stable on current regimen.   5. Hypokalemia: While on lasix drip. On supplement.    Signed, Manson Passey, PA  04/19/2016, 11:17 AM

## 2016-04-19 NOTE — Progress Notes (Signed)
Palliative Medicine consult noted. Due to high referral volume, there may be a delay seeing this patient. Please call the Palliative Medicine Team office at [phone] if recommendations are needed in the interim.  Thank you for inviting us to see this patient.  Margret ChanceMelanie G. Jayvyn Haselton, RN, BSN, [facility] Center[facility] 04/19/2016 11:06 AM Cell [phone] 8:00-4:00 Monday-Friday Office [phone]

## 2016-04-20 DIAGNOSIS — L8990 Pressure ulcer of unspecified site, unspecified stage: Secondary | ICD-10-CM | POA: Insufficient documentation

## 2016-04-20 LAB — CBC
HCT: 35 % — ABNORMAL LOW (ref 36.0–46.0)
HEMOGLOBIN: 11.2 g/dL — AB (ref 12.0–15.0)
MCH: 26 pg (ref 26.0–34.0)
MCHC: 32 g/dL (ref 30.0–36.0)
MCV: 81.2 fL (ref 78.0–100.0)
Platelets: 219 10*3/uL (ref 150–400)
RBC: 4.31 MIL/uL (ref 3.87–5.11)
RDW: 18.6 % — ABNORMAL HIGH (ref 11.5–15.5)
WBC: 8.2 10*3/uL (ref 4.0–10.5)

## 2016-04-20 LAB — BASIC METABOLIC PANEL
ANION GAP: 13 (ref 5–15)
Anion gap: 10 (ref 5–15)
BUN: 48 mg/dL — AB (ref 6–20)
BUN: 46 mg/dL — AB (ref 6–20)
CHLORIDE: 95 mmol/L — AB (ref 101–111)
CHLORIDE: 99 mmol/L — AB (ref 101–111)
CO2: 35 mmol/L — ABNORMAL HIGH (ref 22–32)
CO2: 27 mmol/L (ref 22–32)
CREATININE: 0.99 mg/dL (ref 0.44–1.00)
Calcium: 9.2 mg/dL (ref 8.9–10.3)
Calcium: 8.7 mg/dL — ABNORMAL LOW (ref 8.9–10.3)
Creatinine, Ser: 0.94 mg/dL (ref 0.44–1.00)
GFR calc Af Amer: 57 mL/min — ABNORMAL LOW (ref >60)
GFR calc Af Amer: >60 mL/min (ref >60)
GFR, EST NON AFRICAN AMERICAN: 49 mL/min — AB (ref >60)
GFR, EST NON AFRICAN AMERICAN: 52 mL/min — AB (ref >60)
GLUCOSE: 117 mg/dL — AB (ref 65–99)
GLUCOSE: 128 mg/dL — AB (ref 65–99)
POTASSIUM: 2.8 mmol/L — AB (ref 3.5–5.1)
POTASSIUM: 3.1 mmol/L — AB (ref 3.5–5.1)
SODIUM: 140 mmol/L (ref 135–145)
Sodium: 139 mmol/L (ref 135–145)

## 2016-04-20 LAB — PROTIME-INR
INR: 2.42
PROTHROMBIN TIME: 26.8 s — AB (ref 11.4–15.2)

## 2016-04-20 MED ORDER — POTASSIUM CHLORIDE 20 MEQ PO PACK
40.0000 meq | PACK | Freq: Two times a day (BID) | ORAL | Status: DC
  Administered 2016-04-20 – 2016-04-21 (×3): 40 meq via ORAL
  Filled 2016-04-20 (×4): qty 2

## 2016-04-20 MED ORDER — POTASSIUM CHLORIDE CRYS ER 10 MEQ PO TBCR: 40.0000 meq | EXTENDED_RELEASE_TABLET | Freq: Two times a day (BID) | ORAL | Status: DC

## 2016-04-20 MED ORDER — WARFARIN SODIUM 2 MG PO TABS
2.0000 mg | ORAL_TABLET | Freq: Once | ORAL | Status: AC
  Administered 2016-04-20: 2 mg via ORAL
  Filled 2016-04-20: qty 1

## 2016-04-20 MED ORDER — POTASSIUM CHLORIDE 10 MEQ/100ML IV SOLN
10.0000 meq | INTRAVENOUS | Status: AC
  Administered 2016-04-20 (×2): 10 meq via INTRAVENOUS
  Filled 2016-04-20 (×2): qty 100

## 2016-04-20 MED ORDER — POTASSIUM CHLORIDE 20 MEQ/15ML (10%) PO SOLN
40.0000 meq | Freq: Two times a day (BID) | ORAL | Status: DC
  Administered 2016-04-20: 40 meq via ORAL
  Filled 2016-04-20: qty 30

## 2016-04-20 NOTE — Progress Notes (Signed)
ANTICOAGULATION CONSULT NOTE Pharmacy Consult for Coumadin Indication: atrial fibrillation  Allergies  Allergen Reactions  . Pravachol [Pravastatin Sodium] Other (See Comments)    Myalgias  . Latex Itching    Patient Measurements: Height: 5\' 2"  (157.5 cm) Weight: 112 lb (50.8 kg) IBW/kg (Calculated) : 50.1  Vital Signs: Temp: 98.2 F (36.8 C) (10/20 0500) Temp Source: Axillary (10/20 0500) BP: 119/59 (10/20 0500) Pulse Rate: 79 (10/20 0500)  Labs:  Recent Labs  04/17/16 1611  04/17/16 2116 04/18/16 0233 04/18/16 0856 04/19/16 0221 04/20/16 0413  HGB 10.9*  --   --   --   --  11.2* 11.2*  HCT 33.8*  --   --   --   --  34.7* 35.0*  PLT 218  --   --   --   --  224 219  LABPROT  --   < > 37.1*  --  34.7* 27.8* 26.8*  INR  --   < > 3.64  --  3.35 2.54 2.42  CREATININE 0.95  --   --  0.95  --  1.00 0.99  TROPONINI  --   --  <0.03 <0.03 <0.03  --   --   < > = values in this interval not displayed.  Estimated Creatinine Clearance: 30.5 mL/min (by C-G formula based on SCr of 0.99 mg/dL).   Assessment: 82 yo female here with leg swelling and noted with HF.  PMH: rHF, CAD, Afib on warfarin  Anticoag: on coumadin PTA for afib -INR= 2.42  Home dose = 2 mg daily  Nephro: SCr 0.99  Heme/Onc: H&H 11.2/35, Plt 219  Goal of Therapy:  INR 2-3 Monitor platelets by anticoagulation protocol: Yes   Plan:  -Coumadin 2 mg po x 1 -Daily PT/INR  Isaac BlissMichael Kunal Levario, PharmD, BCPS, [facility]view[facility]CCCP Clinical Pharmacist Pager [phone] 04/20/2016 10:38 AM

## 2016-04-20 NOTE — Consult Note (Signed)
Consultation Note Date: 04/20/2016   Patient Name: Diamond Thomas  DOB: Oct 10, 1924  MRN: [id number]  Age / Sex: 82 y.o., female  PCP: Merrilee Seashore, MD Referring Physician: Caren Griffins, MD  Reason for Consultation: Establishing goals of care, Hospice Evaluation and Psychosocial/spiritual support  HPI/Patient Profile: 82 y.o. female  with past medical history of Acute on chronic right heart failure, hypertension, atrial fib, hyperthyroidism, severe pulmonary hypertension admitted on 04/17/2016 with worsening edema and shortness of breath. Patient is hypokalemic with a potassium level of 2.8. She is now on a Lasix drip and showing some improvement in terms of decreased lower extremity edema. Her TSH is less than 0.010. She is on Tapazole. Patient was being by speech and her swallow appears okay at present although she does appear to cough after eating and especially is having trouble taking pills. Patient lives in her own home with 2 patent caregivers as well as her daughter and son alternating care..   Clinical Assessment and Goals of Care: Met with patient's daughter Helene Kelp today. She is able to verbalize a sharp functional decline with more hospitalizations over this past year. She shares with me that her father died from heart failure but he had a much longer and slower disease trajectory. She does recognize how frail her mother is and that she would not be a candidate for aggressive measures. However her mother has told her in the past that she would want to have CPR including chest compressions and defibrillation and she is reluctant to change her to a DO NOT RESUSCITATE. She states her brother, with whom she shares   healthcare decision making, would be in favor of a DO NOT RESUSCITATE. Patient is so weak at this point she is unable to bear any weight even to put it to a bedside commode. Daughter shares  that she is been approached about skilled nursing facility level of care for rehabilitation but is reluctant to pursue this. She states they have done this in the past but also she recognizes her mother has limited capability for rehabilitation  We did talk about hospice. I did share that I felt that she would meet her Medicare hospice benefit is extra support in the home. Patient's daughter states that she and her brother were interested in hospice as they thought that hospice provided around-the-clock caregivers which I stated they do not. We did talk however about the benefits they do offer such as 24 7 on call capability and extra support in the home  Patient's son, Marbeth Smedley and patient's daughter, Corky Sox, share healthcare power of attorney. Patient can participate to a limited degree and healthcare decision making at this point    [place] full code for now Plan is for palliative medicine to meet with the patient individually on 04/21/2016 then to reengage family at 2 PM for further goals of care discussion specifically CODE STATUS as well as hospice in the home As noted, daughter is reluctant to override a previous conversation  she had with her mother when her mother reported that she would want CPR. Daughter does recognize that her mother would not be a good candidate for this and it would not improve her overall condition but is struggling with this previous conversation and feeling as though she did not on her wishes Code Status/Advance Care Planning:  Full code    Symptom Management:   Dyspnea: Continue to with targeted pulmonary treatments, Lasix  Pain: Continue with Tylenol as needed  Palliative Prophylaxis:   Aspiration, Bowel Regimen, Delirium Protocol, Eye Care, Frequent Pain Assessment, Oral Care and Turn Reposition  Additional Recommendations (Limitations, Scope, Preferences):  Full Scope Treatment  Psycho-social/Spiritual:    Desire for further Chaplaincy support:no  Additional Recommendations: Education on Hospice  Prognosis:   < 6 months in the setting of end-stage diastolic heart failure, pulmonary hypertension, chronic atrial flutter, hypothyroidism, sharp functional decline over the past 6 months as evidenced by poor by mouth intake worsening edema and weakness. Her albumin level is 2.7; her TSH is less than 0.010  Discharge Planning: To Be Determined      Primary Diagnoses: Present on Admission: . Chronic atrial fibrillation [facility]):  CHA2DS2-VASc Score 5, On Warfarin . Anasarca . Essential hypertension . Acute on chronic diastolic CHF (congestive heart failure) ([place])   I have reviewed the medical record, interviewed the patient and family, and examined the patient. The following aspects are pertinent.  Past Medical History:  Diagnosis Date  . Arthritis    "knees mostly" (04/17/2016)  . CHF (congestive heart failure) ([place])   . Chronic atrial fibrillation (HCC)    controlled on metoprolol and anticoagulated with Coumadin(Dr Ramachandran) .  Marland Kitchen H/O echocardiogram 09/20/2009   EF greater than 55%, no wall motion, bilateral atrial dilation.  Mild aortic sclerosis with no stenosis  . History of nuclear stress test 09/20/2009   Myoview : meduim sized, moderate intensity perfusion defect in the basal inferior wall with moderate reversibility thought to be High Risk scan; deferred invasive evaluation based on patient wishes.  . Hyperlipemia   . Hypertension   . Renal insufficiency 02/09/2016  . Stroke [facility])    "mini stroke ~ 10 yr ago; no problems now" (04/17/2016)   Social History   Social History  . Marital status: Widowed    Spouse name: N/A  . Number of children: N/A  . Years of education: N/A   Social History Main Topics  . Smoking status: Never Smoker  . Smokeless tobacco: Never Used  . Alcohol use No  . Drug use: No  . Sexual activity: No   Other Topics Concern  . None    Social History Narrative   She widowed ,2 children ,1 grandchild . 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 No tobacco use . No alcohol use. No chewing tobacco    Family History  Problem Relation Age of Onset  . Hypertension Mother   . Stomach cancer Father   . Stroke Neg Hx   . Diabetes Neg Hx   . Heart disease Neg Hx   . Heart attack Neg Hx    Scheduled Meds: . digoxin  0.125 mg Oral Daily  . ipratropium-albuterol  3 mL Nebulization BID  . loratadine  10 mg Oral Daily  . magnesium oxide  200 mg Oral Daily  . methimazole  5 mg Oral BID  . metolazone  2.5 mg Oral QPC breakfast  . metoprolol tartrate  25 mg Oral BID  . pantoprazole  40 mg  Oral Daily  . potassium chloride  40 mEq Oral BID  . sodium chloride flush  3 mL Intravenous Q12H  . warfarin  2 mg Oral ONCE-1800  . Warfarin - Pharmacist Dosing Inpatient   Does not apply q1800   Continuous Infusions: . furosemide (LASIX) infusion 10 mg/hr (04/19/16 2336)   PRN Meds:.sodium chloride, acetaminophen, guaiFENesin, menthol-cetylpyridinium, ondansetron (ZOFRAN) IV, phenol, sodium chloride flush Medications Prior to Admission:  Prior to Admission medications   Medication Sig Start Date End Date Taking? Authorizing Provider  Acetaminophen (TYLENOL EXTRA STRENGTH PO) Take 500 mg by mouth daily as needed (PAIN).    Yes Historical Provider, MD  Calcium Carb-Cholecalciferol (CALCIUM 600 + D PO) Take 1 tablet by mouth 2 (two) times daily.   Yes Historical Provider, MD  digoxin (LANOXIN) 0.125 MG tablet Take 1 tablet (0.125 mg total) by mouth daily. 03/02/16  Yes Leonie Man, MD  furosemide (LASIX) 80 MG tablet Take 1 tablet (80 mg total) by mouth 2 (two) times daily. 02/17/16  Yes Almyra Deforest, PA  loratadine (CLARITIN) 10 MG tablet Take 10 mg by mouth daily as needed for allergies.    Yes Historical Provider, MD  Magnesium 250 MG TABS Take 1 tablet by mouth daily.   Yes Historical Provider, MD  metolazone (ZAROXOLYN) 2.5 MG  tablet Take 1 tablet (2.5 mg total) by mouth daily. Monday THROUGH Friday - 30 minutes before morning LASIX Dose 03/02/16 05/31/16 Yes Leonie Man, MD  metoprolol tartrate (LOPRESSOR) 25 MG tablet Take 1 tablet (25 mg total) by mouth 2 (two) times daily. 02/03/16  Yes Arbutus Leas, NP  NITROSTAT 0.4 MG SL tablet TAKE 1 TABLET UNDER TONGUE EVERY 5 MINUTES AS NEEDED FOR CHEST PAIN 10/12/14  Yes Troy Sine, MD  potassium chloride SA (K-DUR,KLOR-CON) 20 MEQ tablet Take 40 mEq by mouth 2 (two) times daily.   Yes Historical Provider, MD  warfarin (COUMADIN) 2 MG tablet Take 2 mg by mouth every morning.   Yes Historical Provider, MD  Omega-3 Fatty Acids (FISH OIL) 1000 MG CAPS Take 1 capsule by mouth 2 (two) times daily.    Historical Provider, MD  OXYGEN Inhale 2 L into the lungs continuous. DX: Hypoxia    Historical Provider, MD   Allergies  Allergen Reactions  . Pravachol [Pravastatin Sodium] Other (See Comments)    Myalgias  . Latex Itching   Review of Systems  Unable to perform ROS: Mental status change    Physical Exam  Constitutional:  Cachectic Anasarca; 3+ edema to hip level  HENT:  Temporal wasting  Neck: Normal range of motion.  Pulmonary/Chest: Effort normal.  Abdominal: Soft.  Musculoskeletal: She exhibits edema.  Neurological:  Somnolent  Skin: Skin is warm and dry.  Venous stasis skin color changes  Psychiatric:  Unable to test  Nursing note and vitals reviewed.   Vital Signs: BP 115/75 (BP Location: Left Arm)   Pulse 85   Temp 98 F (36.7 C) (Oral)   Resp 17   Ht 5' 2"  (1.575 m)   Wt 50.8 kg (112 lb)   SpO2 95%   BMI 20.49 kg/m  Pain Assessment: No/denies pain   Pain Score: 0-No pain   SpO2: SpO2: 95 % O2 Device:SpO2: 95 % O2 Flow Rate: .O2 Flow Rate (L/min): 2 L/min  IO: Intake/output summary:  Intake/Output Summary (Last 24 hours) at 04/20/16 1450 Last data filed at 04/20/16 1300  Gross per 24 hour  Intake  1736.83 ml  Output              2400 ml  Net          -663.17 ml    LBM: Last BM Date: 04/19/16 Baseline Weight: Weight: 56.6 kg (124 lb 11.2 oz) Most recent weight: Weight: 50.8 kg (112 lb)     Palliative Assessment/Data:   Flowsheet Rows   Flowsheet Row Most Recent Value  Intake Tab  Referral Department  Hospitalist  Unit at Time of Referral  Cardiac/Telemetry Unit  Palliative Care Primary Diagnosis  Cardiac  Date Notified  04/19/16  Palliative Care Type  New Palliative care  Reason for referral  Clarify Goals of Care, Counsel Regarding Hospice  Date first seen by Palliative Care  04/20/16  # of days Palliative referral response time  1 Day(s)  Clinical Assessment  Palliative Performance Scale Score  30%  Pain Max last 24 hours  Not able to report  Pain Min Last 24 hours  Not able to report  Dyspnea Max Last 24 Hours  Not able to report  Dyspnea Min Last 24 hours  Not able to report  Nausea Max Last 24 Hours  Not able to report  Nausea Min Last 24 Hours  Not able to report  Anxiety Max Last 24 Hours  Not able to report  Anxiety Min Last 24 Hours  Not able to report  Other Max Last 24 Hours  Not able to report  Psychosocial & Spiritual Assessment  Palliative Care Outcomes  Patient/Family meeting held?  Yes  Who was at the meeting?  pt and dtr  Palliative Care follow-up planned  Yes, Facility      Time In: 1300 Time Out: 1415 Time Total: 75 min Greater than 50%  of this time was spent counseling and coordinating care related to the above assessment and plan. Staffed with Dr. Cruzita Lederer  Signed by: Dory Horn, NP   Please contact Palliative Medicine Team phone at [phone] for questions and concerns.  For individual provider: See Shea Evans

## 2016-04-20 NOTE — Care Management Note (Addendum)
Case Management Note Donn PieriniKristi Jyll Tomaro RN, BSN Unit 2W-Case Manager [phone]  Patient Details  Name: Ayesha MohairHazel O Snarski MRN: [id number] Date of Birth: 12/28/1924  Subjective/Objective:  Pt admitted with HF                   Action/Plan: PTA pt [facility] at home- [facility]- meeting planned with family for 10/20- CM to follow for d/c needs- CSW also following for possible placement needs- PT/OT evals pending  Expected Discharge Date:                  Expected Discharge Plan:  Skilled Nursing Facility  In-House Referral:  Clinical Social Work  Discharge planning Services  CM Consult  Post Acute Care Choice:    Choice offered to:     DME Arranged:    DME Agency:     HH Arranged:    HH Agency:     Status of Service:  In process, will continue to follow  If discussed at Long Length of Stay Meetings, dates discussed:    Additional Comments:  Darrold SpanWebster, Arvo Ealy Hall, RN 04/20/2016, 10:30 AM

## 2016-04-20 NOTE — Progress Notes (Signed)
Patient Name: Diamond Thomas Date of Encounter: 04/20/2016  Primary Cardiologist: Ward Chattersave Harding, MD  Hospital Problem List     Principal Problem:   Anasarca Active Problems:   Essential hypertension   Chronic atrial fibrillation [facility](HCC):  CHA2DS2-VASc Score 5, On Warfarin   Palliative care encounter   Acute on chronic diastolic CHF (congestive heart failure) (HCC)   Acute on chronic congestive heart failure (HCC)   Moderate to severe pulmonary hypertension   Pressure injury of skin     Subjective   Notes that her shortness of breath is improving.   Inpatient Medications    Scheduled Meds: . digoxin  0.125 mg Oral Daily  . ipratropium-albuterol  3 mL Nebulization BID  . loratadine  10 mg Oral Daily  . magnesium oxide  200 mg Oral Daily  . methimazole  5 mg Oral BID  . metolazone  2.5 mg Oral QPC breakfast  . metoprolol tartrate  25 mg Oral BID  . pantoprazole  40 mg Oral Daily  . sodium chloride flush  3 mL Intravenous Q12H  . warfarin  2 mg Oral ONCE-1800  . Warfarin - Pharmacist Dosing Inpatient   Does not apply q1800   Continuous Infusions: . furosemide (LASIX) infusion 10 mg/hr (04/19/16 2336)   PRN Meds: sodium chloride, acetaminophen, guaiFENesin, menthol-cetylpyridinium, ondansetron (ZOFRAN) IV, phenol, sodium chloride flush   Vital Signs    Vitals:   04/19/16 1300 04/19/16 2041 04/19/16 2220 04/20/16 0500  BP: (!) 110/45 (!) 118/57  (!) 119/59  Pulse: 68 72 70 79  Resp: 16 18 18 18   Temp:  97.7 F (36.5 C)  98.2 F (36.8 C)  TempSrc:  Oral  Axillary  SpO2: 99% 97%  94%  Weight:    50.8 kg (112 lb)  Height:        Intake/Output Summary (Last 24 hours) at 04/20/16 1126 Last data filed at 04/20/16 1029  Gross per 24 hour  Intake          1496.83 ml  Output             2100 ml  Net          -603.17 ml   Filed Weights   04/18/16 0558 04/19/16 0428 04/20/16 0500  Weight: 56.6 kg (124 lb 11.2 oz) 54.3 kg (119 lb 12.8 oz) 50.8 kg (112 lb)     Physical Exam    GEN: Frail and chronically ill-appearing. In no acute distress.  HEENT: Grossly normal.  Neck: Supple, JVP to above the tragus.  No carotid bruits, or masses. Cardiac: Irregularly irregular.  III/VI holosystolic murmur at the LLSB.  +RV heave.  No clubbing, cyanosis.  2+ pitting edema to the upper tibia bilaterally.  Radials/DP/PT 2+ and equal bilaterally.  Respiratory:  Respirations regular and unlabored, clear to auscultation bilaterally. GI: Soft, nontender, nondistended, BS + x 4. MS: no deformity or atrophy. Skin: warm and dry, no rash. Neuro:  Strength and sensation are intact. Psych: AAOx3.  Normal affect.  Labs    CBC  Recent Labs  04/17/16 1611 04/19/16 0221 04/20/16 0413  WBC 7.2 7.1 8.2  NEUTROABS 5.0  --   --   HGB 10.9* 11.2* 11.2*  HCT 33.8* 34.7* 35.0*  MCV 81.1 80.7 81.2  PLT 218 224 219   Basic Metabolic Panel  Recent Labs  04/19/16 0221 04/20/16 0413  NA 141 140  K 2.8* 2.8*  CL 99* 95*  CO2 31 35*  GLUCOSE 106* 117*  BUN 49* 48*  CREATININE 1.00 0.99  CALCIUM 9.2 9.2   Liver Function Tests  Recent Labs  04/17/16 1611  AST 36  ALT 21  ALKPHOS 154*  BILITOT 1.4*  PROT 6.8  ALBUMIN 2.7*    Recent Labs  04/17/16 1611  LIPASE 30   Cardiac Enzymes  Recent Labs  04/17/16 2116 04/18/16 0233 04/18/16 0856  TROPONINI <0.03 <0.03 <0.03   BNP Invalid input(s): POCBNP D-Dimer No results for input(s): DDIMER in the last 72 hours. Hemoglobin A1C No results for input(s): HGBA1C in the last 72 hours. Fasting Lipid Panel No results for input(s): CHOL, HDL, LDLCALC, TRIG, CHOLHDL, LDLDIRECT in the last 72 hours. Thyroid Function Tests  Recent Labs  04/17/16 1540  TSH <0.010*    Telemetry     Atrial fibrillation.  Rates <100 bpm.  Pause up to 1.7s.  - Personally Reviewed  ECG    N/A  Radiology    No results found.  Cardiac Studies   Echo 01/27/16: Study Conclusions  - Left ventricle: The  cavity size was normal. Wall thickness was   normal. Systolic function was normal. The estimated ejection   fraction was in the range of 60% to 65%. Wall motion was normal;   there were no regional wall motion abnormalities. The study is   not technically sufficient to allow evaluation of LV diastolic   function. - Ventricular septum: The contour showed diastolic flattening and   systolic flattening. - Aortic valve: Mildly calcified annulus. Trileaflet; mildly   calcified leaflets. - Mitral valve: Calcified annulus. There was mild regurgitation. - Left atrium: The atrium was moderately dilated. - Right ventricle: The cavity size was moderately to severely   dilated. Systolic function was mildly to moderately reduced. - Right atrium: The atrium was severely dilated. Central venous   pressure (est): 15 mm Hg. - Atrial septum: No defect or patent foramen ovale was identified. - Tricuspid valve: There was moderate regurgitation. - Pulmonary arteries: Systolic pressure was severely increased. PA   peak pressure: 70 mm Hg (S). - Pericardium, extracardiac: A trivial pericardial effusion was   identified.  Impressions:  - Normal LV wall thickness with LVEF 60-65%. Indeterminate   diastolic function in the setting of atrial fibrillation. LV   septal flattening noted consistent with RV pressure and volume   overload. Moderate left atrial enlargement. MAC with mild mitral   regurgitation. Mildly sclerotic aortic valve. Moderate to severe   RV enlargement with moderately decreased contraction. Severe   right atrial enlargement with elevated estimated CVP. At least   moderate tricuspid regurgitation noted with evidence of severe   pulmonary hypertension and PASP estimated 70 mmHg. Trivial   pericardial effusion.  Patient Profile     Ms. Diamond Thomas is an 11F with severe pulmonary hypertension, chronic atrial fibrillation, chronic diastolic heart failure, hypertension, and hyperlipidemia who  presents with increased shortness of breath and volume overload  Assessment & Plan    # Severe pulmonary hypertension: # Right heart failure: # Acute on chronic diastolic heart failure: Ms. Diamond Thomas remains significantly volume overloaded.  She has responded to diuresis with IV lasix and her renal function is stable.  Diuresis is slowing down.  This may be due to hypokalemia.  We will replete potassium with 40 mEq bid and two 10 mEq doses IV.  Continue lasix.  Continue metolazone 2.5mg  daily.  Agree with Palliative Care discussion.  There is a bit of a disconnect between her desire to limit invasive work up  and management of her severe pulmonary hypertension, yet a desire to "do everything" in a code situation.  # Permanent atrial fibrillation: Continue metoprolol and replete potassium as above.  Warfarin for anticoagulation.  She was started on methimazole for hyperthyroidism this admission.  -- This patients CHA2DS2-VASc Score and unadjusted Ischemic Stroke Rate (% per year) is equal to 7.2 % stroke rate/year from a score of 5Above score calculated as 1 point each if present [CHF, HTN, DM, Vascular=MI/PAD/Aortic Plaque, Age if 60-74, orFemale]Above score calculated as 2 points each if present [Age >75, or Stroke/TIA/TE]  # Hypertension: Blood pressure well-controlled.    Signed, Chilton Si, MD  04/20/2016, 11:26 AM

## 2016-04-20 NOTE — Progress Notes (Signed)
PROGRESS NOTE  Diamond MohairHazel O Nicolosi ZOX:[id number] DOB: 11/17/1924 DOA: 04/17/2016 PCP: Georgianne FickAMACHANDRAN,AJITH, MD   LOS: 2 days   Brief Narrative: 82 y.o. female with medical history significant of R heart failure, long standing h/o A.Fib on coumadin and rate controlled since 2011, admitted on 10/17 with significant fluid overload and shortness of breath.  Assessment & Plan: Principal Problem:   Anasarca Active Problems:   Essential hypertension   Chronic atrial fibrillation (HCC):  CHA2DS2-VASc Score 5, On Warfarin   Palliative care encounter   Acute on chronic diastolic CHF (congestive heart failure) (HCC)   Acute on chronic congestive heart failure (HCC)   Moderate to severe pulmonary hypertension   Pressure injury of skin    Acute on chronic right heart failure - continue Lasix per cardiology - weight 124 >> 119>>112, she is net negative 4.8 L - renal function is stable, continue diuresis. Replace K  Acute on chronic confusion / suspect underlying dementia - mental status fluctuates - suspect contributing is also her underlying hyperthyroidism   Hypertension - She is on metoprolol, continue  Hypokalemia - due to Lasix, replete  Dysphagia / recurrent aspiration - Patient on PPI at home, daughter is aware that she occasionally has coughing spells when she eats and has difficulties with pills at home. - SLP recommended regular diet   Atrial fibrillation - patient's CHA2DS2-VASc Score for Stroke Risk is greater than 2, anticoagulate with Coumadin per pharmacy - Continue metoprolol and digoxin  Goals of care discussion - Discussed with daughter over the phone, patient has expressed her wishes in the past and reiterated full CODE STATUS at this time. - Daughter aware of risk of aspiration  Hyperthyroidism - Patient's TSH was undetectable and free T4 was elevated to 3.3 - Supposed to get thyroid scan as an outpatient - Her A. fib is currently well controlled - start  methimazole. Will need repeat TSH/Free T4 as an outpatient   Chronic hypoxic respiratory failure - Continue home oxygen 2 L nasal cannula, respiratory status stable after aspiration without need for increased oxygen requirements    DVT prophylaxis: Coumadin Code Status: Full code Family Communication: Discussed with daughter over the phone Disposition Plan: TBD  Consultants:   Cardiology  Procedures:   None   Antimicrobials:  None    Subjective: - more confused again, asking how she got here and who brought her and why  Objective: Vitals:   04/19/16 2041 04/19/16 2220 04/20/16 0500 04/20/16 1426  BP: (!) 118/57  (!) 119/59 115/75  Pulse: 72 70 79 85  Resp: 18 18 18 17   Temp: 97.7 F (36.5 C)  98.2 F (36.8 C) 98 F (36.7 C)  TempSrc: Oral  Axillary Oral  SpO2: 97%  94% 95%  Weight:   50.8 kg (112 lb)   Height:        Intake/Output Summary (Last 24 hours) at 04/20/16 1443 Last data filed at 04/20/16 1300  Gross per 24 hour  Intake          1736.83 ml  Output             2400 ml  Net          -663.17 ml   Filed Weights   04/18/16 0558 04/19/16 0428 04/20/16 0500  Weight: 56.6 kg (124 lb 11.2 oz) 54.3 kg (119 lb 12.8 oz) 50.8 kg (112 lb)    Examination: Constitutional: NAD, ill appearing Vitals:   04/19/16 2041 04/19/16 2220 04/20/16 0500 04/20/16 1426  BP: Marland Kitchen(!)  118/57  (!) 119/59 115/75  Pulse: 72 70 79 85  Resp: 18 18 18 17   Temp: 97.7 F (36.5 C)  98.2 F (36.8 C) 98 F (36.7 C)  TempSrc: Oral  Axillary Oral  SpO2: 97%  94% 95%  Weight:   50.8 kg (112 lb)   Height:       Eyes: PERRL, lids and conjunctivae normal Neck: normal, supple, no masses, no thyromegaly Respiratory: Overall Decreased breath sounds, no wheezing  Cardiovascular: Irregular, no MRG, 2+ pitting edema  Abdomen: no tenderness. Bowel sounds positive.  Skin: no rashes, lesions, ulcers. No induration Neurologic: non focal    Data Reviewed: I have personally reviewed following  labs and imaging studies  CBC:  Recent Labs Lab 04/17/16 1611 04/19/16 0221 04/20/16 0413  WBC 7.2 7.1 8.2  NEUTROABS 5.0  --   --   HGB 10.9* 11.2* 11.2*  HCT 33.8* 34.7* 35.0*  MCV 81.1 80.7 81.2  PLT 218 224 219   Basic Metabolic Panel:  Recent Labs Lab 04/17/16 1611 04/18/16 0233 04/19/16 0221 04/20/16 0413  NA 142 140 141 140  K 3.5 3.3* 2.8* 2.8*  CL 103 100* 99* 95*  CO2 31 31 31  35*  GLUCOSE 120* 121* 106* 117*  BUN 49* 52* 49* 48*  CREATININE 0.95 0.95 1.00 0.99  CALCIUM 9.0 9.1 9.2 9.2   GFR: Estimated Creatinine Clearance: 30.5 mL/min (by C-G formula based on SCr of 0.99 mg/dL). Liver Function Tests:  Recent Labs Lab 04/17/16 1611  AST 36  ALT 21  ALKPHOS 154*  BILITOT 1.4*  PROT 6.8  ALBUMIN 2.7*    Recent Labs Lab 04/17/16 1611  LIPASE 30   No results for input(s): AMMONIA in the last 168 hours. Coagulation Profile:  Recent Labs Lab 04/17/16 2116 04/18/16 0856 04/19/16 0221 04/20/16 0413  INR 3.64 3.35 2.54 2.42   Cardiac Enzymes:  Recent Labs Lab 04/17/16 2116 04/18/16 0233 04/18/16 0856  TROPONINI <0.03 <0.03 <0.03   Thyroid Function Tests:  Recent Labs  04/17/16 1540 04/17/16 1611  TSH <0.010*  --   FREET4  --  3.31*   Anemia Panel: No results for input(s): VITAMINB12, FOLATE, FERRITIN, TIBC, IRON, RETICCTPCT in the last 72 hours. Urine analysis:    Component Value Date/Time   COLORURINE YELLOW 01/27/2016 1253   APPEARANCEUR CLEAR 01/27/2016 1253   LABSPEC 1.011 01/27/2016 1253   PHURINE 6.5 01/27/2016 1253   GLUCOSEU NEGATIVE 01/27/2016 1253   HGBUR NEGATIVE 01/27/2016 1253   BILIRUBINUR NEGATIVE 01/27/2016 1253   KETONESUR NEGATIVE 01/27/2016 1253   PROTEINUR NEGATIVE 01/27/2016 1253   UROBILINOGEN 0.2 10/03/2014 1657   NITRITE NEGATIVE 01/27/2016 1253   LEUKOCYTESUR NEGATIVE 01/27/2016 1253   Sepsis Labs: Invalid input(s): PROCALCITONIN, LACTICIDVEN  No results found for this or any previous  visit (from the past 240 hour(s)).    Radiology Studies: No results found.   Scheduled Meds: . digoxin  0.125 mg Oral Daily  . ipratropium-albuterol  3 mL Nebulization BID  . loratadine  10 mg Oral Daily  . magnesium oxide  200 mg Oral Daily  . methimazole  5 mg Oral BID  . metolazone  2.5 mg Oral QPC breakfast  . metoprolol tartrate  25 mg Oral BID  . pantoprazole  40 mg Oral Daily  . potassium chloride  40 mEq Oral BID  . sodium chloride flush  3 mL Intravenous Q12H  . warfarin  2 mg Oral ONCE-1800  . Warfarin - Pharmacist Dosing Inpatient  Does not apply q1800   Continuous Infusions: . furosemide (LASIX) infusion 10 mg/hr (04/19/16 2336)     Pamella Pert, MD, PhD [facility] Pager [phone]  If 7PM-7AM, please contact night-coverage [email or web address] Password [email or web address] 04/20/2016, 2:43 PM

## 2016-04-20 NOTE — Evaluation (Signed)
Physical Therapy Evaluation Patient Details Name: Diamond MohairHazel O Thomas MRN: [id number] DOB: 08/29/1924 Today's Date: 04/20/2016   History of Present Illness  82 yo female with planned palliative care consult was admitted for SOB and anasarca, CHF exacerbation with refusal of catheterization.  PMHx:  pulm HTN, a-fib, CHF, HTN, TIA  Clinical Impression  Pt is up to stand with sudden drop in sats, but also very weak and afraid she is going to fall.  Her plan is to get into SNF for more extended care, and is quite medically fragile which may make placement a need.  Follow acutely to progress light activity as her condition tolerates.    Follow Up Recommendations SNF    Equipment Recommendations  None recommended by PT    Recommendations for Other Services Rehab consult     Precautions / Restrictions Precautions Precautions: Fall (telemetry) Restrictions Weight Bearing Restrictions: No      Mobility  Bed Mobility               General bed mobility comments: up when PT entered, nursing reports one person to get OOB  Transfers Overall transfer level: Needs assistance Equipment used: Rolling walker (2 wheeled);1 person hand held assist Transfers: Sit to/from Stand Sit to Stand: Max assist         General transfer comment: pt cannot stand without full cues for hand placement (O2 sats down to 83% with standing from 93% at rest)  Ambulation/Gait             General Gait Details: unable  Stairs            Wheelchair Mobility    Modified Rankin (Stroke Patients Only)       Balance Overall balance assessment: Needs assistance Sitting-balance support: Feet supported Sitting balance-Leahy Scale: Fair     Standing balance support: Bilateral upper extremity supported Standing balance-Leahy Scale: Poor                               Pertinent Vitals/Pain Pain Assessment: No/denies pain    Home Living Family/patient expects to be discharged to::  Unsure Living Arrangements:  (pt does not know the location)               Additional Comments: per chart daughter lives near her    Prior Function Level of Independence: Needs assistance   Gait / Transfers Assistance Needed: uses RW in home, WC to go out, at times needs assist for transfers  ADL's / Homemaking Assistance Needed: daughter assists with sponge bath        Hand Dominance        Extremity/Trunk Assessment   Upper Extremity Assessment: Generalized weakness (muscle wasting)           Lower Extremity Assessment: Generalized weakness (significant LE edema)      Cervical / Trunk Assessment: Kyphotic  Communication   Communication: HOH  Cognition Arousal/Alertness: Lethargic Behavior During Therapy: Flat affect Overall Cognitive Status: No family/caregiver present to determine baseline cognitive functioning       Memory: Decreased short-term memory;Decreased recall of precautions              General Comments      Exercises     Assessment/Plan    PT Assessment Patient needs continued PT services  PT Problem List Decreased strength;Decreased range of motion;Decreased activity tolerance;Decreased balance;Decreased mobility;Decreased coordination;Decreased cognition;Decreased knowledge of use of DME;Decreased safety awareness;Cardiopulmonary status limiting  activity;Decreased skin integrity          PT Treatment Interventions DME instruction;Gait training;Functional mobility training;Therapeutic activities;Therapeutic exercise;Balance training;Neuromuscular re-education;Patient/family education    PT Goals (Current goals can be found in the Care Plan section)  Acute Rehab PT Goals Patient Stated Goal: none stated PT Goal Formulation: Patient unable to participate in goal setting Time For Goal Achievement: 05/04/16 Potential to Achieve Goals: Fair    Frequency Min 2X/week   Barriers to discharge  (2 person assist is needed)       Co-evaluation               End of Session Equipment Utilized During Treatment: Oxygen Activity Tolerance: Patient limited by fatigue;Patient limited by lethargy;Other (comment);Treatment limited secondary to medical complications (Comment) (weakness and O2 sats dropping rapidly upon standing) Patient left: in chair;with call bell/phone within reach;with nursing/sitter in room Nurse Communication: Mobility status         Time: 4132-4401 PT Time Calculation (min) (ACUTE ONLY): 24 min   Charges:   PT Evaluation $PT Eval Low Complexity: 1 Procedure PT Treatments $Therapeutic Activity: 8-22 mins   PT G Codes:        Ivar Drape 24-Apr-2016, 11:28 AM    Samul Dada, PT MS Acute Rehab Dept. Number: [facility] [id number] and [facility] [phone]

## 2016-04-21 DIAGNOSIS — E876 Hypokalemia: Secondary | ICD-10-CM

## 2016-04-21 LAB — CBC
HEMATOCRIT: 34.5 % — AB (ref 36.0–46.0)
HEMOGLOBIN: 10.9 g/dL — AB (ref 12.0–15.0)
MCH: 25.9 pg — ABNORMAL LOW (ref 26.0–34.0)
MCHC: 31.6 g/dL (ref 30.0–36.0)
MCV: 81.9 fL (ref 78.0–100.0)
Platelets: 200 10*3/uL (ref 150–400)
RBC: 4.21 MIL/uL (ref 3.87–5.11)
RDW: 18.7 % — ABNORMAL HIGH (ref 11.5–15.5)
WBC: 7 10*3/uL (ref 4.0–10.5)

## 2016-04-21 LAB — COMPREHENSIVE METABOLIC PANEL WITH GFR
ALT: 18 U/L (ref 14–54)
AST: 36 U/L (ref 15–41)
Albumin: 2.7 g/dL — ABNORMAL LOW (ref 3.5–5.0)
Alkaline Phosphatase: 139 U/L — ABNORMAL HIGH (ref 38–126)
Anion gap: 11 (ref 5–15)
BUN: 50 mg/dL — ABNORMAL HIGH (ref 6–20)
CO2: 32 mmol/L (ref 22–32)
Calcium: 8.7 mg/dL — ABNORMAL LOW (ref 8.9–10.3)
Chloride: 95 mmol/L — ABNORMAL LOW (ref 101–111)
Creatinine, Ser: 0.94 mg/dL (ref 0.44–1.00)
GFR calc Af Amer: >60 mL/min
GFR calc non Af Amer: 52 mL/min — ABNORMAL LOW
Glucose, Bld: 100 mg/dL — ABNORMAL HIGH (ref 65–99)
Potassium: 3.2 mmol/L — ABNORMAL LOW (ref 3.5–5.1)
Sodium: 138 mmol/L (ref 135–145)
Total Bilirubin: 1.4 mg/dL — ABNORMAL HIGH (ref 0.3–1.2)
Total Protein: 6.3 g/dL — ABNORMAL LOW (ref 6.5–8.1)

## 2016-04-21 LAB — PROTIME-INR
INR: 2.93
PROTHROMBIN TIME: 31.2 s — AB (ref 11.4–15.2)

## 2016-04-21 LAB — MAGNESIUM: Magnesium: 2 mg/dL (ref 1.7–2.4)

## 2016-04-21 MED ORDER — POTASSIUM CHLORIDE CRYS ER 20 MEQ PO TBCR
30.0000 meq | EXTENDED_RELEASE_TABLET | Freq: Once | ORAL | Status: DC
  Filled 2016-04-21: qty 1

## 2016-04-21 MED ORDER — POTASSIUM CHLORIDE 20 MEQ PO PACK
20.0000 meq | PACK | Freq: Once | ORAL | Status: AC
  Administered 2016-04-21: 20 meq via ORAL
  Filled 2016-04-21: qty 1

## 2016-04-21 MED ORDER — WHITE PETROLATUM GEL
Status: AC
  Administered 2016-04-21: 04:00:00
  Filled 2016-04-21: qty 1

## 2016-04-21 MED ORDER — MORPHINE SULFATE (CONCENTRATE) 10 MG/0.5ML PO SOLN: 5.0000 mg | ORAL | Status: DC | PRN

## 2016-04-21 MED ORDER — IPRATROPIUM-ALBUTEROL 0.5-2.5 (3) MG/3ML IN SOLN: 3.0000 mL | Freq: Four times a day (QID) | RESPIRATORY_TRACT | Status: DC | PRN

## 2016-04-21 MED ORDER — WARFARIN SODIUM 1 MG PO TABS
0.5000 mg | ORAL_TABLET | Freq: Once | ORAL | Status: AC
  Administered 2016-04-21: 0.5 mg via ORAL
  Filled 2016-04-21: qty 1

## 2016-04-21 NOTE — Progress Notes (Signed)
PROGRESS NOTE  Diamond Thomas ZOX:[id number] DOB: 11/18/1924 DOA: 04/17/2016 PCP: Georgianne Fick, MD   LOS: 3 days   Brief Narrative: 82 y.o. female with medical history significant of R heart failure, long standing h/o A.Fib on coumadin and rate controlled since 2011, admitted on 10/17 with significant fluid overload and shortness of breath.  Assessment & Plan: Principal Problem:   Anasarca Active Problems:   Essential hypertension   Chronic atrial fibrillation (HCC):  CHA2DS2-VASc Score 5, On Warfarin   Palliative care encounter   Acute on chronic diastolic CHF (congestive heart failure) (HCC)   Acute on chronic congestive heart failure (HCC)   Moderate to severe pulmonary hypertension   Pressure injury of skin    Acute on chronic right heart failure - continue Lasix per cardiology, increase drip today - weight 124 >> 119>>112 >> 112, she is net negative 3.8 L, increase Lasix today  - renal function is stable, continue diuresis. Replace K  Acute on chronic confusion / suspect underlying dementia - mental status fluctuates - suspect contributing is also her underlying hyperthyroidism   Hypertension - She is on metoprolol, continue  Hypokalemia - due to Lasix, replete again today  Dysphagia / recurrent aspiration - Patient on PPI at home, daughter is aware that she occasionally has coughing spells when she eats and has difficulties with pills at home. - SLP recommended regular diet   Atrial fibrillation - patient's CHA2DS2-VASc Score for Stroke Risk is greater than 2, anticoagulate with Coumadin per pharmacy - Continue metoprolol and digoxin  Goals of care discussion - Discussed with daughter over the phone, patient has expressed her wishes in the past and reiterated full CODE STATUS at this time. - Daughter aware of risk of aspiration - palliative discussions ongoing  Hyperthyroidism - Patient's TSH was undetectable and free T4 was elevated to 3.3 - Supposed  to get thyroid scan as an outpatient - Her A. fib is currently well controlled - started methimazole. Will need repeat TSH/Free T4 as an outpatient   Chronic hypoxic respiratory failure - Continue home oxygen 2 L nasal cannula, respiratory status stable after aspiration without need for increased oxygen requirements    DVT prophylaxis: Coumadin Code Status: Full code Family Communication: no family at bedside today  Disposition Plan: TBD  Consultants:   Cardiology  Procedures:   None   Antimicrobials:  None    Subjective: - no complaints this morning, denies shortness of breath, no chest pain. Alert to person and place but not to time  Objective: Vitals:   04/20/16 1426 04/20/16 2137 04/20/16 2225 04/21/16 0500  BP: 115/75  (!) 116/49 (!) 104/57  Pulse: 85  68 71  Resp: 17  18   Temp: 98 F (36.7 C)  97.4 F (36.3 C) 97.8 F (36.6 C)  TempSrc: Oral  Oral Oral  SpO2: 95% 94% 97% 98%  Weight:    51 kg (112 lb 8 oz)  Height:        Intake/Output Summary (Last 24 hours) at 04/21/16 1138 Last data filed at 04/21/16 0616  Gross per 24 hour  Intake             1580 ml  Output              500 ml  Net             1080 ml   Filed Weights   04/19/16 0428 04/20/16 0500 04/21/16 0500  Weight: 54.3 kg (119 lb 12.8 oz) 50.8  kg (112 lb) 51 kg (112 lb 8 oz)    Examination: Constitutional: NAD, ill appearing Vitals:   04/20/16 1426 04/20/16 2137 04/20/16 2225 04/21/16 0500  BP: 115/75  (!) 116/49 (!) 104/57  Pulse: 85  68 71  Resp: 17  18   Temp: 98 F (36.7 C)  97.4 F (36.3 C) 97.8 F (36.6 C)  TempSrc: Oral  Oral Oral  SpO2: 95% 94% 97% 98%  Weight:    51 kg (112 lb 8 oz)  Height:       Eyes: PERRL, lids and conjunctivae normal Neck: normal, supple, no masses, no thyromegaly Respiratory: Overall Decreased breath sounds, no wheezing  Cardiovascular: Irregular, no MRG, 2+ pitting edema  Abdomen: no tenderness. Bowel sounds positive.  Skin: no rashes,  lesions, ulcers. No induration Neurologic: non focal    Data Reviewed: I have personally reviewed following labs and imaging studies  CBC:  Recent Labs Lab 04/17/16 1611 04/19/16 0221 04/20/16 0413 04/21/16 0436  WBC 7.2 7.1 8.2 7.0  NEUTROABS 5.0  --   --   --   HGB 10.9* 11.2* 11.2* 10.9*  HCT 33.8* 34.7* 35.0* 34.5*  MCV 81.1 80.7 81.2 81.9  PLT 218 224 219 200   Basic Metabolic Panel:  Recent Labs Lab 04/18/16 0233 04/19/16 0221 04/20/16 0413 04/20/16 1603 04/21/16 0436 04/21/16 1021  NA 140 141 140 139 138  --   K 3.3* 2.8* 2.8* 3.1* 3.2*  --   CL 100* 99* 95* 99* 95*  --   CO2 31 31 35* 27 32  --   GLUCOSE 121* 106* 117* 128* 100*  --   BUN 52* 49* 48* 46* 50*  --   CREATININE 0.95 1.00 0.99 0.94 0.94  --   CALCIUM 9.1 9.2 9.2 8.7* 8.7*  --   MG  --   --   --   --   --  2.0   GFR: Estimated Creatinine Clearance: 32.1 mL/min (by C-G formula based on SCr of 0.94 mg/dL). Liver Function Tests:  Recent Labs Lab 04/17/16 1611 04/21/16 0436  AST 36 36  ALT 21 18  ALKPHOS 154* 139*  BILITOT 1.4* 1.4*  PROT 6.8 6.3*  ALBUMIN 2.7* 2.7*    Recent Labs Lab 04/17/16 1611  LIPASE 30   No results for input(s): AMMONIA in the last 168 hours. Coagulation Profile:  Recent Labs Lab 04/17/16 2116 04/18/16 0856 04/19/16 0221 04/20/16 0413 04/21/16 0436  INR 3.64 3.35 2.54 2.42 2.93   Cardiac Enzymes:  Recent Labs Lab 04/17/16 2116 04/18/16 0233 04/18/16 0856  TROPONINI <0.03 <0.03 <0.03   Thyroid Function Tests: No results for input(s): TSH, T4TOTAL, FREET4, T3FREE, THYROIDAB in the last 72 hours. Anemia Panel: No results for input(s): VITAMINB12, FOLATE, FERRITIN, TIBC, IRON, RETICCTPCT in the last 72 hours. Urine analysis:    Component Value Date/Time   COLORURINE YELLOW 01/27/2016 1253   APPEARANCEUR CLEAR 01/27/2016 1253   LABSPEC 1.011 01/27/2016 1253   PHURINE 6.5 01/27/2016 1253   GLUCOSEU NEGATIVE 01/27/2016 1253   HGBUR NEGATIVE  01/27/2016 1253   BILIRUBINUR NEGATIVE 01/27/2016 1253   KETONESUR NEGATIVE 01/27/2016 1253   PROTEINUR NEGATIVE 01/27/2016 1253   UROBILINOGEN 0.2 10/03/2014 1657   NITRITE NEGATIVE 01/27/2016 1253   LEUKOCYTESUR NEGATIVE 01/27/2016 1253   Sepsis Labs: Invalid input(s): PROCALCITONIN, LACTICIDVEN  No results found for this or any previous visit (from the past 240 hour(s)).    Radiology Studies: No results found.   Scheduled Meds: .  digoxin  0.125 mg Oral Daily  . loratadine  10 mg Oral Daily  . magnesium oxide  200 mg Oral Daily  . methimazole  5 mg Oral BID  . metolazone  2.5 mg Oral QPC breakfast  . metoprolol tartrate  25 mg Oral BID  . pantoprazole  40 mg Oral Daily  . potassium chloride  40 mEq Oral BID  . sodium chloride flush  3 mL Intravenous Q12H  . Warfarin - Pharmacist Dosing Inpatient   Does not apply q1800   Continuous Infusions: . furosemide (LASIX) infusion 15 mg/hr (04/21/16 1043)     Pamella Pert, MD, PhD [facility] Pager [phone]  If 7PM-7AM, please contact night-coverage [email or web address] Password [email or web address] 04/21/2016, 11:38 AM

## 2016-04-21 NOTE — Progress Notes (Signed)
Pt blood pressure is 105/35 MD notified , advise to hold the metoprolol for tonight

## 2016-04-21 NOTE — Progress Notes (Signed)
ANTICOAGULATION CONSULT NOTE - Follow Up Consult  Pharmacy Consult for Coumadin Indication: atrial fibrillation  Allergies  Allergen Reactions  . Pravachol [Pravastatin Sodium] Other (See Comments)    Myalgias  . Latex Itching    Patient Measurements: Height: 5\' 2"  (157.5 cm) Weight: 112 lb 8 oz (51 kg) IBW/kg (Calculated) : 50.1  Vital Signs: Temp: 97.8 F (36.6 C) (10/21 0500) Temp Source: Oral (10/21 0500) BP: 104/57 (10/21 0500) Pulse Rate: 71 (10/21 0500)  Labs:  Recent Labs  04/19/16 0221 04/20/16 0413 04/20/16 1603 04/21/16 0436  HGB 11.2* 11.2*  --  10.9*  HCT 34.7* 35.0*  --  34.5*  PLT 224 219  --  200  LABPROT 27.8* 26.8*  --  31.2*  INR 2.54 2.42  --  2.93  CREATININE 1.00 0.99 0.94 0.94    Estimated Creatinine Clearance: 32.1 mL/min (by C-G formula based on SCr of 0.94 mg/dL).   Assessment:   82 yr old female continues on Coumadin as prior to admission for afib.   INR therapeutic at 2.93, but has trended up after Coumadin 2 mg daily x 2 days.    Home Coumadin regimen: 2 mg daily, but INR supratherapeutic on admission (3.64), and held for 2 days.  Now on Methimazole, which can effect Coumadin needs.  Low PO intake, which can also increase sensitivity to Coumadin.  Goal of Therapy:  INR 2-3 Monitor platelets by anticoagulation protocol: Yes   Plan:   Decrease Coumadin to 0.5 mg x 1 today.  Daily PT/INR.  Dennie FettersEgan, Josselin Gaulin Donovan, RPh Pager: [phone] 04/21/2016,1:28 PM

## 2016-04-21 NOTE — Progress Notes (Signed)
Daily Progress Note   Patient Name: Diamond Thomas       Date: 04/21/2016 DOB: Oct 19, 1924  Age: 82 y.o. MRN#: [id number] Attending Physician: Caren Griffins, MD Primary Care Physician: Merrilee Seashore, MD Admit Date: 04/17/2016  Reason for Consultation/Follow-up: Establishing goals of care, Hospice Evaluation and Withdrawal of life-sustaining treatment  Subjective: Patient is somnolent but does awaken to voice. She can verbalize brief needs such as needing to go to the restroom. She can bear weight to pivot to the bedside commode with the aid of holding onto a walker. She is unable to walk any distance at all. She has a dry nonproductive cough. She is still taking by mouth medicines but with difficulty. She is eating but this is likely from her hyperthyroidism. Her albumin level is 2.7. Anasarca still present up to waistline  Length of Stay: 3  Current Medications: Scheduled Meds:  . digoxin  0.125 mg Oral Daily  . loratadine  10 mg Oral Daily  . magnesium oxide  200 mg Oral Daily  . methimazole  5 mg Oral BID  . metolazone  2.5 mg Oral QPC breakfast  . metoprolol tartrate  25 mg Oral BID  . pantoprazole  40 mg Oral Daily  . potassium chloride  40 mEq Oral BID  . sodium chloride flush  3 mL Intravenous Q12H  . warfarin  0.5 mg Oral ONCE-1800  . Warfarin - Pharmacist Dosing Inpatient   Does not apply q1800    Continuous Infusions: . furosemide (LASIX) infusion 15 mg/hr (04/21/16 1043)    PRN Meds: sodium chloride, acetaminophen, guaiFENesin, ipratropium-albuterol, menthol-cetylpyridinium, ondansetron (ZOFRAN) IV, phenol, sodium chloride flush  Physical Exam  Constitutional:  Cachetic, anasarca Temporal wasitng  HENT:  Head: Normocephalic and atraumatic.  Cardiovascular:    irrg  Pulmonary/Chest:  Increased work of breathing at rest  Abdominal: Soft.  Neurological:  somnolent Confused Short term memory deficits  Skin: Skin is warm and dry.  Psychiatric:  Affect constricted Short term memory deficits present concentration very poor  Nursing note and vitals reviewed.           Vital Signs: BP (!) 104/57 (BP Location: Right Arm)   Pulse 71   Temp 97.8 F (36.6 C) (Oral)   Resp 18   Ht 5' 2"  (1.575 m)   Wt 51 kg (  112 lb 8 oz)   SpO2 98%   BMI 20.58 kg/m  SpO2: SpO2: 98 % O2 Device: O2 Device: Nasal Cannula O2 Flow Rate: O2 Flow Rate (L/min): 2 L/min  Intake/output summary:  Intake/Output Summary (Last 24 hours) at 04/21/16 1614 Last data filed at 04/21/16 2694  Gross per 24 hour  Intake              960 ml  Output              300 ml  Net              660 ml   LBM: Last BM Date: 04/19/16 Baseline Weight: Weight: 56.6 kg (124 lb 11.2 oz) Most recent weight: Weight: 51 kg (112 lb 8 oz)       Palliative Assessment/Data:    Flowsheet Rows   Flowsheet Row Most Recent Value  Intake Tab  Referral Department  Hospitalist  Unit at Time of Referral  Cardiac/Telemetry Unit  Palliative Care Primary Diagnosis  Cardiac  Date Notified  04/19/16  Palliative Care Type  New Palliative care  Reason for referral  Clarify Goals of Care, Counsel Regarding Hospice  Date first seen by Palliative Care  04/20/16  # of days Palliative referral response time  1 Day(s)  Clinical Assessment  Palliative Performance Scale Score  30%  Pain Max last 24 hours  Not able to report  Pain Min Last 24 hours  Not able to report  Dyspnea Max Last 24 Hours  Not able to report  Dyspnea Min Last 24 hours  Not able to report  Nausea Max Last 24 Hours  Not able to report  Nausea Min Last 24 Hours  Not able to report  Anxiety Max Last 24 Hours  Not able to report  Anxiety Min Last 24 Hours  Not able to report  Other Max Last 24 Hours  Not able to report  Psychosocial  & Spiritual Assessment  Palliative Care Outcomes  Patient/Family meeting held?  Yes  Who was at the meeting?  pt and dtr  Palliative Care follow-up planned  Yes, Facility      Patient Active Problem List   Diagnosis Date Noted  . Pressure injury of skin 04/20/2016  . Acute on chronic congestive heart failure ([place])   . Moderate to severe pulmonary hypertension   . Acute on chronic diastolic CHF (congestive heart failure) ([place]) 04/17/2016  . Palliative care encounter 03/05/2016  . Medication management 03/02/2016  . Pulmonary artery hypertension 02/09/2016  . Renal insufficiency 02/09/2016  . Anasarca 01/26/2016  . Protein-calorie malnutrition ([place]) 01/26/2016  . Edema of both legs 07/21/2015  . Chronic atrial fibrillation [facility]):  CHA2DS2-VASc Score 5, On Warfarin   . Hyperlipemia   . Chronic anticoagulation - with warfarin 08/27/2012  . Essential hypertension 08/26/2012  . High risk NST 08/2009, pt refused diagnostic heart cath, EF >55% by echo 08/2009 08/26/2012  . Acid reflux disease 08/26/2012    Palliative Care Assessment & Plan   Patient Profile: Diamond Thomas is an 64F with severe pulmonary hypertension, chronic atrial fibrillation, chronic diastolic heart failure, hypertension, and hyperlipidemia who presents with increased shortness of breath and volume overload Patient has severe pulmonary hypertension and PE 8 S/P estimated at 70 mmHg. she has moderate left atrial enlargement and moderate to severe right ventricle enlargement with moderately decreased contraction. She has severe right atrial enlargement with elevated CVP Additionally patient has hypothyroidism with a TSH of less than 0.010 despite  being on Tapazole She has been on IV Lasix and has had some diuresis but still has 3-4+ lower extremity edema up to waist.   Assessment: I met today with patient's daughter again as well as her son. We reviewed clinical condition secondary to patient's severe acute on chronic  right heart failure, anasarca dysphagia with recurrent aspiration atrial fib as well as severe pulmonary hypertension. I did speak to patient individually about her CODE STATUS and she elected DO NOT RESUSCITATE. After further disease progression discussion I did prepare family that patient is at high risk for an acute event but barring this I felt like her prognosis was very limited and just days to weeks. She is quite somnolent with a fluctuating mental status. Her appetite is still good which might be driven by her hyperthyroidism although she is starting to have trouble swallowing. She sleeps most of the day and is now no longer ambulatory  Recommendations/Plan:  Despite limited prognosis family does want to take her home at least for a little while with hospice in the home. They're going to continue to provide care with private caregivers. I did prepare them that this may be a limited time. At home and they verbalized understanding. Family wishes to go to [facility] for end-of-life care and have patient not die in the home  Patient is now a DO NOT RESUSCITATE DO NOT INTUBATE  Goals of Care and Additional Recommendations:  Limitations on Scope of Treatment: Avoid Hospitalization, Initiate Comfort Feeding, No Artificial Feeding, No Blood Transfusions, No Chemotherapy, No Hemodialysis, No Radiation, No Surgical Procedures and No Tracheostomy  Code Status:    Code Status Orders        Start     Ordered   04/21/16 1559  Do not attempt resuscitation (DNR)  Continuous    Question Answer Comment  In the event of cardiac or respiratory ARREST Do not call a "code blue"   In the event of cardiac or respiratory ARREST Do not perform Intubation, CPR, defibrillation or ACLS   In the event of cardiac or respiratory ARREST Use medication by any route, position, wound care, and other measures to relive pain and suffering. May use oxygen, suction and manual treatment of airway obstruction as needed for  comfort.      04/21/16 1558    Code Status History    Date Active Date Inactive Code Status Order ID Comments User Context   04/17/2016  7:16 PM 04/21/2016  3:58 PM Full Code [id number]  Etta Quill, DO ED   01/26/2016  3:13 PM 02/03/2016  9:07 PM Full Code [id number]  Leonie Man, MD Inpatient       Prognosis:   < 4 weeks barring acute cardiopulmonary event; in the setting of acute on chronic right heart failure, severe pulmonary hypertension with a steep functional decline over the past 6 months, albumin 2.7, no longer ambulatory, hyperthyroidism with a TSH of 0.010  Discharge Planning:  Home with Hospice  Care plan was discussed with Dr. Letta Median  Thank you for allowing the Palliative Medicine Team to assist in the care of this patient.   Time In: 1400 Time Out: 1600 Total Time 120 min Prolonged Time Billed  yes       Greater than 50%  of this time was spent counseling and coordinating care related to the above assessment and plan.  Dory Horn, NP  Please contact Palliative Medicine Team phone at [phone] for questions and concerns.

## 2016-04-21 NOTE — Progress Notes (Signed)
Patient Name: Diamond Thomas Date of Encounter: 04/21/2016  Primary Cardiologist: Ward Chattersave Harding, MD  Hospital Problem List     Principal Problem:   Anasarca Active Problems:   Essential hypertension   Chronic atrial fibrillation [facility](HCC):  CHA2DS2-VASc Score 5, On Warfarin   Palliative care encounter   Acute on chronic diastolic CHF (congestive heart failure) (HCC)   Acute on chronic congestive heart failure (HCC)   Moderate to severe pulmonary hypertension   Pressure injury of skin     Subjective   Breathing has improved. Net positive overnight. Renal function stable. Remains persistently hypokalemic.   Inpatient Medications    Scheduled Meds: . digoxin  0.125 mg Oral Daily  . loratadine  10 mg Oral Daily  . magnesium oxide  200 mg Oral Daily  . methimazole  5 mg Oral BID  . metolazone  2.5 mg Oral QPC breakfast  . metoprolol tartrate  25 mg Oral BID  . pantoprazole  40 mg Oral Daily  . potassium chloride  40 mEq Oral BID  . sodium chloride flush  3 mL Intravenous Q12H  . Warfarin - Pharmacist Dosing Inpatient   Does not apply q1800   Continuous Infusions: . furosemide (LASIX) infusion 10 mg/hr (04/21/16 0221)   PRN Meds: sodium chloride, acetaminophen, guaiFENesin, ipratropium-albuterol, menthol-cetylpyridinium, ondansetron (ZOFRAN) IV, phenol, sodium chloride flush   Vital Signs    Vitals:   04/20/16 1426 04/20/16 2137 04/20/16 2225 04/21/16 0500  BP: 115/75  (!) 116/49 (!) 104/57  Pulse: 85  68 71  Resp: 17  18   Temp: 98 F (36.7 C)  97.4 F (36.3 C) 97.8 F (36.6 C)  TempSrc: Oral  Oral Oral  SpO2: 95% 94% 97% 98%  Weight:    112 lb 8 oz (51 kg)  Height:        Intake/Output Summary (Last 24 hours) at 04/21/16 0949 Last data filed at 04/21/16 0616  Gross per 24 hour  Intake             1580 ml  Output              950 ml  Net              630 ml   Filed Weights   04/19/16 0428 04/20/16 0500 04/21/16 0500  Weight: 119 lb 12.8 oz (54.3 kg) 112  lb (50.8 kg) 112 lb 8 oz (51 kg)    Physical Exam    GEN: Frail and chronically ill-appearing. In no acute distress.  HEENT: Grossly normal.  Neck: Supple, JVP to above the tragus.  No carotid bruits, or masses. Cardiac: Irregularly irregular.  III/VI holosystolic murmur at the LLSB.  +RV heave.  No clubbing, cyanosis.  2+ pitting edema to the upper tibia bilaterally.  Radials/DP/PT 2+ and equal bilaterally.  Respiratory:  Respirations regular and unlabored, clear to auscultation bilaterally. GI: Soft, nontender, nondistended, BS + x 4. MS: no deformity or atrophy. Skin: warm and dry, no rash. Neuro:  Strength and sensation are intact. Psych: AAOx3.  Normal affect.  Labs    CBC  Recent Labs  04/20/16 0413 04/21/16 0436  WBC 8.2 7.0  HGB 11.2* 10.9*  HCT 35.0* 34.5*  MCV 81.2 81.9  PLT 219 200   Basic Metabolic Panel  Recent Labs  04/20/16 1603 04/21/16 0436  NA 139 138  K 3.1* 3.2*  CL 99* 95*  CO2 27 32  GLUCOSE 128* 100*  BUN 46* 50*  CREATININE 0.94  0.94  CALCIUM 8.7* 8.7*   Liver Function Tests  Recent Labs  04/21/16 0436  AST 36  ALT 18  ALKPHOS 139*  BILITOT 1.4*  PROT 6.3*  ALBUMIN 2.7*   No results for input(s): LIPASE, AMYLASE in the last 72 hours. Cardiac Enzymes No results for input(s): CKTOTAL, CKMB, CKMBINDEX, TROPONINI in the last 72 hours. BNP Invalid input(s): POCBNP D-Dimer No results for input(s): DDIMER in the last 72 hours. Hemoglobin A1C No results for input(s): HGBA1C in the last 72 hours. Fasting Lipid Panel No results for input(s): CHOL, HDL, LDLCALC, TRIG, CHOLHDL, LDLDIRECT in the last 72 hours. Thyroid Function Tests No results for input(s): TSH, T4TOTAL, T3FREE, THYROIDAB in the last 72 hours.  Invalid input(s): FREET3  Telemetry     Atrial fibrillation.  Rates <100 bpm.  Pause up to 1.7s.  - Personally Reviewed  ECG    N/A  Radiology    No results found.  Cardiac Studies   Echo 01/27/16: Study  Conclusions  - Left ventricle: The cavity size was normal. Wall thickness was   normal. Systolic function was normal. The estimated ejection   fraction was in the range of 60% to 65%. Wall motion was normal;   there were no regional wall motion abnormalities. The study is   not technically sufficient to allow evaluation of LV diastolic   function. - Ventricular septum: The contour showed diastolic flattening and   systolic flattening. - Aortic valve: Mildly calcified annulus. Trileaflet; mildly   calcified leaflets. - Mitral valve: Calcified annulus. There was mild regurgitation. - Left atrium: The atrium was moderately dilated. - Right ventricle: The cavity size was moderately to severely   dilated. Systolic function was mildly to moderately reduced. - Right atrium: The atrium was severely dilated. Central venous   pressure (est): 15 mm Hg. - Atrial septum: No defect or patent foramen ovale was identified. - Tricuspid valve: There was moderate regurgitation. - Pulmonary arteries: Systolic pressure was severely increased. PA   peak pressure: 70 mm Hg (S). - Pericardium, extracardiac: A trivial pericardial effusion was   identified.  Impressions:  - Normal LV wall thickness with LVEF 60-65%. Indeterminate   diastolic function in the setting of atrial fibrillation. LV   septal flattening noted consistent with RV pressure and volume   overload. Moderate left atrial enlargement. MAC with mild mitral   regurgitation. Mildly sclerotic aortic valve. Moderate to severe   RV enlargement with moderately decreased contraction. Severe   right atrial enlargement with elevated estimated CVP. At least   moderate tricuspid regurgitation noted with evidence of severe   pulmonary hypertension and PASP estimated 70 mmHg. Trivial   pericardial effusion.  Patient Profile     Diamond Thomas is an 46F with severe pulmonary hypertension, chronic atrial fibrillation, chronic diastolic heart failure,  hypertension, and hyperlipidemia who presents with increased shortness of breath and volume overload  Assessment & Plan    # Severe pulmonary hypertension:  # Right heart failure: # Acute on chronic diastolic heart failure: Diamond Thomas remains significantly overloaded.  She has responded to diuresis with IV lasix and her renal function is stable.  Diuresis is slowing down. Creatinine stable. Increase lasix gtts to 15 mg/hr.  Continue metolazone 2.5mg  daily.  Agree with Palliative Care discussion - this will be re-addressed today.  #Hypokalemia: She is on potassium and magnesium replacements. Re-check magnesium today, may need to increased dose - typically use 400 mg BID.   # Permanent atrial fibrillation: Continue metoprolol  and replete potassium as above.  Warfarin for anticoagulation.  She was started on methimazole for hyperthyroidism this admission.  -- This patients CHA2DS2-VASc Score and unadjusted Ischemic Stroke Rate (% per year) is equal to 7.2 % stroke rate/year from a score of 5Above score calculated as 1 point each if present [CHF, HTN, DM, Vascular=MI/PAD/Aortic Plaque, Age if 52-74, orFemale]Above score calculated as 2 points each if present [Age >75, or Stroke/TIA/TE]  # Hypertension: Blood pressure well-controlled.   Chrystie Nose, MD, [facility] Attending Cardiologist [facility]  Chrystie Nose, MD  04/21/2016, 9:49 AM

## 2016-04-22 LAB — BASIC METABOLIC PANEL
ANION GAP: 12 (ref 5–15)
BUN: 53 mg/dL — ABNORMAL HIGH (ref 6–20)
CALCIUM: 8.9 mg/dL (ref 8.9–10.3)
CO2: 36 mmol/L — AB (ref 22–32)
Chloride: 93 mmol/L — ABNORMAL LOW (ref 101–111)
Creatinine, Ser: 0.92 mg/dL (ref 0.44–1.00)
GFR calc non Af Amer: 54 mL/min — ABNORMAL LOW (ref >60)
Glucose, Bld: 123 mg/dL — ABNORMAL HIGH (ref 65–99)
POTASSIUM: 3 mmol/L — AB (ref 3.5–5.1)
Sodium: 141 mmol/L (ref 135–145)

## 2016-04-22 LAB — PROTIME-INR
INR: 3.17
Prothrombin Time: 33.3 seconds — ABNORMAL HIGH (ref 11.4–15.2)

## 2016-04-22 MED ORDER — POTASSIUM CHLORIDE 20 MEQ PO PACK
40.0000 meq | PACK | Freq: Three times a day (TID) | ORAL | Status: DC
  Administered 2016-04-22 – 2016-04-25 (×8): 40 meq via ORAL
  Filled 2016-04-22 (×11): qty 2

## 2016-04-22 NOTE — Care Management Note (Signed)
Case Management Note  Patient Details  Name: Diamond Thomas MRN: [id number] Date of Birth: 1924-12-15  Subjective/Objective:                    Action/Plan:   Expected Discharge Date:                  Expected Discharge Plan:  Home w Hospice Care  In-House Referral:  Clinical Social Work  Discharge planning Services  CM Consult  Post Acute Care Choice:  Hospice Choice offered to:  Patient, Adult Children  DME Arranged:    DME Agency:     HH Arranged:    [place] Agency:  Hospice and [place] of Service:  Completed, signed off  If discussed at [employer] of Stay Meetings, dates discussed:    Additional Comments: CM met with pt and spoke with pt's daughter, [name] [phone] to confirm choice of Hospice agency; [name] confirms family's choice is Hospice and Palliative Care.  Referral called to [name], [name].  No other CM needs were communicated. [name], RN 04/22/2016, 12:46 PM

## 2016-04-22 NOTE — Progress Notes (Addendum)
PROGRESS NOTE  Diamond MohairHazel O Nanna WUJ:[id number] DOB: 12/26/1924 DOA: 04/17/2016 PCP: Georgianne FickAMACHANDRAN,AJITH, MD   LOS: 4 days   Brief Narrative: 82 y.o. female with medical history significant of R heart failure, long standing h/o A.Fib on coumadin and rate controlled since 2011, admitted on 10/17 with significant fluid overload and shortness of breath.  Assessment & Plan: Principal Problem:   Anasarca Active Problems:   Essential hypertension   Chronic atrial fibrillation (HCC):  CHA2DS2-VASc Score 5, On Warfarin   Palliative care encounter   Acute on chronic diastolic CHF (congestive heart failure) (HCC)   Acute on chronic congestive heart failure (HCC)   Moderate to severe pulmonary hypertension   Pressure injury of skin    Acute on chronic right heart failure - continue Lasix per cardiology - weight 124 >> 119>>112 >> 112 >> 114 - renal function is stable, continue diuresis. Replace K  Acute on chronic confusion / suspect underlying dementia - mental status fluctuates - suspect contributing is also her underlying hyperthyroidism   Hypertension - She is on metoprolol, continue  Hypokalemia - due to Lasix  Dysphagia / recurrent aspiration - Patient on PPI at home, daughter is aware that she occasionally has coughing spells when she eats and has difficulties with pills at home. - SLP recommended regular diet   Atrial fibrillation - patient's CHA2DS2-VASc Score for Stroke Risk is greater than 2, anticoagulate with Coumadin per pharmacy - Continue metoprolol and digoxin  Goals of care discussion - Discussed with daughter over the phone, patient has expressed her wishes in the past and reiterated full CODE STATUS at this time. - Daughter aware of risk of aspiration - palliative discussions ongoing  Hyperthyroidism - Patient's TSH was undetectable and free T4 was elevated to 3.3 - Supposed to get thyroid scan as an outpatient - Her A. fib is currently well controlled -  started methimazole. Will need repeat TSH/Free T4 as an outpatient   Chronic hypoxic respiratory failure - Continue home oxygen 2 L nasal cannula, respiratory status stable after aspiration without need for increased oxygen requirements  Severe protein calorie malnutrition   DVT prophylaxis: Coumadin Code Status: Full code Family Communication: no family at bedside today  Disposition Plan: TBD  Consultants:   Cardiology  Procedures:   None   Antimicrobials:  None    Subjective: - no specific complaints, no chest pain, no dyspnea  Objective: Vitals:   04/21/16 0500 04/21/16 1945 04/22/16 0440 04/22/16 0454  BP: (!) 104/57 (!) 105/35 (!) 111/48   Pulse: 71 63 82   Resp:  20 20   Temp: 97.8 F (36.6 C) 97.5 F (36.4 C) 98 F (36.7 C)   TempSrc: Oral Oral Oral   SpO2: 98% 97% 93%   Weight: 51 kg (112 lb 8 oz)   52 kg (114 lb 11.2 oz)  Height:        Intake/Output Summary (Last 24 hours) at 04/22/16 1041 Last data filed at 04/22/16 0859  Gross per 24 hour  Intake           227.75 ml  Output              701 ml  Net          -473.25 ml   Filed Weights   04/20/16 0500 04/21/16 0500 04/22/16 0454  Weight: 50.8 kg (112 lb) 51 kg (112 lb 8 oz) 52 kg (114 lb 11.2 oz)    Examination: Constitutional: NAD, ill appearing Vitals:   04/21/16  0500 04/21/16 1945 04/22/16 0440 04/22/16 0454  BP: (!) 104/57 (!) 105/35 (!) 111/48   Pulse: 71 63 82   Resp:  20 20   Temp: 97.8 F (36.6 C) 97.5 F (36.4 C) 98 F (36.7 C)   TempSrc: Oral Oral Oral   SpO2: 98% 97% 93%   Weight: 51 kg (112 lb 8 oz)   52 kg (114 lb 11.2 oz)  Height:       Eyes: PERRL, lids and conjunctivae normal Respiratory: Overall Decreased breath sounds, no wheezing  Cardiovascular: Irregular, no MRG, 2+ pitting edema    Data Reviewed: I have personally reviewed following labs and imaging studies  CBC:  Recent Labs Lab 04/17/16 1611 04/19/16 0221 04/20/16 0413 04/21/16 0436  WBC 7.2 7.1  8.2 7.0  NEUTROABS 5.0  --   --   --   HGB 10.9* 11.2* 11.2* 10.9*  HCT 33.8* 34.7* 35.0* 34.5*  MCV 81.1 80.7 81.2 81.9  PLT 218 224 219 200   Basic Metabolic Panel:  Recent Labs Lab 04/19/16 0221 04/20/16 0413 04/20/16 1603 04/21/16 0436 04/21/16 1021 04/22/16 0253  NA 141 140 139 138  --  141  K 2.8* 2.8* 3.1* 3.2*  --  3.0*  CL 99* 95* 99* 95*  --  93*  CO2 31 35* 27 32  --  36*  GLUCOSE 106* 117* 128* 100*  --  123*  BUN 49* 48* 46* 50*  --  53*  CREATININE 1.00 0.99 0.94 0.94  --  0.92  CALCIUM 9.2 9.2 8.7* 8.7*  --  8.9  MG  --   --   --   --  2.0  --    GFR: Estimated Creatinine Clearance: 32.8 mL/min (by C-G formula based on SCr of 0.92 mg/dL). Liver Function Tests:  Recent Labs Lab 04/17/16 1611 04/21/16 0436  AST 36 36  ALT 21 18  ALKPHOS 154* 139*  BILITOT 1.4* 1.4*  PROT 6.8 6.3*  ALBUMIN 2.7* 2.7*    Recent Labs Lab 04/17/16 1611  LIPASE 30   No results for input(s): AMMONIA in the last 168 hours. Coagulation Profile:  Recent Labs Lab 04/18/16 0856 04/19/16 0221 04/20/16 0413 04/21/16 0436 04/22/16 0253  INR 3.35 2.54 2.42 2.93 3.17   Cardiac Enzymes:  Recent Labs Lab 04/17/16 2116 04/18/16 0233 04/18/16 0856  TROPONINI <0.03 <0.03 <0.03   Thyroid Function Tests: No results for input(s): TSH, T4TOTAL, FREET4, T3FREE, THYROIDAB in the last 72 hours. Anemia Panel: No results for input(s): VITAMINB12, FOLATE, FERRITIN, TIBC, IRON, RETICCTPCT in the last 72 hours. Urine analysis:    Component Value Date/Time   COLORURINE YELLOW 01/27/2016 1253   APPEARANCEUR CLEAR 01/27/2016 1253   LABSPEC 1.011 01/27/2016 1253   PHURINE 6.5 01/27/2016 1253   GLUCOSEU NEGATIVE 01/27/2016 1253   HGBUR NEGATIVE 01/27/2016 1253   BILIRUBINUR NEGATIVE 01/27/2016 1253   KETONESUR NEGATIVE 01/27/2016 1253   PROTEINUR NEGATIVE 01/27/2016 1253   UROBILINOGEN 0.2 10/03/2014 1657   NITRITE NEGATIVE 01/27/2016 1253   LEUKOCYTESUR NEGATIVE  01/27/2016 1253   Sepsis Labs: Invalid input(s): PROCALCITONIN, LACTICIDVEN  No results found for this or any previous visit (from the past 240 hour(s)).    Radiology Studies: No results found.   Scheduled Meds: . digoxin  0.125 mg Oral Daily  . loratadine  10 mg Oral Daily  . magnesium oxide  200 mg Oral Daily  . methimazole  5 mg Oral BID  . metolazone  2.5 mg Oral QPC breakfast  .  metoprolol tartrate  25 mg Oral BID  . pantoprazole  40 mg Oral Daily  . potassium chloride  40 mEq Oral TID  . sodium chloride flush  3 mL Intravenous Q12H  . Warfarin - Pharmacist Dosing Inpatient   Does not apply q1800   Continuous Infusions: . furosemide (LASIX) infusion 15 mg/hr (04/21/16 2257)     Pamella Pert, MD, PhD [facility] Pager [phone]  If 7PM-7AM, please contact night-coverage [email or web address] Password [email or web address] 04/22/2016, 10:41 AM

## 2016-04-22 NOTE — Progress Notes (Signed)
Notified by Freddy JakschSarah Jeffries, CMRN of [facility] after [facility]. Chart and patient information currently under review to confirm hospice eligibility.  Spoke with patient at the bedside and daughter Jeryl Columbiaeresa Hinshaw [phone]) by phone to initiate education related to hospice philosophy, services and team approach to care. Daughter verbalized understanding of the information provided.   Per discussion plan is for discharge to home by ambulance at a date to be determined by hospitalist and cardiology.  Please send signed completed DNR form home with patient.   Patient will need prescriptions for discharge comfort medications.   DME needs discussed and daughter requested hospital bed with 1/2 rails  for delivery to the home tomorrow.   [facility] equipment manager Jewel Kizzie BaneHughes notified and will contact [facility] to arrange delivery to the home.   The home address has been verified and is correct in the chart;daughter Jeryl Columbiaeresa Hinshaw [phone]) to be contacted to arrange time of delivery.   [facility] aware of the above.   Completed discharge summary will [facility] at [phone] when final.   Please notify [facility] when patient is ready to leave unit at discharge-call [phone].   [facility] information and contact numbers have been given to daughter during phone call, pamphlet left at bedside.  Above information shared with Cloyd StagersSarah Jeffries,CMRN.  Please call with any questions.   Thank You,  Haynes Bastracy Ennis RN, BSN  [facility] Hospital Liaison  [phone]

## 2016-04-22 NOTE — Progress Notes (Signed)
Patient Name: Diamond Thomas Date of Encounter: 04/22/2016  Primary Cardiologist: Ward Chattersave Harding, MD  Hospital Problem List     Principal Problem:   Anasarca Active Problems:   Essential hypertension   Chronic atrial fibrillation [facility](HCC):  CHA2DS2-VASc Score 5, On Warfarin   Palliative care encounter   Acute on chronic diastolic CHF (congestive heart failure) (HCC)   Acute on chronic congestive heart failure (HCC)   Moderate to severe pulmonary hypertension   Pressure injury of skin     Subjective   Lasix increased yesterday - but not recorded more negative. ?weight now up 2 lbs. Problems with hypotension overnight - BP meds held. Discussion with palliative care and patient now DNR. Creatinine stable - magnesium 2. Potassium remains low. Albumin low at 2.7.  Inpatient Medications    Scheduled Meds: . digoxin  0.125 mg Oral Daily  . loratadine  10 mg Oral Daily  . magnesium oxide  200 mg Oral Daily  . methimazole  5 mg Oral BID  . metolazone  2.5 mg Oral QPC breakfast  . metoprolol tartrate  25 mg Oral BID  . pantoprazole  40 mg Oral Daily  . potassium chloride  40 mEq Oral BID  . sodium chloride flush  3 mL Intravenous Q12H  . Warfarin - Pharmacist Dosing Inpatient   Does not apply q1800   Continuous Infusions: . furosemide (LASIX) infusion 15 mg/hr (04/21/16 2257)   PRN Meds: sodium chloride, acetaminophen, guaiFENesin, ipratropium-albuterol, menthol-cetylpyridinium, morphine CONCENTRATE, ondansetron (ZOFRAN) IV, phenol, sodium chloride flush   Vital Signs    Vitals:   04/21/16 0500 04/21/16 1945 04/22/16 0440 04/22/16 0454  BP: (!) 104/57 (!) 105/35 (!) 111/48   Pulse: 71 63 82   Resp:  20 20   Temp: 97.8 F (36.6 C) 97.5 F (36.4 C) 98 F (36.7 C)   TempSrc: Oral Oral Oral   SpO2: 98% 97% 93%   Weight: 112 lb 8 oz (51 kg)   114 lb 11.2 oz (52 kg)  Height:        Intake/Output Summary (Last 24 hours) at 04/22/16 1021 Last data filed at 04/22/16 0859  Gross per 24 hour  Intake           227.75 ml  Output              701 ml  Net          -473.25 ml   Filed Weights   04/20/16 0500 04/21/16 0500 04/22/16 0454  Weight: 112 lb (50.8 kg) 112 lb 8 oz (51 kg) 114 lb 11.2 oz (52 kg)    Physical Exam    GEN: Frail and chronically ill-appearing. In no acute distress.  HEENT: Grossly normal.  Neck: Supple, JVP to above the tragus.  No carotid bruits, or masses. Cardiac: Irregularly irregular.  III/VI holosystolic murmur at the LLSB.  +RV heave.  No clubbing, cyanosis.  2+ pitting edema to the upper tibia bilaterally.  Radials/DP/PT 2+ and equal bilaterally.  Respiratory:  Respirations regular and unlabored, clear to auscultation bilaterally. GI: Soft, nontender, nondistended, BS + x 4. MS: no deformity or atrophy. Skin: warm and dry, no rash. Neuro:  Strength and sensation are intact. Psych: AAOx3.  Normal affect.  Labs    CBC  Recent Labs  04/20/16 0413 04/21/16 0436  WBC 8.2 7.0  HGB 11.2* 10.9*  HCT 35.0* 34.5*  MCV 81.2 81.9  PLT 219 200   Basic Metabolic Panel  Recent Labs  04/21/16  1610 04/21/16 1021 04/22/16 0253  NA 138  --  141  K 3.2*  --  3.0*  CL 95*  --  93*  CO2 32  --  36*  GLUCOSE 100*  --  123*  BUN 50*  --  53*  CREATININE 0.94  --  0.92  CALCIUM 8.7*  --  8.9  MG  --  2.0  --    Liver Function Tests  Recent Labs  04/21/16 0436  AST 36  ALT 18  ALKPHOS 139*  BILITOT 1.4*  PROT 6.3*  ALBUMIN 2.7*   No results for input(s): LIPASE, AMYLASE in the last 72 hours. Cardiac Enzymes No results for input(s): CKTOTAL, CKMB, CKMBINDEX, TROPONINI in the last 72 hours. BNP Invalid input(s): POCBNP D-Dimer No results for input(s): DDIMER in the last 72 hours. Hemoglobin A1C No results for input(s): HGBA1C in the last 72 hours. Fasting Lipid Panel No results for input(s): CHOL, HDL, LDLCALC, TRIG, CHOLHDL, LDLDIRECT in the last 72 hours. Thyroid Function Tests No results for input(s): TSH,  T4TOTAL, T3FREE, THYROIDAB in the last 72 hours.  Invalid input(s): FREET3  Telemetry     Atrial fibrillation.  Rates <100 bpm.  Pause up to 1.7s.  - Personally Reviewed  ECG    N/A  Radiology    No results found.  Cardiac Studies   Echo 01/27/16: Study Conclusions  - Left ventricle: The cavity size was normal. Wall thickness was   normal. Systolic function was normal. The estimated ejection   fraction was in the range of 60% to 65%. Wall motion was normal;   there were no regional wall motion abnormalities. The study is   not technically sufficient to allow evaluation of LV diastolic   function. - Ventricular septum: The contour showed diastolic flattening and   systolic flattening. - Aortic valve: Mildly calcified annulus. Trileaflet; mildly   calcified leaflets. - Mitral valve: Calcified annulus. There was mild regurgitation. - Left atrium: The atrium was moderately dilated. - Right ventricle: The cavity size was moderately to severely   dilated. Systolic function was mildly to moderately reduced. - Right atrium: The atrium was severely dilated. Central venous   pressure (est): 15 mm Hg. - Atrial septum: No defect or patent foramen ovale was identified. - Tricuspid valve: There was moderate regurgitation. - Pulmonary arteries: Systolic pressure was severely increased. PA   peak pressure: 70 mm Hg (S). - Pericardium, extracardiac: A trivial pericardial effusion was   identified.  Impressions:  - Normal LV wall thickness with LVEF 60-65%. Indeterminate   diastolic function in the setting of atrial fibrillation. LV   septal flattening noted consistent with RV pressure and volume   overload. Moderate left atrial enlargement. MAC with mild mitral   regurgitation. Mildly sclerotic aortic valve. Moderate to severe   RV enlargement with moderately decreased contraction. Severe   right atrial enlargement with elevated estimated CVP. At least   moderate tricuspid  regurgitation noted with evidence of severe   pulmonary hypertension and PASP estimated 70 mmHg. Trivial   pericardial effusion.  Patient Profile     Diamond Thomas is an 72F with severe pulmonary hypertension, chronic atrial fibrillation, chronic diastolic heart failure, hypertension, and hyperlipidemia who presents with increased shortness of breath and volume overload  Assessment & Plan    # Severe pulmonary hypertension:  # Right heart failure: # Acute on chronic diastolic heart failure: Ms. Mccaster remains significantly overloaded. Continue diuresis unless hypotension or rising creatinine prohibits it or the  patient/family elect to take a comfort approach.   #Hypokalemia: Magnesium level acceptable - potassium remains low despite supplementation. Increase potassium.  # Permanent atrial fibrillation: Continue metoprolol and replete potassium as above.  Warfarin for anticoagulation.  She was started on methimazole for hyperthyroidism this admission.  -- This patients CHA2DS2-VASc Score and unadjusted Ischemic Stroke Rate (% per year) is equal to 7.2 % stroke rate/year from a score of 5Above score calculated as 1 point each if present [CHF, HTN, DM, Vascular=MI/PAD/Aortic Plaque, Age if 40-74, orFemale]Above score calculated as 2 points each if present [Age >75, or Stroke/TIA/TE]  # Hypertension: Blood pressure well-controlled.   Chrystie Nose, MD, [facility] Attending Cardiologist [facility]  Chrystie Nose, MD  04/22/2016, 10:21 AM

## 2016-04-22 NOTE — Progress Notes (Signed)
ANTICOAGULATION CONSULT NOTE - Follow Up Consult  Pharmacy Consult for Coumadin Indication: atrial fibrillation  Allergies  Allergen Reactions  . Pravachol [Pravastatin Sodium] Other (See Comments)    Myalgias  . Latex Itching    Patient Measurements: Height: 5\' 2"  (157.5 cm) Weight: 114 lb 11.2 oz (52 kg) IBW/kg (Calculated) : 50.1  Vital Signs: Temp: 98 F (36.7 C) (10/22 0440) Temp Source: Oral (10/22 0440) BP: 100/37 (10/22 1000) Pulse Rate: 62 (10/22 1000)  Labs:  Recent Labs  04/20/16 0413 04/20/16 1603 04/21/16 0436 04/22/16 0253  HGB 11.2*  --  10.9*  --   HCT 35.0*  --  34.5*  --   PLT 219  --  200  --   LABPROT 26.8*  --  31.2* 33.3*  INR 2.42  --  2.93 3.17  CREATININE 0.99 0.94 0.94 0.92    Estimated Creatinine Clearance: 32.8 mL/min (by C-G formula based on SCr of 0.92 mg/dL).   Assessment:   82 yr old female continues on Coumadin as prior to admission for afib.   INR supratherapeutic at 3.17. Coumadin decreased to 0.5 mg on 10/21 after INR  trended up after Coumadin 2 mg daily x 2 days.  No bleeding reported.    Home Coumadin regimen: 2 mg daily, but INR supratherapeutic on admission (3.64), and held for 2 days.  Now on Methimazole, which can effect Coumadin needs.  Low PO intake, which can also increase sensitivity to Coumadin.  Goal of Therapy:  INR 2-3 Monitor platelets by anticoagulation protocol: Yes   Plan:   Hold Coumadin today.  Daily PT/INR.  Dennie FettersEgan, Chia Mowers Donovan, RPh Pager: [phone] 04/22/2016,11:56 AM

## 2016-04-23 ENCOUNTER — Telehealth: Payer: Self-pay | Admitting: Cardiology

## 2016-04-23 DIAGNOSIS — I5033 Acute on chronic diastolic (congestive) heart failure: Secondary | ICD-10-CM

## 2016-04-23 LAB — BASIC METABOLIC PANEL
ANION GAP: 12 (ref 5–15)
BUN: 53 mg/dL — ABNORMAL HIGH (ref 6–20)
CALCIUM: 9.2 mg/dL (ref 8.9–10.3)
CO2: 37 mmol/L — ABNORMAL HIGH (ref 22–32)
CREATININE: 0.92 mg/dL (ref 0.44–1.00)
Chloride: 91 mmol/L — ABNORMAL LOW (ref 101–111)
GFR, EST NON AFRICAN AMERICAN: 54 mL/min — AB (ref >60)
Glucose, Bld: 118 mg/dL — ABNORMAL HIGH (ref 65–99)
Potassium: 3.9 mmol/L (ref 3.5–5.1)
SODIUM: 140 mmol/L (ref 135–145)

## 2016-04-23 LAB — PROTIME-INR
INR: 2.64
PROTHROMBIN TIME: 28.7 s — AB (ref 11.4–15.2)

## 2016-04-23 MED ORDER — MAGNESIUM HYDROXIDE 400 MG/5ML PO SUSP: 15.0000 mL | Freq: Every day | ORAL | Status: DC | PRN

## 2016-04-23 MED ORDER — WARFARIN SODIUM 1 MG PO TABS
0.5000 mg | ORAL_TABLET | Freq: Once | ORAL | Status: AC
  Administered 2016-04-23: 0.5 mg via ORAL
  Filled 2016-04-23: qty 1

## 2016-04-23 NOTE — Progress Notes (Signed)
Patient Name: Diamond Thomas Date of Encounter: 04/23/2016  Primary Cardiologist: [facility]  Hospital Problem List     Principal Problem:   Anasarca Active Problems:   Essential hypertension   Chronic atrial fibrillation [facility]):  CHA2DS2-VASc Score 5, On Warfarin   Palliative care encounter   Acute on chronic diastolic CHF (congestive heart failure) (HCC)   Acute on chronic congestive heart failure (HCC)   Moderate to severe pulmonary hypertension   Pressure injury of skin     Subjective   No complaints  Inpatient Medications    Scheduled Meds: . digoxin  0.125 mg Oral Daily  . loratadine  10 mg Oral Daily  . magnesium oxide  200 mg Oral Daily  . methimazole  5 mg Oral BID  . metolazone  2.5 mg Oral QPC breakfast  . metoprolol tartrate  25 mg Oral BID  . pantoprazole  40 mg Oral Daily  . potassium chloride  40 mEq Oral TID  . sodium chloride flush  3 mL Intravenous Q12H  . Warfarin - Pharmacist Dosing Inpatient   Does not apply q1800   Continuous Infusions: . furosemide (LASIX) infusion 15 mg/hr (04/22/16 1551)   PRN Meds: sodium chloride, acetaminophen, guaiFENesin, ipratropium-albuterol, magnesium hydroxide, menthol-cetylpyridinium, morphine CONCENTRATE, ondansetron (ZOFRAN) IV, phenol, sodium chloride flush   Vital Signs    Vitals:   04/22/16 1000 04/22/16 1500 04/22/16 2005 04/23/16 0433  BP: (!) 100/37 (!) 112/48 (!) 124/45 (!) 115/46  Pulse: 62 74 88 91  Resp:  18 20 20   Temp:  97.7 F (36.5 C) 98.4 F (36.9 C) 97.6 F (36.4 C)  TempSrc:  Oral Oral Oral  SpO2:  96% 98% 95%  Weight:    115 lb 12.8 oz (52.5 kg)  Height:        Intake/Output Summary (Last 24 hours) at 04/23/16 1006 Last data filed at 04/22/16 2112  Gross per 24 hour  Intake                0 ml  Output              775 ml  Net             -775 ml   Filed Weights   04/21/16 0500 04/22/16 0454 04/23/16 0433  Weight: 112 lb 8 oz (51 kg) 114 lb 11.2 oz (52 kg) 115 lb 12.8 oz (52.5 kg)      Physical Exam   GEN: frail female in no acute distress.  HEENT: Grossly normal.  Neck: Supple, +JVD to jaw bil. Cardiac: irreg irreg ,+ 2-3/6 systolic murmur, no rubs, or gallops. No clubbing, cyanosis,  Edema in legs mostly resolved + wrinkles.  Radials 2+ and equal bilaterally.  Respiratory:  Respirations regular and unlabored, clear to auscultation bilaterally without rales, occ rhonchi and no wheezes. GI: Soft, nontender, nondistended, BS + x 4. MS: no deformity or atrophy. Lt arm at IV site infiltrated with redness and pain Skin: warm and dry,brisk capillary refill Neuro: AAOx3. Moves all ext. Follows commands Psych:   Normal affect.  Labs    CBC  Recent Labs  04/21/16 0436  WBC 7.0  HGB 10.9*  HCT 34.5*  MCV 81.9  PLT 200   Basic Metabolic Panel  Recent Labs  04/21/16 1021 04/22/16 0253 04/23/16 0219  NA  --  141 140  K  --  3.0* 3.9  CL  --  93* 91*  CO2  --  36* 37*  GLUCOSE  --  123* 118*  BUN  --  53* 53*  CREATININE  --  0.92 0.92  CALCIUM  --  8.9 9.2  MG 2.0  --   --    Liver Function Tests  Recent Labs  04/21/16 0436  AST 36  ALT 18  ALKPHOS 139*  BILITOT 1.4*  PROT 6.3*  ALBUMIN 2.7*   No results for input(s): LIPASE, AMYLASE in the last 72 hours. Cardiac Enzymes No results for input(s): CKTOTAL, CKMB, CKMBINDEX, TROPONINI in the last 72 hours. BNP Invalid input(s): POCBNP D-Dimer No results for input(s): DDIMER in the last 72 hours. Hemoglobin A1C No results for input(s): HGBA1C in the last 72 hours. Fasting Lipid Panel No results for input(s): CHOL, HDL, LDLCALC, TRIG, CHOLHDL, LDLDIRECT in the last 72 hours. Thyroid Function Tests No results for input(s): TSH, T4TOTAL, T3FREE, THYROIDAB in the last 72 hours.  Invalid input(s): FREET3  Telemetry    A fib 70s to 90s  - Personally Reviewed  ECG    No new EKGS - A fib without acute ST changesPersonally Reviewed  Radiology    No results found.  Cardiac Studies    Previous Echo 12/2015  Impressions:  - Normal LV wall thickness with LVEF 60-65%. Indeterminate   diastolic function in the setting of atrial fibrillation. LV   septal flattening noted consistent with RV pressure and volume   overload. Moderate left atrial enlargement. MAC with mild mitral   regurgitation. Mildly sclerotic aortic valve. Moderate to severe   RV enlargement with moderately decreased contraction. Severe   right atrial enlargement with elevated estimated CVP. At least   moderate tricuspid regurgitation noted with evidence of severe   pulmonary hypertension and PASP estimated 70 mmHg. Trivial   pericardial effusion Patient Profile     Ms. Atlas is an [age]F with severe pulmonary hypertension, chronic atrial fibrillation, chronic diastolic heart failure, hypertension, and hyperlipidemia who presents with increased shortness of breath and volume overload  Assessment & Plan     Severe pulmonary hypertension:  # Right heart failure: # Acute on chronic diastolic heart failure: Ms. Linna DarnerWyrick improved with volume overload.  Continue diuresis unless hypotension or rising creatinine prohibits it or the patient/family elect to take a comfort approach. On metolazone 2.5 mg daily and lasix drip at 15 mg.   Negative 5,106 since admit and wt is down from 124 on admit to 115.12 today.  ? Change to oral lasix?    #Hypokalemia: Magnesium level acceptable - potassium remained low despite supplementation. Increased potassium, today 3.9.  Now on KDUR 4X day.   # Permanent atrial fibrillation: Continue metoprolol and replete potassium as above.  Warfarin for anticoagulation.  She was started on methimazole for hyperthyroidism this admission. INR 2.64 pharmacy following -- This patients CHA2DS2-VASc Score and unadjusted Ischemic Stroke Rate (% per year) is equal to 7.2 % stroke rate/year from a score of 5Above score calculated as 1 point each if present [CHF, HTN, DM, Vascular=MI/PAD/Aortic  Plaque, Age if 4865-74, orFemale]Above score calculated as 2 points each if present [Age >75, or Stroke/TIA/TE]  # Hypertension: Blood pressure well-controlled.   Signed, Nada BoozerLaura Ingold, NP  04/23/2016, 10:06 AM   The patient has been seen in conjunction with Nada BoozerLaura Ingold, NP. All aspects of care have been considered and discussed. The patient has been personally interviewed, examined, and all clinical data has been reviewed.   She is doing okay. Based upon urine output, any function, and blood pressure, diuresis should be continued.  On exam  there is still evidence of excess volume.  Follow kidney function closely.

## 2016-04-23 NOTE — Telephone Encounter (Signed)
Diamond Thomas with Hospice calling to verify that once patient is discharged. Dr. Jack QuartoHrading will be her attending?  pls call

## 2016-04-23 NOTE — Progress Notes (Signed)
ANTICOAGULATION CONSULT NOTE - Follow Up Consult  Pharmacy Consult for Coumadin Indication: atrial fibrillation  Allergies  Allergen Reactions  . Pravachol [Pravastatin Sodium] Other (See Comments)    Myalgias  . Latex Itching    Patient Measurements: Height: 5\' 2"  (157.5 cm) Weight: 115 lb 12.8 oz (52.5 kg) IBW/kg (Calculated) : 50.1  Vital Signs: Temp: 97.6 F (36.4 C) (10/23 0433) Temp Source: Oral (10/23 0433) BP: 115/46 (10/23 0433) Pulse Rate: 91 (10/23 0433)  Labs:  Recent Labs  04/21/16 0436 04/22/16 0253 04/23/16 0219  HGB 10.9*  --   --   HCT 34.5*  --   --   PLT 200  --   --   LABPROT 31.2* 33.3* 28.7*  INR 2.93 3.17 2.64  CREATININE 0.94 0.92 0.92    Estimated Creatinine Clearance: 32.8 mL/min (by C-G formula based on SCr of 0.92 mg/dL).   Assessment: 82 yr old female continues on warfarin as prior to admission for afib. INR supratherapeutic on admit 3.64 and doses held x 2 days, now therapeutic again 2.65 after dose held last night. Low PO intake noted, can affect warfarin sensitivity. Also on new Methimazole - may affect INR. CBC stable, no bleed documented.  Home dose: 2mg  daily   Goal of Therapy:  INR 2-3 Monitor platelets by anticoagulation protocol: Yes   Plan:  Warfarin 0.5mg  x 1 dose tonight Daily INR Monitor for s/sx bleeding   Babs BertinHaley Nattaly Yebra, PharmD, BCPS Clinical Pharmacist 04/23/2016 11:04 AM

## 2016-04-23 NOTE — Telephone Encounter (Signed)
Current hospital admission to be discharged to [facility]. Routed to Dr. Herbie BaltimoreHarding for recommendations. Pt admitted by Dr. Elvera LennoxGherghe. Dr. Nicholos Johnsamachandran listed as PCP.

## 2016-04-23 NOTE — Evaluation (Signed)
Occupational Therapy Evaluation Patient Details Name: Diamond Thomas MRN: [id number] DOB: 12-Nov-1924 Today's Date: 04/23/2016    History of Present Illness 82 yo female with planned palliative care consult was admitted for SOB and anasarca, CHF exacerbation with refusal of catheterization.  PMHx:  pulm HTN, a-fib, CHF, HTN, TIA   Clinical Impression   Pt admitted with above. She demonstrates the below listed deficits and will benefit from continued OT to maximize safety and independence with BADLs.  Per chart, plan is for pt to return home with daughter and Hospice.  Pt currently requires max - total A for ADLs, max A for moving to EOB, and max A to stand briefly.  Pt is very fearful of falling and pushes posteriorly in standing.  She also fatigues quickly.  Pt indicates she has w/c at home.  Would recommend hoyer lift for safe transfers OOB as I am doubtful, daughter can safely assist her with transfers.   Will follow acutely to assist with safety with functional transfers.       Follow Up Recommendations  No OT follow up;Supervision/Assistance - 24 hour    Equipment Recommendations  3 in 1 bedside comode;Hospital bed;hoyer lift   Recommendations for Other Services       Precautions / Restrictions Precautions Precautions: Fall      Mobility Bed Mobility Overal bed mobility: Needs Assistance Bed Mobility: Sit to Supine;Supine to Sit     Supine to sit: Max assist Sit to supine: Max assist   General bed mobility comments: Pt able to assist minimally with moving LEs to EOB and lifting trunk.  She is very fearful of falling and pushes posteriorly   Transfers Overall transfer level: Needs assistance Equipment used: 1 person hand held assist Transfers: Sit to/from Stand Sit to Stand: Max assist         General transfer comment: Pt resistive to attempts due to fear of falling and fatigue.  Requires max encouragement.  She will stand momentarily with max A then sits  immediately.  Unable to convince her to try again     Balance Overall balance assessment: Needs assistance Sitting-balance support: Feet supported Sitting balance-Leahy Scale: Poor Sitting balance - Comments: Pt requires mod A to maintain EOB sitting  Postural control: Posterior lean Standing balance support: Bilateral upper extremity supported Standing balance-Leahy Scale: Poor Standing balance comment: requires max A                             ADL Overall ADL's : Needs assistance/impaired Eating/Feeding: Set up;Bed level   Grooming: Wash/dry hands;Wash/dry face;Brushing hair;Set up;Bed level   Upper Body Bathing: Maximal assistance;Bed level   Lower Body Bathing: Maximal assistance;Bed level   Upper Body Dressing : Maximal assistance;Bed level   Lower Body Dressing: Total assistance;Bed level   Toilet Transfer: Total assistance Toilet Transfer Details (indicate cue type and reason): Unable to attempt.  Pt very fearful with standing and immediately sits back down  Toileting- Clothing Manipulation and Hygiene: Total assistance;Bed level       Functional mobility during ADLs: Maximal assistance       Vision     Perception     Praxis      Pertinent Vitals/Pain Pain Assessment: No/denies pain     Hand Dominance Right   Extremity/Trunk Assessment Upper Extremity Assessment Upper Extremity Assessment: Generalized weakness   Lower Extremity Assessment Lower Extremity Assessment: Defer to PT evaluation   Cervical /  Trunk Assessment Cervical / Trunk Assessment: Kyphotic   Communication Communication Communication: HOH   Cognition Arousal/Alertness: Awake/alert Behavior During Therapy: Flat affect Overall Cognitive Status: No family/caregiver present to determine baseline cognitive functioning       Memory: Decreased short-term memory;Decreased recall of precautions             General Comments       Exercises       Shoulder  Instructions      Home Living Family/patient expects to be discharged to:: Private residence Living Arrangements: Children Available Help at Discharge: Family;Available 24 hours/day Type of Home: House                       Home Equipment: Walker - 2 wheels;Wheelchair - manual   Additional Comments: Per chart, plan is for pt to discharge home with daughter and Hospice care, as well as possible hired PCA.  Pt is unsure of the plan and daugther not present at this time       Prior Functioning/Environment Level of Independence: Needs assistance  Gait / Transfers Assistance Needed: uses RW in home, WC to go out, at times needs assist for transfers ADL's / Homemaking Assistance Needed: daughter assists with sponge bath   Comments: Pt unable to provide info         OT Problem List: Decreased strength;Decreased activity tolerance;Impaired balance (sitting and/or standing);Decreased cognition;Decreased safety awareness;Decreased knowledge of use of DME or AE;Cardiopulmonary status limiting activity   OT Treatment/Interventions: Self-care/ADL training;Therapeutic exercise;DME and/or AE instruction;Therapeutic activities;Patient/family education;Balance training;Cognitive remediation/compensation    OT Goals(Current goals can be found in the care plan section) Acute Rehab OT Goals Patient Stated Goal: none stated OT Goal Formulation: With patient Time For Goal Achievement: 05/07/16 Potential to Achieve Goals: Fair ADL Goals Pt Will Transfer to Toilet: with mod assist;bedside commode Pt Will Perform Toileting - Clothing Manipulation and hygiene: with max assist;sitting/lateral leans;sit to/from stand  OT Frequency: Min 2X/week   Barriers to D/C:            Co-evaluation              End of Session Equipment Utilized During Treatment: Gait belt;Oxygen Nurse Communication: Mobility status  Activity Tolerance: Patient limited by fatigue Patient left: in bed;with call  bell/phone within reach;with bed alarm set   Time: [id number] OT Time Calculation (min): 22 min Charges:  OT General Charges $OT Visit: 1 Procedure OT Evaluation $OT Eval Moderate Complexity: 1 Procedure G-Codes:    Jeani HawkingConarpe, Tamberly Pomplun M 04/23/2016, 12:44 PM

## 2016-04-23 NOTE — Progress Notes (Signed)
PROGRESS NOTE  Diamond Thomas ZOX:[id number] DOB: 1924-10-01 DOA: 04/17/2016 PCP: Georgianne Fick, MD   LOS: 5 days   Brief Narrative: 82 y.o. female with medical history significant of R heart failure, long standing h/o A.Fib on coumadin and rate controlled since 2011, admitted on 10/17 with significant fluid overload and shortness of breath.  Assessment & Plan: Principal Problem:   Anasarca Active Problems:   Essential hypertension   Chronic atrial fibrillation (HCC):  CHA2DS2-VASc Score 5, On Warfarin   Palliative care encounter   Acute on chronic diastolic CHF (congestive heart failure) (HCC)   Acute on chronic congestive heart failure (HCC)   Moderate to severe pulmonary hypertension   Pressure injury of skin    Acute on chronic right heart failure - continue Lasix per cardiology - weight 124 >> 119>>112 >> 112 >> 114 >> 115 - renal function is stable, continue diuresis. Replace K  Acute on chronic confusion / suspect underlying dementia - mental status fluctuates - suspect contributing is also her underlying hyperthyroidism   Hypertension - She is on metoprolol, continue  Hypokalemia - due to Lasix  Dysphagia / recurrent aspiration - Patient on PPI at home, daughter is aware that she occasionally has coughing spells when she eats and has difficulties with pills at home. - SLP recommended regular diet   Atrial fibrillation - patient's CHA2DS2-VASc Score for Stroke Risk is greater than 2, anticoagulate with Coumadin per pharmacy - Continue metoprolol and digoxin  Goals of care discussion - Discussed with daughter over the phone, patient has expressed her wishes in the past and reiterated full CODE STATUS at this time. - Daughter aware of risk of aspiration - palliative discussions ongoing, home with hospice once cleared by cardiology   Hyperthyroidism - Patient's TSH was undetectable and free T4 was elevated to 3.3 - Supposed to get thyroid scan as an  outpatient - Her A. fib is currently well controlled - started methimazole. Will need repeat TSH/Free T4 as an outpatient   Chronic hypoxic respiratory failure - Continue home oxygen 2 L nasal cannula, respiratory status stable after aspiration without need for increased oxygen requirements  Severe protein calorie malnutrition   DVT prophylaxis: Coumadin Code Status: Full code Family Communication: no family at bedside today, called daughter, no answer Disposition Plan: TBD  Consultants:   Cardiology  Palliative  Procedures:   None   Antimicrobials:  None    Subjective: - no chest pain, shortness of breath, no abdominal pain, nausea or vomiting.   Objective: Vitals:   04/22/16 1000 04/22/16 1500 04/22/16 2005 04/23/16 0433  BP: (!) 100/37 (!) 112/48 (!) 124/45 (!) 115/46  Pulse: 62 74 88 91  Resp:  18 20 20   Temp:  97.7 F (36.5 C) 98.4 F (36.9 C) 97.6 F (36.4 C)  TempSrc:  Oral Oral Oral  SpO2:  96% 98% 95%  Weight:    52.5 kg (115 lb 12.8 oz)  Height:        Intake/Output Summary (Last 24 hours) at 04/23/16 1051 Last data filed at 04/23/16 1028  Gross per 24 hour  Intake              240 ml  Output             1225 ml  Net             -985 ml   Filed Weights   04/21/16 0500 04/22/16 0454 04/23/16 0433  Weight: 51 kg (112 lb 8 oz) 52  kg (114 lb 11.2 oz) 52.5 kg (115 lb 12.8 oz)    Examination: Constitutional: NAD, ill appearing Vitals:   04/22/16 1000 04/22/16 1500 04/22/16 2005 04/23/16 0433  BP: (!) 100/37 (!) 112/48 (!) 124/45 (!) 115/46  Pulse: 62 74 88 91  Resp:  18 20 20   Temp:  97.7 F (36.5 C) 98.4 F (36.9 C) 97.6 F (36.4 C)  TempSrc:  Oral Oral Oral  SpO2:  96% 98% 95%  Weight:    52.5 kg (115 lb 12.8 oz)  Height:       Eyes: PERRL, lids and conjunctivae normal Respiratory: Overall Decreased breath sounds, no wheezing  Cardiovascular: Irregular, no MRG, 2+ pitting edema    Data Reviewed: I have personally reviewed  following labs and imaging studies  CBC:  Recent Labs Lab 04/17/16 1611 04/19/16 0221 04/20/16 0413 04/21/16 0436  WBC 7.2 7.1 8.2 7.0  NEUTROABS 5.0  --   --   --   HGB 10.9* 11.2* 11.2* 10.9*  HCT 33.8* 34.7* 35.0* 34.5*  MCV 81.1 80.7 81.2 81.9  PLT 218 224 219 200   Basic Metabolic Panel:  Recent Labs Lab 04/20/16 0413 04/20/16 1603 04/21/16 0436 04/21/16 1021 04/22/16 0253 04/23/16 0219  NA 140 139 138  --  141 140  K 2.8* 3.1* 3.2*  --  3.0* 3.9  CL 95* 99* 95*  --  93* 91*  CO2 35* 27 32  --  36* 37*  GLUCOSE 117* 128* 100*  --  123* 118*  BUN 48* 46* 50*  --  53* 53*  CREATININE 0.99 0.94 0.94  --  0.92 0.92  CALCIUM 9.2 8.7* 8.7*  --  8.9 9.2  MG  --   --   --  2.0  --   --    GFR: Estimated Creatinine Clearance: 32.8 mL/min (by C-G formula based on SCr of 0.92 mg/dL). Liver Function Tests:  Recent Labs Lab 04/17/16 1611 04/21/16 0436  AST 36 36  ALT 21 18  ALKPHOS 154* 139*  BILITOT 1.4* 1.4*  PROT 6.8 6.3*  ALBUMIN 2.7* 2.7*    Recent Labs Lab 04/17/16 1611  LIPASE 30   No results for input(s): AMMONIA in the last 168 hours. Coagulation Profile:  Recent Labs Lab 04/19/16 0221 04/20/16 0413 04/21/16 0436 04/22/16 0253 04/23/16 0219  INR 2.54 2.42 2.93 3.17 2.64   Cardiac Enzymes:  Recent Labs Lab 04/17/16 2116 04/18/16 0233 04/18/16 0856  TROPONINI <0.03 <0.03 <0.03   Thyroid Function Tests: No results for input(s): TSH, T4TOTAL, FREET4, T3FREE, THYROIDAB in the last 72 hours. Anemia Panel: No results for input(s): VITAMINB12, FOLATE, FERRITIN, TIBC, IRON, RETICCTPCT in the last 72 hours. Urine analysis:    Component Value Date/Time   COLORURINE YELLOW 01/27/2016 1253   APPEARANCEUR CLEAR 01/27/2016 1253   LABSPEC 1.011 01/27/2016 1253   PHURINE 6.5 01/27/2016 1253   GLUCOSEU NEGATIVE 01/27/2016 1253   HGBUR NEGATIVE 01/27/2016 1253   BILIRUBINUR NEGATIVE 01/27/2016 1253   KETONESUR NEGATIVE 01/27/2016 1253    PROTEINUR NEGATIVE 01/27/2016 1253   UROBILINOGEN 0.2 10/03/2014 1657   NITRITE NEGATIVE 01/27/2016 1253   LEUKOCYTESUR NEGATIVE 01/27/2016 1253   Sepsis Labs: Invalid input(s): PROCALCITONIN, LACTICIDVEN  No results found for this or any previous visit (from the past 240 hour(s)).    Radiology Studies: No results found.   Scheduled Meds: . digoxin  0.125 mg Oral Daily  . loratadine  10 mg Oral Daily  . magnesium oxide  200 mg  Oral Daily  . methimazole  5 mg Oral BID  . metolazone  2.5 mg Oral QPC breakfast  . metoprolol tartrate  25 mg Oral BID  . pantoprazole  40 mg Oral Daily  . potassium chloride  40 mEq Oral TID  . sodium chloride flush  3 mL Intravenous Q12H  . Warfarin - Pharmacist Dosing Inpatient   Does not apply q1800   Continuous Infusions: . furosemide (LASIX) infusion 15 mg/hr (04/22/16 1551)     Pamella Pert, MD, PhD [facility] Pager [phone]  If 7PM-7AM, please contact night-coverage [email or web address] Password [email or web address] 04/23/2016, 10:51 AM

## 2016-04-24 DIAGNOSIS — I509 Heart failure, unspecified: Secondary | ICD-10-CM

## 2016-04-24 LAB — PROTIME-INR
INR: 2.16
Prothrombin Time: 24.4 seconds — ABNORMAL HIGH (ref 11.4–15.2)

## 2016-04-24 LAB — BASIC METABOLIC PANEL
ANION GAP: 13 (ref 5–15)
BUN: 56 mg/dL — AB (ref 6–20)
CHLORIDE: 88 mmol/L — AB (ref 101–111)
CO2: 36 mmol/L — ABNORMAL HIGH (ref 22–32)
Calcium: 9 mg/dL (ref 8.9–10.3)
Creatinine, Ser: 1 mg/dL (ref 0.44–1.00)
GFR calc Af Amer: 56 mL/min — ABNORMAL LOW (ref >60)
GFR calc non Af Amer: 48 mL/min — ABNORMAL LOW (ref >60)
Glucose, Bld: 116 mg/dL — ABNORMAL HIGH (ref 65–99)
POTASSIUM: 2.8 mmol/L — AB (ref 3.5–5.1)
Sodium: 137 mmol/L (ref 135–145)

## 2016-04-24 LAB — DIGOXIN LEVEL: DIGOXIN LVL: 1.1 ng/mL (ref 0.8–2.0)

## 2016-04-24 MED ORDER — WARFARIN SODIUM 1 MG PO TABS
1.0000 mg | ORAL_TABLET | Freq: Once | ORAL | Status: AC
Start: 2016-04-24 — End: 2016-04-24
  Administered 2016-04-24: 1 mg via ORAL
  Filled 2016-04-24: qty 1

## 2016-04-24 MED ORDER — FUROSEMIDE 80 MG PO TABS
80.0000 mg | ORAL_TABLET | Freq: Two times a day (BID) | ORAL | Status: DC
  Administered 2016-04-24 – 2016-04-25 (×2): 80 mg via ORAL
  Filled 2016-04-24 (×3): qty 1

## 2016-04-24 MED ORDER — POTASSIUM CHLORIDE 20 MEQ PO PACK
40.0000 meq | PACK | Freq: Once | ORAL | Status: AC
  Administered 2016-04-24: 40 meq via ORAL
  Filled 2016-04-24: qty 2

## 2016-04-24 NOTE — Care Management Important Message (Signed)
Important Message  Patient Details  Name: Diamond Thomas MRN: [id number] Date of Birth: 03/26/1925   Medicare Important Message Given:  Yes    Tylan Kinn 04/24/2016, 12:53 PM

## 2016-04-24 NOTE — Progress Notes (Signed)
PROGRESS NOTE  Diamond Thomas [id number] DOB: 1924/08/08 DOA: 04/17/2016 PCP: Georgianne Fick, MD   LOS: 6 days   Brief Narrative: 82 y.o. female with medical history significant of R heart failure, long standing h/o A.Fib on coumadin and rate controlled since 2011, admitted on 10/17 with significant fluid overload and shortness of breath. Cardiology is consulted, patient is currently undergoing diuresis. Palliative care was consulted as well, plan is for patient to go home with hospice once she is medically ready  Assessment & Plan: Principal Problem:   Anasarca Active Problems:   Essential hypertension   Chronic atrial fibrillation (HCC):  CHA2DS2-VASc Score 5, On Warfarin   Palliative care encounter   Acute on chronic diastolic CHF (congestive heart failure) (HCC)   Acute on chronic congestive heart failure (HCC)   Moderate to severe pulmonary hypertension   Pressure injury of skin    Acute on chronic right heart failure - continue Lasix per cardiology - weight 124 >> 119>>112 >> 112 >> 114 >> 115 >> 111. - renal function is stable, continue diuresis per cardiology. Replace K - May be able to switch to by mouth in 24-48 hours  Acute on chronic confusion / suspect underlying dementia - mental status fluctuates - suspect contributing is also her underlying hyperthyroidism   Hypertension - She is on metoprolol, continue  Hypokalemia - due to Lasix  Dysphagia / recurrent aspiration - Patient on PPI at home, daughter is aware that she occasionally has coughing spells when she eats and has difficulties with pills at home. - SLP recommended regular diet   Atrial fibrillation - patient's CHA2DS2-VASc Score for Stroke Risk is greater than 2, anticoagulate with Coumadin per pharmacy - Continue metoprolol and digoxin  Goals of care discussion - Discussed with daughter over the phone, patient has expressed her wishes in the past and reiterated full CODE STATUS.  Palliative care was consulted, and CODE STATUS were readdressed, she is currently DO NOT RESUSCITATE and plans to return home with hospice services to follow  Hyperthyroidism - Patient's TSH was undetectable and free T4 was elevated to 3.3 - Supposed to get thyroid scan as an outpatient - Her A. fib is currently well controlled - started methimazole. Will need repeat TSH/Free T4 as an outpatient   Chronic hypoxic respiratory failure - Continue home oxygen 2 L nasal cannula, respiratory status stable after aspiration without need for increased oxygen requirements  Severe protein calorie malnutrition   DVT prophylaxis: Coumadin Code Status: Full code Family Communication: no family at bedside today, discussed with daughter over the phone on 10/23 Disposition Plan: TBD  Consultants:   Cardiology  Palliative  Procedures:   None   Antimicrobials:  None    Subjective: - denies any shortness of breath this morning, she has no chest pain   Objective: Vitals:   04/23/16 1300 04/23/16 2237 04/24/16 0434 04/24/16 0809  BP: 107/82 (!) 119/50 (!) 119/50 138/74  Pulse: 79 87 82 84  Resp: 17 15 20    Temp: 97.4 F (36.3 C) 98.1 F (36.7 C) 97.8 F (36.6 C)   TempSrc: Oral Oral Oral   SpO2: 93% 93% 95%   Weight:   50.7 kg (111 lb 11.2 oz)   Height:        Intake/Output Summary (Last 24 hours) at 04/24/16 1002 Last data filed at 04/24/16 0508  Gross per 24 hour  Intake              230 ml  Output  2151 ml  Net            -1921 ml   Filed Weights   04/22/16 0454 04/23/16 0433 04/24/16 0434  Weight: 52 kg (114 lb 11.2 oz) 52.5 kg (115 lb 12.8 oz) 50.7 kg (111 lb 11.2 oz)    Examination: Constitutional: NAD, ill appearing Vitals:   04/23/16 1300 04/23/16 2237 04/24/16 0434 04/24/16 0809  BP: 107/82 (!) 119/50 (!) 119/50 138/74  Pulse: 79 87 82 84  Resp: 17 15 20    Temp: 97.4 F (36.3 C) 98.1 F (36.7 C) 97.8 F (36.6 C)   TempSrc: Oral Oral Oral     SpO2: 93% 93% 95%   Weight:   50.7 kg (111 lb 11.2 oz)   Height:       Eyes: PERRL, lids and conjunctivae normal Respiratory: Overall Decreased breath sounds, no wheezing  Cardiovascular: Irregular, no MRG, 2+ pitting edema    Data Reviewed: I have personally reviewed following labs and imaging studies  CBC:  Recent Labs Lab 04/17/16 1611 04/19/16 0221 04/20/16 0413 04/21/16 0436  WBC 7.2 7.1 8.2 7.0  NEUTROABS 5.0  --   --   --   HGB 10.9* 11.2* 11.2* 10.9*  HCT 33.8* 34.7* 35.0* 34.5*  MCV 81.1 80.7 81.2 81.9  PLT 218 224 219 200   Basic Metabolic Panel:  Recent Labs Lab 04/20/16 1603 04/21/16 0436 04/21/16 1021 04/22/16 0253 04/23/16 0219 04/24/16 0219  NA 139 138  --  141 140 137  K 3.1* 3.2*  --  3.0* 3.9 2.8*  CL 99* 95*  --  93* 91* 88*  CO2 27 32  --  36* 37* 36*  GLUCOSE 128* 100*  --  123* 118* 116*  BUN 46* 50*  --  53* 53* 56*  CREATININE 0.94 0.94  --  0.92 0.92 1.00  CALCIUM 8.7* 8.7*  --  8.9 9.2 9.0  MG  --   --  2.0  --   --   --    GFR: Estimated Creatinine Clearance: 30.2 mL/min (by C-G formula based on SCr of 1 mg/dL). Liver Function Tests:  Recent Labs Lab 04/17/16 1611 04/21/16 0436  AST 36 36  ALT 21 18  ALKPHOS 154* 139*  BILITOT 1.4* 1.4*  PROT 6.8 6.3*  ALBUMIN 2.7* 2.7*    Recent Labs Lab 04/17/16 1611  LIPASE 30   No results for input(s): AMMONIA in the last 168 hours. Coagulation Profile:  Recent Labs Lab 04/20/16 0413 04/21/16 0436 04/22/16 0253 04/23/16 0219 04/24/16 0219  INR 2.42 2.93 3.17 2.64 2.16   Cardiac Enzymes:  Recent Labs Lab 04/17/16 2116 04/18/16 0233 04/18/16 0856  TROPONINI <0.03 <0.03 <0.03   Thyroid Function Tests: No results for input(s): TSH, T4TOTAL, FREET4, T3FREE, THYROIDAB in the last 72 hours. Anemia Panel: No results for input(s): VITAMINB12, FOLATE, FERRITIN, TIBC, IRON, RETICCTPCT in the last 72 hours. Urine analysis:    Component Value Date/Time   COLORURINE  YELLOW 01/27/2016 1253   APPEARANCEUR CLEAR 01/27/2016 1253   LABSPEC 1.011 01/27/2016 1253   PHURINE 6.5 01/27/2016 1253   GLUCOSEU NEGATIVE 01/27/2016 1253   HGBUR NEGATIVE 01/27/2016 1253   BILIRUBINUR NEGATIVE 01/27/2016 1253   KETONESUR NEGATIVE 01/27/2016 1253   PROTEINUR NEGATIVE 01/27/2016 1253   UROBILINOGEN 0.2 10/03/2014 1657   NITRITE NEGATIVE 01/27/2016 1253   LEUKOCYTESUR NEGATIVE 01/27/2016 1253   Sepsis Labs: Invalid input(s): PROCALCITONIN, LACTICIDVEN  No results found for this or any previous visit (from the  past 240 hour(s)).    Radiology Studies: No results found.   Scheduled Meds: . digoxin  0.125 mg Oral Daily  . loratadine  10 mg Oral Daily  . magnesium oxide  200 mg Oral Daily  . methimazole  5 mg Oral BID  . metolazone  2.5 mg Oral QPC breakfast  . metoprolol tartrate  25 mg Oral BID  . pantoprazole  40 mg Oral Daily  . potassium chloride  40 mEq Oral TID  . sodium chloride flush  3 mL Intravenous Q12H  . Warfarin - Pharmacist Dosing Inpatient   Does not apply q1800   Continuous Infusions: . furosemide (LASIX) infusion 15 mg/hr (04/24/16 0510)     Pamella Pert, MD, PhD [facility] Pager [phone]  If 7PM-7AM, please contact night-coverage [email or web address] Password [email or web address] 04/24/2016, 10:02 AM

## 2016-04-24 NOTE — Telephone Encounter (Signed)
Called informed- primary for attending care not cardiology Yvette  Verbalized understanding

## 2016-04-24 NOTE — Progress Notes (Signed)
PT Cancellation Note  Patient Details Name: Diamond MohairHazel O Thomas MRN: [id number] DOB: 07/01/1925   Cancelled Treatment:    Reason Eval/Treat Not Completed: Medical issues which prohibited therapy (Pt with K of 2.8 and only one does given. Will attempt next date)   Delorse Lekabor, Jancarlos Thrun Beth 04/24/2016, 12:54 PM  Delaney MeigsMaija Tabor Keyry Iracheta, PT [phone]

## 2016-04-24 NOTE — Telephone Encounter (Signed)
I do not want to be her attending for hospice. This is a primary care physician's job.  She has a primary care provider who is morning capable of being the caregiver for hospice.  Bryan Lemmaavid Harding, MD

## 2016-04-24 NOTE — Progress Notes (Signed)
ANTICOAGULATION CONSULT NOTE - Follow Up Consult  Pharmacy Consult for Coumadin Indication: atrial fibrillation  Allergies  Allergen Reactions  . Pravachol [Pravastatin Sodium] Other (See Comments)    Myalgias  . Latex Itching    Patient Measurements: Height: 5\' 2"  (157.5 cm) Weight: 111 lb 11.2 oz (50.7 kg) IBW/kg (Calculated) : 50.1  Vital Signs: Temp: 97.8 F (36.6 C) (10/24 0434) Temp Source: Oral (10/24 0434) BP: 138/74 (10/24 0809) Pulse Rate: 84 (10/24 0809)  Labs:  Recent Labs  04/22/16 0253 04/23/16 0219 04/24/16 0219  LABPROT 33.3* 28.7* 24.4*  INR 3.17 2.64 2.16  CREATININE 0.92 0.92 1.00    Estimated Creatinine Clearance: 30.2 mL/min (by C-G formula based on SCr of 1 mg/dL).   Assessment: 82 yr old female continues on warfarin as prior to admission for afib. INR supratherapeutic on admit 3.64, INR down to 2.16 after several doses were held and then resumed last night again at lower dose. Low PO intake noted, can affect warfarin sensitivity. Also on new Methimazole - may affect INR. CBC stable, no bleed documented.  Home dose: 2mg  daily   Goal of Therapy:  INR 2-3 Monitor platelets by anticoagulation protocol: Yes   Plan:  Warfarin 1mg  x 1 dose tonight Daily INR Monitor for s/sx bleeding   Babs BertinHaley Jaylissa Felty, PharmD, BCPS Clinical Pharmacist 04/24/2016 11:40 AM

## 2016-04-24 NOTE — Progress Notes (Signed)
Patient Name: Diamond Thomas Date of Encounter: 04/24/2016  Primary Cardiologist: Dr. Kristeen MissHarding  Hospital Problem List     Principal Problem:   Anasarca Active Problems:   Essential hypertension   Chronic atrial fibrillation [facility]HCC):  CHA2DS2-VASc Score 5, On Warfarin   Palliative care encounter   Acute on chronic diastolic CHF (congestive heart failure) (HCC)   Acute on chronic congestive heart failure (HCC)   Moderate to severe pulmonary hypertension   Pressure injury of skin     Subjective   No complains. Stable dyspnea.   Inpatient Medications    Scheduled Meds: . digoxin  0.125 mg Oral Daily  . loratadine  10 mg Oral Daily  . magnesium oxide  200 mg Oral Daily  . methimazole  5 mg Oral BID  . metolazone  2.5 mg Oral QPC breakfast  . metoprolol tartrate  25 mg Oral BID  . pantoprazole  40 mg Oral Daily  . potassium chloride  40 mEq Oral TID  . sodium chloride flush  3 mL Intravenous Q12H  . Warfarin - Pharmacist Dosing Inpatient   Does not apply q1800   Continuous Infusions: . furosemide (LASIX) infusion 15 mg/hr (04/24/16 0510)   PRN Meds: sodium chloride, acetaminophen, guaiFENesin, ipratropium-albuterol, magnesium hydroxide, menthol-cetylpyridinium, morphine CONCENTRATE, ondansetron (ZOFRAN) IV, phenol, sodium chloride flush   Vital Signs    Vitals:   04/23/16 1300 04/23/16 2237 04/24/16 0434 04/24/16 0809  BP: 107/82 (!) 119/50 (!) 119/50 138/74  Pulse: 79 87 82 84  Resp: 17 15 20    Temp: 97.4 F (36.3 C) 98.1 F (36.7 C) 97.8 F (36.6 C)   TempSrc: Oral Oral Oral   SpO2: 93% 93% 95%   Weight:   111 lb 11.2 oz (50.7 kg)   Height:        Intake/Output Summary (Last 24 hours) at 04/24/16 0944 Last data filed at 04/24/16 0508  Gross per 24 hour  Intake              230 ml  Output             2151 ml  Net            -1921 ml   Filed Weights   04/22/16 0454 04/23/16 0433 04/24/16 0434  Weight: 114 lb 11.2 oz (52 kg) 115 lb 12.8 oz (52.5 kg) 111  lb 11.2 oz (50.7 kg)    Physical Exam   GEN: thin frail female  in no acute distress.  HEENT: Grossly normal.  Neck: Supple, carotid bruits, or masses. + JVD Cardiac: Ir Ir with systolic murmur rubs, or gallops. No clubbing, cyanosis. Trace BL LE edema.  Radials/DP/PT 2+ and equal bilaterally.  Respiratory:  Respirations regular and unlabored, clear to auscultation bilaterally. On nasal cannula GI: Soft, nontender, nondistended, BS + x 4. MS: no deformity or atrophy. Skin: warm and dry, no rash. Neuro:  Strength and sensation are intact. Psych: AAOx3.  Normal affect.  Labs    CBC No results for input(s): WBC, NEUTROABS, HGB, HCT, MCV, PLT in the last 72 hours. Basic Metabolic Panel  Recent Labs  04/21/16 1021  04/23/16 0219 04/24/16 0219  NA  --   < > 140 137  K  --   < > 3.9 2.8*  CL  --   < > 91* 88*  CO2  --   < > 37* 36*  GLUCOSE  --   < > 118* 116*  BUN  --   < >  53* 56*  CREATININE  --   < > 0.92 1.00  CALCIUM  --   < > 9.2 9.0  MG 2.0  --   --   --   < > = values in this interval not displayed. Liver Function Tests No results for input(s): AST, ALT, ALKPHOS, BILITOT, PROT, ALBUMIN in the last 72 hours. No results for input(s): LIPASE, AMYLASE in the last 72 hours. Cardiac Enzymes No results for input(s): CKTOTAL, CKMB, CKMBINDEX, TROPONINI in the last 72 hours. BNP Invalid input(s): POCBNP D-Dimer No results for input(s): DDIMER in the last 72 hours. Hemoglobin A1C No results for input(s): HGBA1C in the last 72 hours. Fasting Lipid Panel No results for input(s): CHOL, HDL, LDLCALC, TRIG, CHOLHDL, LDLDIRECT in the last 72 hours. Thyroid Function Tests No results for input(s): TSH, T4TOTAL, T3FREE, THYROIDAB in the last 72 hours.  Invalid input(s): FREET3  Telemetry    afib at rate of 70-80s - Personally Reviewed  ECG    n/a  Radiology    No results found.  Cardiac Studies   Previous Echo 12/2015  Impressions:  - Normal LV wall thickness  with LVEF 60-65%. Indeterminate diastolic function in the setting of atrial fibrillation. LV septal flattening noted consistent with RV pressure and volume overload. Moderate left atrial enlargement. MAC with mild mitral regurgitation. Mildly sclerotic aortic valve. Moderate to severe RV enlargement with moderately decreased contraction. Severe right atrial enlargement with elevated estimated CVP. At least moderate tricuspid regurgitation noted with evidence of severe pulmonary hypertension and PASP estimated 70 mmHg. Trivial pericardial effusion  Patient Profile     Diamond Thomas is an 19F with severe pulmonary hypertension, chronic atrial fibrillation, chronic diastolic heart failure, hypertension, and hyperlipidemia who presents with increased shortness of breath and volume overload.  Assessment & Plan   1. Acute on chronic R heart failure/acute and chronic diastolic CHF - improved with lasix gtt. Total diuresis of negative 6.7L. Wight down 13lb (124-->111lb). Continue IV lasix. Likely switch to po in 24-48 hours. Continue metolazone. Stable Scr.   2. Hypokalemia - has missed 2 dose of supplement yesterday as "refused". Likely did not given due to aspiration risk. Now on powered form. Per primary.   3. Permanent afib - Rate controlled. Continue coumadin per pharmacy.   Signed, Manson Passey, PA  04/24/2016, 9:44 AM   The patient has been seen in conjunction with Vin Bhagat, PAC. All aspects of care have been considered and discussed. The patient has been personally interviewed, examined, and all clinical data has been reviewed.   Weight on admission 124 pounds current weight 111 pounds. Discharge weight in July was 116 pounds.  Azotemia but stable creatinine.  We will convert IV diuretic to oral dosing. 80 mg by mouth is ordered twice a day.  Overall prognosis is poor. Agree with DO NOT RESUSCITATE status. Consider hospice/palliative care conversion to  comfort care  In a.m. we will check a basic metabolic panel and digoxin level.

## 2016-04-24 NOTE — Discharge Instructions (Signed)

## 2016-04-25 LAB — CBC
HCT: 35.5 % — ABNORMAL LOW (ref 36.0–46.0)
Hemoglobin: 11.4 g/dL — ABNORMAL LOW (ref 12.0–15.0)
MCH: 26.3 pg (ref 26.0–34.0)
MCHC: 32.1 g/dL (ref 30.0–36.0)
MCV: 82 fL (ref 78.0–100.0)
Platelets: 209 10*3/uL (ref 150–400)
RBC: 4.33 MIL/uL (ref 3.87–5.11)
RDW: 18.8 % — AB (ref 11.5–15.5)
WBC: 8.7 10*3/uL (ref 4.0–10.5)

## 2016-04-25 LAB — BASIC METABOLIC PANEL
Anion gap: 12 (ref 5–15)
BUN: 64 mg/dL — AB (ref 6–20)
CALCIUM: 9.3 mg/dL (ref 8.9–10.3)
CO2: 36 mmol/L — AB (ref 22–32)
CREATININE: 1.2 mg/dL — AB (ref 0.44–1.00)
Chloride: 90 mmol/L — ABNORMAL LOW (ref 101–111)
GFR calc Af Amer: 45 mL/min — ABNORMAL LOW (ref >60)
GFR, EST NON AFRICAN AMERICAN: 39 mL/min — AB (ref >60)
GLUCOSE: 122 mg/dL — AB (ref 65–99)
Potassium: 4 mmol/L (ref 3.5–5.1)
Sodium: 138 mmol/L (ref 135–145)

## 2016-04-25 LAB — PROTIME-INR
INR: 1.62
PROTHROMBIN TIME: 19.5 s — AB (ref 11.4–15.2)

## 2016-04-25 MED ORDER — POTASSIUM CHLORIDE 20 MEQ PO PACK
40.0000 meq | PACK | Freq: Once | ORAL | Status: AC
  Administered 2016-04-26: 40 meq via ORAL
  Filled 2016-04-25: qty 2

## 2016-04-25 MED ORDER — WARFARIN SODIUM 2 MG PO TABS
2.0000 mg | ORAL_TABLET | Freq: Once | ORAL | Status: AC
  Administered 2016-04-25: 2 mg via ORAL
  Filled 2016-04-25: qty 1

## 2016-04-25 MED ORDER — FUROSEMIDE 80 MG PO TABS
80.0000 mg | ORAL_TABLET | Freq: Every day | ORAL | Status: DC
  Administered 2016-04-26: 80 mg via ORAL
  Filled 2016-04-25: qty 1

## 2016-04-25 NOTE — Progress Notes (Signed)
PT Cancellation Note  Patient Details Name: Diamond Thomas MRN: [id number] DOB: 10/04/1924   Cancelled Treatment:    Reason Eval/Treat Not Completed: Fatigue/lethargy limiting ability to participate.  Pt declining PT and mobility at this time due to fatigue.  Will f/u another time.     Anea Fodera, Alison MurrayMegan F 04/25/2016, 11:06 AM

## 2016-04-25 NOTE — Progress Notes (Signed)
ANTICOAGULATION CONSULT NOTE - Follow Up Consult  Pharmacy Consult for Coumadin Indication: atrial fibrillation  Allergies  Allergen Reactions  . Pravachol [Pravastatin Sodium] Other (See Comments)    Myalgias  . Latex Itching    Patient Measurements: Height: 5\' 2"  (157.5 cm) Weight: 110 lb (49.9 kg) IBW/kg (Calculated) : 50.1  Vital Signs: Temp: 97.5 F (36.4 C) (10/25 0437) Temp Source: Oral (10/25 0437) BP: 98/47 (10/25 0437) Pulse Rate: 79 (10/25 0437)  Labs:  Recent Labs  04/23/16 0219 04/24/16 0219  LABPROT 28.7* 24.4*  INR 2.64 2.16  CREATININE 0.92 1.00    Estimated Creatinine Clearance: 30 mL/min (by C-G formula based on SCr of 1 mg/dL).   Assessment: 82 yr old female continues on warfarin as prior to admission for afib. INR supratherapeutic on admit 3.64, INR now down 1.62 after several doses were held and then resumed 10/23 again at lower dose. Hesitant to increase dose too much with labile INR. Low PO intake noted, can affect warfarin sensitivity. Also on new Methimazole - may affect INR. CBC stable, no bleed documented.  Home dose: 2mg  daily   Goal of Therapy:  INR 2-3 Monitor platelets by anticoagulation protocol: Yes   Plan:  Warfarin 2mg  x 1 dose tonight Daily INR Monitor for s/sx bleeding   Babs BertinHaley Shonte Soderlund, PharmD, BCPS Clinical Pharmacist 04/25/2016 9:33 AM

## 2016-04-25 NOTE — Progress Notes (Signed)
K of 3.4 and Scr increased to 1.20. Hold lasix and K tonight. From tomorrow Kdur 80mg  qd and Kdur 40meq qd. Continue metolazone.  Repeat BEMT in AM and final recommendation tomorrow.

## 2016-04-25 NOTE — Progress Notes (Signed)
[facility] [phone]) all arrangements have been made for home with [facility]- equipment has been delivered- per MD- plan will be to d/c tomorrow- pt will transport via- non-emergent EMS- gold DNR has been signed and is on shadow chart.  [facility] [phone] when ready, also [facility] will need to be called when pt ready for transport home- # to call [phone]

## 2016-04-25 NOTE — Progress Notes (Signed)
PROGRESS NOTE  Diamond Thomas ZOX:[id number] DOB: 05-Jan-1925 DOA: 04/17/2016 PCP: Georgianne Fick, MD  Brief Narrative: 82 y.o.femalewith medical history significant of R heart failure, long standing h/o A.Fib on coumadin and rate controlled since 2011, admitted on 10/17 with significant fluid overload and shortness of breath. Cardiology is consulted, patient is currently undergoing diuresis. Palliative care was consulted as well, plan is for patient to go home with hospice once she is medically ready  Assessment & Plan:  Acute on chronic right heart failure - continue Lasix per cardiology - weight 124 >> 119>>112 >> 112 >> 114 >> 115 >> 111>>>110 - renal function is stable, continue diuresis per cardiology.  -continue lasix 80 mg bid  CKD 2-3 -baseline creatinine 0.8-1.1 -am BMP  Acute on chronic confusion / suspect underlying dementia - mental status fluctuates - suspect contributing is also her underlying hyperthyroidism   Hypertension - continue metoprolol tartrate  Hypokalemia - due to Lasix -repleted -check mag  Dysphagia / recurrent aspiration - Patient on PPI at home, daughter is aware that she occasionally has coughing spells when she eats and has difficulties with pills at home. - SLP recommended regular diet   Permanent Atrial fibrillation - patient's CHA2DS2-VASc Score for Stroke Risk is greater than 2, anticoagulate with Coumadin per pharmacy - Continue metoprolol and digoxin -10/24 digoxin level 1.1  Goals of care discussion - Discussed with daughter over the phone, patient has expressed her wishes in the past and reiterated full CODE STATUS. Palliative care was consulted, and CODE STATUS were readdressed, she is currently DO NOT RESUSCITATE and plans to return home with hospice services to follow  Hyperthyroidism - Patient's TSH was undetectable and free T4 was elevated to 3.3 - Supposed to get thyroid scan as an outpatient - Her  A. fib is currently well controlled - started methimazole. Will need repeat TSH/Free T4 as an outpatient   Chronic hypoxic respiratory failure - Continue home oxygen 2 L nasal cannula, respiratory status stable after aspiration without need for increased oxygen requirements  Severe protein calorie malnutrition   DVT prophylaxis: Coumadin Code Status: Full code Family Communication: no family at bedside today, discussed with daughter over the phone on 10/23 Disposition Plan: Home with hospice 04/26/16 if stable  Consultants:   Cardiology  Palliative  Procedures:   None   Antimicrobials:  None      Subjective: Patient denies fevers, chills, headache, chest pain, dyspnea, nausea, vomiting, diarrhea, abdominal pain, dysuria, hematuria, hematochezia, and melena.   Objective: Vitals:   04/24/16 2227 04/25/16 0230 04/25/16 0437 04/25/16 0953  BP: (!) 90/56  (!) 98/47 133/75  Pulse: 78  79 78  Resp: 18  16   Temp: 97.5 F (36.4 C)  97.5 F (36.4 C)   TempSrc: Oral  Oral   SpO2: 95%  96%   Weight:  49.9 kg (110 lb)    Height:        Intake/Output Summary (Last 24 hours) at 04/25/16 1254 Last data filed at 04/25/16 8119  Gross per 24 hour  Intake              480 ml  Output              900 ml  Net             -420 ml   Weight change: -0.771 kg (-1 lb 11.2 oz) Exam:   General:  Pt is alert, follows commands  appropriately, not in acute distress  HEENT: No icterus, No thrush, No neck mass, [place]/AT  Cardiovascular: RRR, S1/S2, no rubs, no gallops  Respiratory: CTA bilaterally, no wheezing, no crackles, no rhonchi  Abdomen: Soft/+BS, non tender, non distended, no guarding  Extremities: No edema, No lymphangitis, No petechiae, No rashes, no synovitis   Data Reviewed: I have personally reviewed following labs and imaging studies Basic Metabolic Panel:  Recent Labs Lab 04/20/16 1603 04/21/16 0436 04/21/16 1021 04/22/16 0253 04/23/16 0219  04/24/16 0219  NA 139 138  --  141 140 137  K 3.1* 3.2*  --  3.0* 3.9 2.8*  CL 99* 95*  --  93* 91* 88*  CO2 27 32  --  36* 37* 36*  GLUCOSE 128* 100*  --  123* 118* 116*  BUN 46* 50*  --  53* 53* 56*  CREATININE 0.94 0.94  --  0.92 0.92 1.00  CALCIUM 8.7* 8.7*  --  8.9 9.2 9.0  MG  --   --  2.0  --   --   --    Liver Function Tests:  Recent Labs Lab 04/21/16 0436  AST 36  ALT 18  ALKPHOS 139*  BILITOT 1.4*  PROT 6.3*  ALBUMIN 2.7*   No results for input(s): LIPASE, AMYLASE in the last 168 hours. No results for input(s): AMMONIA in the last 168 hours. Coagulation Profile:  Recent Labs Lab 04/21/16 0436 04/22/16 0253 04/23/16 0219 04/24/16 0219 04/25/16 1002  INR 2.93 3.17 2.64 2.16 1.62   CBC:  Recent Labs Lab 04/19/16 0221 04/20/16 0413 04/21/16 0436 04/25/16 1218  WBC 7.1 8.2 7.0 8.7  HGB 11.2* 11.2* 10.9* 11.4*  HCT 34.7* 35.0* 34.5* 35.5*  MCV 80.7 81.2 81.9 82.0  PLT 224 219 200 209   Cardiac Enzymes: No results for input(s): CKTOTAL, CKMB, CKMBINDEX, TROPONINI in the last 168 hours. BNP: Invalid input(s): POCBNP CBG: No results for input(s): GLUCAP in the last 168 hours. HbA1C: No results for input(s): HGBA1C in the last 72 hours. Urine analysis:    Component Value Date/Time   COLORURINE YELLOW 01/27/2016 1253   APPEARANCEUR CLEAR 01/27/2016 1253   LABSPEC 1.011 01/27/2016 1253   PHURINE 6.5 01/27/2016 1253   GLUCOSEU NEGATIVE 01/27/2016 1253   HGBUR NEGATIVE 01/27/2016 1253   BILIRUBINUR NEGATIVE 01/27/2016 1253   KETONESUR NEGATIVE 01/27/2016 1253   PROTEINUR NEGATIVE 01/27/2016 1253   UROBILINOGEN 0.2 10/03/2014 1657   NITRITE NEGATIVE 01/27/2016 1253   LEUKOCYTESUR NEGATIVE 01/27/2016 1253   Sepsis Labs: @LABRCNTIP (procalcitonin:4,lacticidven:4) )No results found for this or any previous visit (from the past 240 hour(s)).   Scheduled Meds: . digoxin  0.125 mg Oral Daily  . furosemide  80 mg Oral BID  . loratadine  10 mg Oral  Daily  . magnesium oxide  200 mg Oral Daily  . methimazole  5 mg Oral BID  . metolazone  2.5 mg Oral QPC breakfast  . metoprolol tartrate  25 mg Oral BID  . pantoprazole  40 mg Oral Daily  . potassium chloride  40 mEq Oral TID  . sodium chloride flush  3 mL Intravenous Q12H  . warfarin  2 mg Oral ONCE-1800  . Warfarin - Pharmacist Dosing Inpatient   Does not apply q1800   Continuous Infusions:   Procedures/Studies: Dg Chest Portable 1 View  Result Date: 04/17/2016 CLINICAL DATA:  Shortness of breath. EXAM: PORTABLE CHEST 1 VIEW COMPARISON:  Radiograph of January 27, 2016. FINDINGS: Stable cardiomegaly. Atherosclerosis of thoracic aorta is  noted. Increased bilateral lung opacities are noted, right greater than left. This is concerning for edema or pneumonia or atelectasis with associated pleural effusions. No pneumothorax is noted. Bony thorax is unremarkable. IMPRESSION: Aortic atherosclerosis. Increased bilateral lung opacities are noted concerning for worsening edema, pneumonia or atelectasis with associated pleural effusions, right greater than left. Electronically Signed   By: Lupita RaiderJames  Green Jr, M.D.   On: 04/17/2016 17:36    Aristea Posada, DO  [facility] Pager [phone]  If 7PM-7AM, please contact night-coverage [email or web address] Password [email or web address] 04/25/2016, 12:54 PM   LOS: 7 days

## 2016-04-25 NOTE — Progress Notes (Addendum)
The patient has been seen in conjunction with Diamond Thomas, PAC. All aspects of care have been considered and discussed. The patient has been personally interviewed, examined, and all clinical data has been reviewed.   Frail 82 year old with end-stage chronic diastolic heart failure and right heart failure who presented with anasarca. She has had a dramatic diuresis. Diuretic therapy was switched from IV to by mouth yesterday.  Potassium was low yesterday, despite potassium supplementation.  We'll check basic metabolic panel immediately.  May need to hold diuretic therapy if potassium is still extremely low or there is evidence of increasing azotemia.  Digoxin level 1.1 yesterday.    Patient Name: Diamond Thomas Date of Encounter: 04/25/2016  Primary Cardiologist: Diamond Thomas  Hospital Problem List     Principal Problem:   Anasarca Active Problems:   Essential hypertension   Chronic atrial fibrillation [facility](HCC):  CHA2DS2-VASc Score 5, On Warfarin   Palliative care encounter   Acute on chronic diastolic CHF (congestive heart failure) (HCC)   Acute on chronic congestive heart failure (HCC)   Moderate to severe pulmonary hypertension   Pressure injury of skin     Subjective   Stable dyspnea on supplemental oxygen. Complains of unable to sleep due to uncomfortable bed.   Inpatient Medications    Scheduled Meds: . digoxin  0.125 mg Oral Daily  . furosemide  80 mg Oral BID  . loratadine  10 mg Oral Daily  . magnesium oxide  200 mg Oral Daily  . methimazole  5 mg Oral BID  . metolazone  2.5 mg Oral QPC breakfast  . metoprolol tartrate  25 mg Oral BID  . pantoprazole  40 mg Oral Daily  . potassium chloride  40 mEq Oral TID  . sodium chloride flush  3 mL Intravenous Q12H  . Warfarin - Pharmacist Dosing Inpatient   Does not apply q1800   Continuous Infusions:   PRN Meds: sodium chloride, acetaminophen, guaiFENesin, ipratropium-albuterol, magnesium hydroxide,  menthol-cetylpyridinium, morphine CONCENTRATE, ondansetron (ZOFRAN) IV, phenol, sodium chloride flush   Vital Signs    Vitals:   04/24/16 1346 04/24/16 2227 04/25/16 0230 04/25/16 0437  BP: 128/64 (!) 90/56  (!) 98/47  Pulse: 75 78  79  Resp: 17 18  16   Temp: 97.7 F (36.5 C) 97.5 F (36.4 C)  97.5 F (36.4 C)  TempSrc: Oral Oral  Oral  SpO2: 95% 95%  96%  Weight:   110 lb (49.9 kg)   Height:        Intake/Output Summary (Last 24 hours) at 04/25/16 0943 Last data filed at 04/25/16 0651  Gross per 24 hour  Intake              360 ml  Output              900 ml  Net             -540 ml   Filed Weights   04/23/16 0433 04/24/16 0434 04/25/16 0230  Weight: 115 lb 12.8 oz (52.5 kg) 111 lb 11.2 oz (50.7 kg) 110 lb (49.9 kg)    Physical Exam   GEN: thin frail female  in no acute distress.  HEENT: Grossly normal.  Neck: Supple, carotid bruits, or masses.  JVD Cardiac: Ir Ir with systolic murmur rubs, or gallops. No clubbing, cyanosis. TNo  edema.  Radials/DP/PT 2+ and equal bilaterally.  Respiratory:  Respirations regular and unlabored, clear to auscultation bilaterally. On nasal cannula GI: Soft, nontender, nondistended, BS +  x 4. MS: no deformity or atrophy. Skin: warm and dry, no rash. Neuro:  Strength and sensation are intact. Psych: AAOx3.  Normal affect.  Labs    CBC No results for input(s): WBC, NEUTROABS, HGB, HCT, MCV, PLT in the last 72 hours. Basic Metabolic Panel  Recent Labs  04/23/16 0219 04/24/16 0219  NA 140 137  K 3.9 2.8*  CL 91* 88*  CO2 37* 36*  GLUCOSE 118* 116*  BUN 53* 56*  CREATININE 0.92 1.00  CALCIUM 9.2 9.0   Liver Function Tests No results for input(s): AST, ALT, ALKPHOS, BILITOT, PROT, ALBUMIN in the last 72 hours. No results for input(s): LIPASE, AMYLASE in the last 72 hours. Cardiac Enzymes No results for input(s): CKTOTAL, CKMB, CKMBINDEX, TROPONINI in the last 72 hours. BNP Invalid input(s): POCBNP D-Dimer No results for  input(s): DDIMER in the last 72 hours. Hemoglobin A1C No results for input(s): HGBA1C in the last 72 hours. Fasting Lipid Panel No results for input(s): CHOL, HDL, LDLCALC, TRIG, CHOLHDL, LDLDIRECT in the last 72 hours. Thyroid Function Tests No results for input(s): TSH, T4TOTAL, T3FREE, THYROIDAB in the last 72 hours.  Invalid input(s): FREET3  Telemetry    afib at rate of 70s - Personally Reviewed  ECG    n/a  Radiology    No results found.  Cardiac Studies   Previous Echo 12/2015  Impressions:  - Normal LV wall thickness with LVEF 60-65%. Indeterminate diastolic function in the setting of atrial fibrillation. LV septal flattening noted consistent with RV pressure and volume overload. Moderate left atrial enlargement. MAC with mild mitral regurgitation. Mildly sclerotic aortic valve. Moderate to severe RV enlargement with moderately decreased contraction. Severe right atrial enlargement with elevated estimated CVP. At least moderate tricuspid regurgitation noted with evidence of severe pulmonary hypertension and PASP estimated 70 mmHg. Trivial pericardial effusion  Patient Profile     Diamond Thomas is an 32F with severe pulmonary hypertension, chronic atrial fibrillation, chronic diastolic heart failure, hypertension, and hyperlipidemia who presents with increased shortness of breath and volume overload.  Assessment & Plan   1. Acute on chronic R heart failure/acute and chronic diastolic CHF - Initially diuresed with llasix gtt now on lasix 80mg  po BID.  Total diuresis of negative 7.3L. Wight down 14lb (124-->110lb). Continue metolazone. Pending BMET this morning.   2. Hypokalemia - now receiving supplemental K in powered from due to likely dysphagia.    3. Permanent afib - Rate controlled. Continue coumadin per pharmacy. Normal digoxin level.  Dispo: Pending BMET this morning. Will sign off. MD to decided daily diuretics. Will need powered form  of supplemental K at home. Likely home hospice. F/u with PCP. Per Dr. Herbie Thomas "I do not want to be her Diamond for hospice".   Diamond Pont, PA  04/25/2016, 9:43 AM

## 2016-04-26 ENCOUNTER — Other Ambulatory Visit: Payer: Self-pay | Admitting: Cardiology

## 2016-04-26 LAB — BASIC METABOLIC PANEL
ANION GAP: 12 (ref 5–15)
BUN: 67 mg/dL — ABNORMAL HIGH (ref 6–20)
CALCIUM: 9.1 mg/dL (ref 8.9–10.3)
CHLORIDE: 90 mmol/L — AB (ref 101–111)
CO2: 35 mmol/L — AB (ref 22–32)
Creatinine, Ser: 1.17 mg/dL — ABNORMAL HIGH (ref 0.44–1.00)
GFR calc non Af Amer: 40 mL/min — ABNORMAL LOW (ref >60)
GFR, EST AFRICAN AMERICAN: 46 mL/min — AB (ref >60)
Glucose, Bld: 108 mg/dL — ABNORMAL HIGH (ref 65–99)
Potassium: 3.4 mmol/L — ABNORMAL LOW (ref 3.5–5.1)
Sodium: 137 mmol/L (ref 135–145)

## 2016-04-26 LAB — PROTIME-INR
INR: 1.6
PROTHROMBIN TIME: 19.3 s — AB (ref 11.4–15.2)

## 2016-04-26 LAB — MAGNESIUM: MAGNESIUM: 2.3 mg/dL (ref 1.7–2.4)

## 2016-04-26 MED ORDER — METOLAZONE 2.5 MG PO TABS: 2.5000 mg | ORAL_TABLET | Freq: Every day | ORAL | 0 refills | Status: AC

## 2016-04-26 MED ORDER — POTASSIUM CHLORIDE CRYS ER 20 MEQ PO TBCR: EXTENDED_RELEASE_TABLET | ORAL | 0 refills | Status: AC

## 2016-04-26 MED ORDER — FUROSEMIDE 80 MG PO TABS: 80.0000 mg | ORAL_TABLET | Freq: Every day | ORAL | 0 refills | Status: AC

## 2016-04-26 MED ORDER — FUROSEMIDE 40 MG PO TABS: 40.0000 mg | ORAL_TABLET | Freq: Every day | ORAL | 0 refills | Status: AC | PRN

## 2016-04-26 MED ORDER — WARFARIN SODIUM 2 MG PO TABS
ORAL_TABLET | ORAL | 0 refills | Status: AC
Start: 2016-04-26 — End: ?

## 2016-04-26 MED ORDER — METHIMAZOLE 5 MG PO TABS: 5.0000 mg | ORAL_TABLET | Freq: Two times a day (BID) | ORAL | 0 refills | Status: AC

## 2016-04-26 NOTE — Discharge Summary (Addendum)
Physician Discharge Summary  Diamond PeltHazel O Yusko ZOX:[id number] DOB: 05/04/1925 DOA: 04/17/2016  PCP: Georgianne FickAMACHANDRAN,AJITH, MD  Admit date: 04/17/2016 Discharge date: 04/26/2016  Admitted From: Home Disposition:  Home with Hospice  Recommendations for Outpatient Follow-up:  1. Follow up with PCP in 1-2 weeks 2. Please obtain BMP/CBC on 2016-01-29 3. Please obtain INR on 2016-01-29 and adjust coumadin dosing for INR 2-3  Home Health:YES Equipment/Devices: Home Hospice  Discharge Condition:stable CODE STATUS:DNR Diet recommendation: Heart Healthy    Brief/Interim Summary:  82 y.o.femalewith medical history significant of R heart failure, long standing h/o A.Fib on coumadin and rate controlled since 2011, admitted on 10/17 with significant fluid overload and shortness of breath.Cardiology is consulted, patient is currently undergoing diuresis. Palliative care was consulted as well, plan is for patient to go home with hospice once she is medically ready.  Discharge Diagnoses:  Acute on chronic diastolic (right) heart failure - continue Lasix per cardiology - weight 124 >>119>>112 >>112 >>114 >>115 >> 111>>>110>>109 - renal function is stable, continue diuresisper cardiology.  -continue lasix 80 mg once daily after d/c -neg 7.1 L for the admission -take extra lasix 40 mg po daily prn SOB with additional 20 KCl  CKD 2-3 -baseline creatinine 0.8-1.1 -am BMP  Acute on chronic confusion / suspect underlying dementia - mental status fluctuates - suspect contributing is also her underlying hyperthyroidism   Hypertension - continue metoprolol tartrate  Hypokalemia - due to Lasix -repleted -check mag--2.3 -take KCl 40 mEq in am and 20 mEq in pm daily -take extra 20 mEq if taking additional lasix  Dysphagia / recurrent aspiration - Patient on PPI at home, daughter is aware that she occasionally has coughing spells when she eats and has difficulties with pills at home. -  SLP recommended regular diet   Permanent Atrial fibrillation - patient's CHA2DS2-VASc Score for Stroke Risk is greater than 2, anticoagulate with Coumadin per pharmacy - Continue metoprolol and digoxin -10/24 digoxin level 1.1 -home with warfarin 2 mg daily on T-Th-Sat-Sun and 1 mg on Mon-Wed-Fri -repeat INR on 2016-01-29  Goals of care discussion - Discussed with daughter over the phone, patient has expressed her wishes in the past and reiterated full CODE STATUS. Palliative care was consulted, and CODE STATUS were readdressed, she is currently DO NOT RESUSCITATE and plans to return home with hospice services to follow  Hyperthyroidism - Patient's TSH was undetectable and free T4 was elevated to 3.3 - Supposed to get thyroid scan as an outpatient - Her A. fib is currently well controlled - started methimazole. Will need repeat TSH/Free T4 as an outpatient in one month  Chronic hypoxic respiratory failure - Continue home oxygen 2 L nasal cannula, respiratory status stable after aspiration without need for increased oxygen requirements  Severe protein calorie malnutrition  Discharge Instructions  Discharge Instructions    Diet - low sodium heart healthy    Complete by:  As directed    Increase activity slowly    Complete by:  As directed        Medication List    TAKE these medications   CALCIUM 600 + D PO Take 1 tablet by mouth 2 (two) times daily.   digoxin 0.125 MG tablet Commonly known as:  LANOXIN Take 1 tablet (0.125 mg total) by mouth daily.   Fish Oil 1000 MG Caps Take 1 capsule by mouth 2 (two) times daily.   furosemide 40 MG tablet Commonly known as:  LASIX Take 1 tablet (40 mg total) by mouth  daily as needed. For worsen dyspnea What changed:  You were already taking a medication with the same name, and this prescription was added. Make sure you understand how and when to take each.   furosemide 80 MG tablet Commonly known as:  LASIX Take 1 tablet (80  mg total) by mouth daily. Start taking on:  04/27/2016 What changed:  when to take this   loratadine 10 MG tablet Commonly known as:  CLARITIN Take 10 mg by mouth daily as needed for allergies.   Magnesium 250 MG Tabs Take 1 tablet by mouth daily.   methimazole 5 MG tablet Commonly known as:  TAPAZOLE Take 1 tablet (5 mg total) by mouth 2 (two) times daily.   metolazone 2.5 MG tablet Commonly known as:  ZAROXOLYN Take 1 tablet (2.5 mg total) by mouth daily after breakfast. Start taking on:  04/27/2016 What changed:  when to take this  additional instructions   metoprolol tartrate 25 MG tablet Commonly known as:  LOPRESSOR Take 1 tablet (25 mg total) by mouth 2 (two) times daily.   NITROSTAT 0.4 MG SL tablet Generic drug:  nitroGLYCERIN TAKE 1 TABLET UNDER TONGUE EVERY 5 MINUTES AS NEEDED FOR CHEST PAIN   OXYGEN Inhale 2 L into the lungs continuous. DX: Hypoxia   potassium chloride SA 20 MEQ tablet Commonly known as:  K-DUR,KLOR-CON Take 40 mEq (2 tabs) in morning and 20 mEq (1 tab) in evening daily What changed:  how much to take  how to take this  when to take this  additional instructions   TYLENOL EXTRA STRENGTH PO Take 500 mg by mouth daily as needed (PAIN).   warfarin 2 MG tablet Commonly known as:  COUMADIN Take 2 mg (1 tablet) on Tues-Thurs-Sat-Sun and take 1 mg (half tab) on Mon-Wed-Fri What changed:  how much to take  how to take this  when to take this  additional instructions       Allergies  Allergen Reactions  . Pravachol [Pravastatin Sodium] Other (See Comments)    Myalgias  . Latex Itching    Consultations:  Cardiology  Palliative medicine   Procedures/Studies: Dg Chest Portable 1 View  Result Date: 04/17/2016 CLINICAL DATA:  Shortness of breath. EXAM: PORTABLE CHEST 1 VIEW COMPARISON:  Radiograph of January 27, 2016. FINDINGS: Stable cardiomegaly. Atherosclerosis of thoracic aorta is noted. Increased bilateral lung  opacities are noted, right greater than left. This is concerning for edema or pneumonia or atelectasis with associated pleural effusions. No pneumothorax is noted. Bony thorax is unremarkable. IMPRESSION: Aortic atherosclerosis. Increased bilateral lung opacities are noted concerning for worsening edema, pneumonia or atelectasis with associated pleural effusions, right greater than left. Electronically Signed   By: Lupita Raider, M.D.   On: 04/17/2016 17:36        Discharge Exam: Vitals:   04/25/16 2356 04/26/16 0413  BP: (!) 98/54 (!) 96/40  Pulse: 87 85  Resp:  18  Temp:  97.9 F (36.6 C)   Vitals:   04/25/16 2116 04/25/16 2356 04/26/16 0407 04/26/16 0413  BP: (!) 97/33 (!) 98/54  (!) 96/40  Pulse: 61 87  85  Resp: 18   18  Temp: 98.4 F (36.9 C)   97.9 F (36.6 C)  TempSrc: Oral   Oral  SpO2: 95%   95%  Weight:   49.6 kg (109 lb 4.8 oz)   Height:        General: Pt is alert, awake, not in acute distress Cardiovascular: IRRR,  S1/S2 +, no rubs, no gallops Respiratory: CTA bilaterally, no wheezing, no rhonchi Abdominal: Soft, NT, ND, bowel sounds + Extremities: no edema, no cyanosis   The results of significant diagnostics from this hospitalization (including imaging, microbiology, ancillary and laboratory) are listed below for reference.    Significant Diagnostic Studies: Dg Chest Portable 1 View  Result Date: 04/17/2016 CLINICAL DATA:  Shortness of breath. EXAM: PORTABLE CHEST 1 VIEW COMPARISON:  Radiograph of January 27, 2016. FINDINGS: Stable cardiomegaly. Atherosclerosis of thoracic aorta is noted. Increased bilateral lung opacities are noted, right greater than left. This is concerning for edema or pneumonia or atelectasis with associated pleural effusions. No pneumothorax is noted. Bony thorax is unremarkable. IMPRESSION: Aortic atherosclerosis. Increased bilateral lung opacities are noted concerning for worsening edema, pneumonia or atelectasis with associated  pleural effusions, right greater than left. Electronically Signed   By: Lupita Raider, M.D.   On: 04/17/2016 17:36     Microbiology: No results found for this or any previous visit (from the past 240 hour(s)).   Labs: Basic Metabolic Panel:  Recent Labs Lab 04/21/16 1021 04/22/16 0253 04/23/16 0219 04/24/16 0219 04/25/16 1218 04/26/16 0155  NA  --  141 140 137 138 137  K  --  3.0* 3.9 2.8* 4.0 3.4*  CL  --  93* 91* 88* 90* 90*  CO2  --  36* 37* 36* 36* 35*  GLUCOSE  --  123* 118* 116* 122* 108*  BUN  --  53* 53* 56* 64* 67*  CREATININE  --  0.92 0.92 1.00 1.20* 1.17*  CALCIUM  --  8.9 9.2 9.0 9.3 9.1  MG 2.0  --   --   --   --  2.3   Liver Function Tests:  Recent Labs Lab 04/21/16 0436  AST 36  ALT 18  ALKPHOS 139*  BILITOT 1.4*  PROT 6.3*  ALBUMIN 2.7*   No results for input(s): LIPASE, AMYLASE in the last 168 hours. No results for input(s): AMMONIA in the last 168 hours. CBC:  Recent Labs Lab 04/20/16 0413 04/21/16 0436 04/25/16 1218  WBC 8.2 7.0 8.7  HGB 11.2* 10.9* 11.4*  HCT 35.0* 34.5* 35.5*  MCV 81.2 81.9 82.0  PLT 219 200 209   Cardiac Enzymes: No results for input(s): CKTOTAL, CKMB, CKMBINDEX, TROPONINI in the last 168 hours. BNP: Invalid input(s): POCBNP CBG: No results for input(s): GLUCAP in the last 168 hours.  Time coordinating discharge:  Greater than 30 minutes  Signed:  Grady Mohabir, DO [facility] Pager: [phone] 04/26/2016, 12:14 PM

## 2016-04-26 NOTE — Progress Notes (Signed)
OT Cancellation Note  Patient Details Name: Diamond MohairHazel O Thomas MRN: [id number] DOB: 11/14/1924   Cancelled Treatment:    Reason Eval/Treat Not Completed: Fatigue/lethargy limiting ability to participate.  Skyler Dusing Fabricaonarpe, OTR/L [phone]  Jeani HawkingConarpe, Selena Swaminathan M 04/26/2016, 11:48 AM

## 2016-04-26 NOTE — Progress Notes (Signed)
Foley catheter in place. Pt possible discharge to home with hospice care today.  She has been requiring three assist to use BSC, has a very difficult time using bedpan, has skin breakdown on her sacral area and still receiving PO lasix.  Per hospice RN, she is going to consult with family on their preference for pt going home with/without foley.

## 2016-04-26 NOTE — Progress Notes (Signed)
Per Reed BreechSue Thomas, Hospice RN, she has spoken with the family regarding foley upon D/C to home.  Family would like to continue foley use at this time.  Dr. Arbutus Leasat notified.

## 2016-04-26 NOTE — Progress Notes (Signed)
Called by RN regarding patient's foley catheter.  Hospice RN Reed BreechSue Thomas spoke with family regarding foley catheter in the setting of her very poor mobility and continued furosemide and risks including but not limited to infection/UTI.  At this time, pt's family would like to keep foley as a comfort measure.  Therefore, will keep foley at time of d/c and plan to change foley at least once every 30 days with next change on 05/16/16.  DTat

## 2016-04-26 NOTE — Care Management Note (Signed)
Case Management Note Donn PieriniKristi Terralyn Matsumura RN, BSN Unit 2W-Case Manager [phone]  Patient Details  Name: Diamond MohairHazel O Thomas MRN: [id number] Date of Birth: 07/31/1924  Subjective/Objective:  Pt admitted with HF                   Action/Plan: PTA pt lived at home- [facility]- meeting planned with family for 10/20- CM to follow for d/c needs- CSW also following for possible placement needs- PT/OT evals pending  Expected Discharge Date:     04/26/16             Expected Discharge Plan:  Home w Hospice Care  In-House Referral:  Clinical Social Work  Discharge planning Services  CM Consult  Post Acute Care Choice:  Hospice Choice offered to:  Patient, Adult Children  DME Arranged:  Hospital bed DME Agency:  [facility]  HH Arranged:  Disease Management HH Agency:  [facility]  Status of Service:  Completed, signed off  If discussed at Long Length of Stay Meetings, dates discussed:    Additional Comments:  04/26/16- 1415- Donn PieriniKristi Chanie Soucek RN, CM- pt ready for discharge- address has  Been confirmed with daughter-[facility], [facility] called at 1415 for transport- and [facility] referral line called to let them know pt was ready to d/c home. No further CM needs.   04/25/16- 1530- Donn PieriniKristi Anaika Santillano RN, CM- [facility] [phone]) all arrangements have been made for home with [facility]- equipment has been delivered- per MD- plan will be to d/c tomorrow- pt will transport via- non-emergent EMS- gold DNR has been signed and is on shadow chart.  Pt [facility] [phone] when ready, also [facility] will need to be called when pt ready for transport home- # to call [phone]   10/24- per weekend CM- home hospice choice offered- and [facility]  Darrold SpanWebster, Paxton Kanaan Hall, RN 04/26/2016, 2:16 PM

## 2016-04-26 NOTE — Progress Notes (Signed)
Patient Name: Diamond Thomas Date of Encounter: 04/26/2016    The patient has been seen in conjunction with Diamond Thomas, PAC. All aspects of care have been considered and discussed. The patient has been personally interviewed, examined, and all clinical data has been reviewed.   Patient is frail and at the end of life. Hospice and palliative care are in place.  Agree with details of this note.  We will sign off.  Primary Cardiologist: Dr. Kristeen Thomas Problem List     Principal Problem:   Anasarca Active Problems:   Essential hypertension   Chronic atrial fibrillation [facility]):  CHA2DS2-VASc Score 5, On Warfarin   Palliative care encounter   Acute on chronic diastolic CHF (congestive heart failure) (HCC)   Acute on chronic congestive heart failure (HCC)   Moderate to severe pulmonary hypertension   Pressure injury of skin    Subjective   Comfortably eating breakfast. No complains.   Inpatient Medications    Scheduled Meds: . digoxin  0.125 mg Oral Daily  . furosemide  80 mg Oral Daily  . loratadine  10 mg Oral Daily  . magnesium oxide  200 mg Oral Daily  . methimazole  5 mg Oral BID  . metolazone  2.5 mg Oral QPC breakfast  . metoprolol tartrate  25 mg Oral BID  . pantoprazole  40 mg Oral Daily  . potassium chloride  40 mEq Oral Once  . sodium chloride flush  3 mL Intravenous Q12H  . Warfarin - Pharmacist Dosing Inpatient   Does not apply q1800   Continuous Infusions:   PRN Meds: sodium chloride, acetaminophen, guaiFENesin, ipratropium-albuterol, magnesium hydroxide, menthol-cetylpyridinium, morphine CONCENTRATE, ondansetron (ZOFRAN) IV, phenol, sodium chloride flush   Vital Signs    Vitals:   04/25/16 2116 04/25/16 2356 04/26/16 0407 04/26/16 0413  BP: (!) 97/33 (!) 98/54  (!) 96/40  Pulse: 61 87  85  Resp: 18   18  Temp: 98.4 F (36.9 C)   97.9 F (36.6 C)  TempSrc: Oral   Oral  SpO2: 95%   95%  Weight:   109 lb 4.8 oz (49.6 kg)   Height:         Intake/Output Summary (Last 24 hours) at 04/26/16 0855 Last data filed at 04/25/16 1800  Gross per 24 hour  Intake              360 ml  Output              202 ml  Net              158 ml   Filed Weights   04/24/16 0434 04/25/16 0230 04/26/16 0407  Weight: 111 lb 11.2 oz (50.7 kg) 110 lb (49.9 kg) 109 lb 4.8 oz (49.6 kg)    Physical Exam   GEN: Thin frail female  in no acute distress.  HEENT: Grossly normal.  Neck: Supple, carotid bruits, or masses. No  JVD Cardiac: Ir Ir with systolic murmur rubs, or gallops. No clubbing, cyanosis. Trace BL LE edema.  Radials/DP/PT 2+ and equal bilaterally.  Respiratory:  Respirations regular and unlabored, clear to auscultation bilaterally. On nasal cannula GI: Soft, nontender, nondistended, BS + x 4. MS: no deformity or atrophy. Skin: warm and dry, no rash. Neuro:  Strength and sensation are intact. Psych: AAOx3.  Normal affect.  Labs    CBC  Recent Labs  04/25/16 1218  WBC 8.7  HGB 11.4*  HCT 35.5*  MCV 82.0  PLT 209   Basic Metabolic Panel  Recent Labs  04/25/16 1218 04/26/16 0155  NA 138 137  K 4.0 3.4*  CL 90* 90*  CO2 36* 35*  GLUCOSE 122* 108*  BUN 64* 67*  CREATININE 1.20* 1.17*  CALCIUM 9.3 9.1  MG  --  2.3   Liver Function Tests No results for input(s): AST, ALT, ALKPHOS, BILITOT, PROT, ALBUMIN in the last 72 hours. No results for input(s): LIPASE, AMYLASE in the last 72 hours. Cardiac Enzymes No results for input(s): CKTOTAL, CKMB, CKMBINDEX, TROPONINI in the last 72 hours. BNP Invalid input(s): POCBNP D-Dimer No results for input(s): DDIMER in the last 72 hours. Hemoglobin A1C No results for input(s): HGBA1C in the last 72 hours. Fasting Lipid Panel No results for input(s): CHOL, HDL, LDLCALC, TRIG, CHOLHDL, LDLDIRECT in the last 72 hours. Thyroid Function Tests No results for input(s): TSH, T4TOTAL, T3FREE, THYROIDAB in the last 72 hours.  Invalid input(s): FREET3  Telemetry    afib at  rate of 70s - Personally Reviewed  ECG    n/a  Radiology    No results found.  Cardiac Studies   Previous Echo 12/2015  Impressions:  - Normal LV wall thickness with LVEF 60-65%. Indeterminate diastolic function in the setting of atrial fibrillation. LV septal flattening noted consistent with RV pressure and volume overload. Moderate left atrial enlargement. MAC with mild mitral regurgitation. Mildly sclerotic aortic valve. Moderate to severe RV enlargement with moderately decreased contraction. Severe right atrial enlargement with elevated estimated CVP. At least moderate tricuspid regurgitation noted with evidence of severe pulmonary hypertension and PASP estimated 70 mmHg. Trivial pericardial effusion  Patient Profile     Diamond Thomas is an [age]F with severe pulmonary hypertension, chronic atrial fibrillation, chronic diastolic heart failure, hypertension, and hyperlipidemia who presents with increased shortness of breath and volume overload.  Assessment & Plan   1. Acute on chronic R heart failure/acute and chronic diastolic CHF - Initially diuresed with lV asix gtt --> lasix 80mg  po BID with worsen renal function. --> improved today on Lasix 80mg  qd. Total diuresis of negative 7.1L. Yesterday net I & O was positive 278. However weight down further 1lb.Total weight loss 15lb (124-->109lb).   2. Hypokalemia - Difficult to control.   3. Permanent afib - Rate controlled. Continue coumadin per pharmacy. Normal digoxin level. INR check with PCP.   Dispo: She can be discharged on  Metolazone 2.5mg  qd. Lasix 80mg  qd. Kdur 40meq AM and 20meq PM. She can take extra lasix 40mg  for edema or dyspnea and that time take extra 20meq Kdur. MD to decide final recommendations.   Signed, Diamond Thomas,Bhavinkumar, PA  04/26/2016, 8:55 AM

## 2016-04-27 ENCOUNTER — Telehealth: Payer: Self-pay | Admitting: Cardiology

## 2016-04-27 NOTE — Telephone Encounter (Signed)
Rx(s) sent to pharmacy electronically.  

## 2016-04-27 NOTE — Telephone Encounter (Signed)
This is exactly the type of issue that I do not want to have to handle. I have not seen her since the hospital stay & am not really sure what was the final outcome prior to d/c.  Sounds like dysphagia - maybe needs to take meds with applesauce.  Hospice should be a PCP managed care.  Bryan Lemmaavid Kamyla Olejnik, MD

## 2016-04-27 NOTE — Telephone Encounter (Signed)
Spoke to [place]. Expresses concern that patient has trouble swallowing - notes this is an ongoing issue - and is unable to take most of her meds due to this. Asked if she was aware that Dr. Herbie BaltimoreHarding requested her PCP to assume care as hospice attending, she acknowledged. I explained he is not in the office today. I asked if anything I can have DoD address - she notes no acute cardiology concerns - she voiced that she will call PCP office back for recommendations on med administration/changes.

## 2016-04-27 NOTE — Telephone Encounter (Signed)
Diamond Thomas with Hospice calling stating patient having difficulty swallowing -pls call [phone]

## 2016-05-02 DEATH — deceased

## 2016-05-07 ENCOUNTER — Ambulatory Visit: Payer: Medicare Other | Admitting: Cardiology
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
## 2017-09-30 IMAGING — DX DG CHEST 1V PORT
1 series · 1 of 1 positions shown · non-contrast
Comparison: Radiographs October 03, 2014.

CLINICAL DATA: Shortness of breath on exertion.

EXAM:
PORTABLE CHEST 1 VIEW

[chest ap]
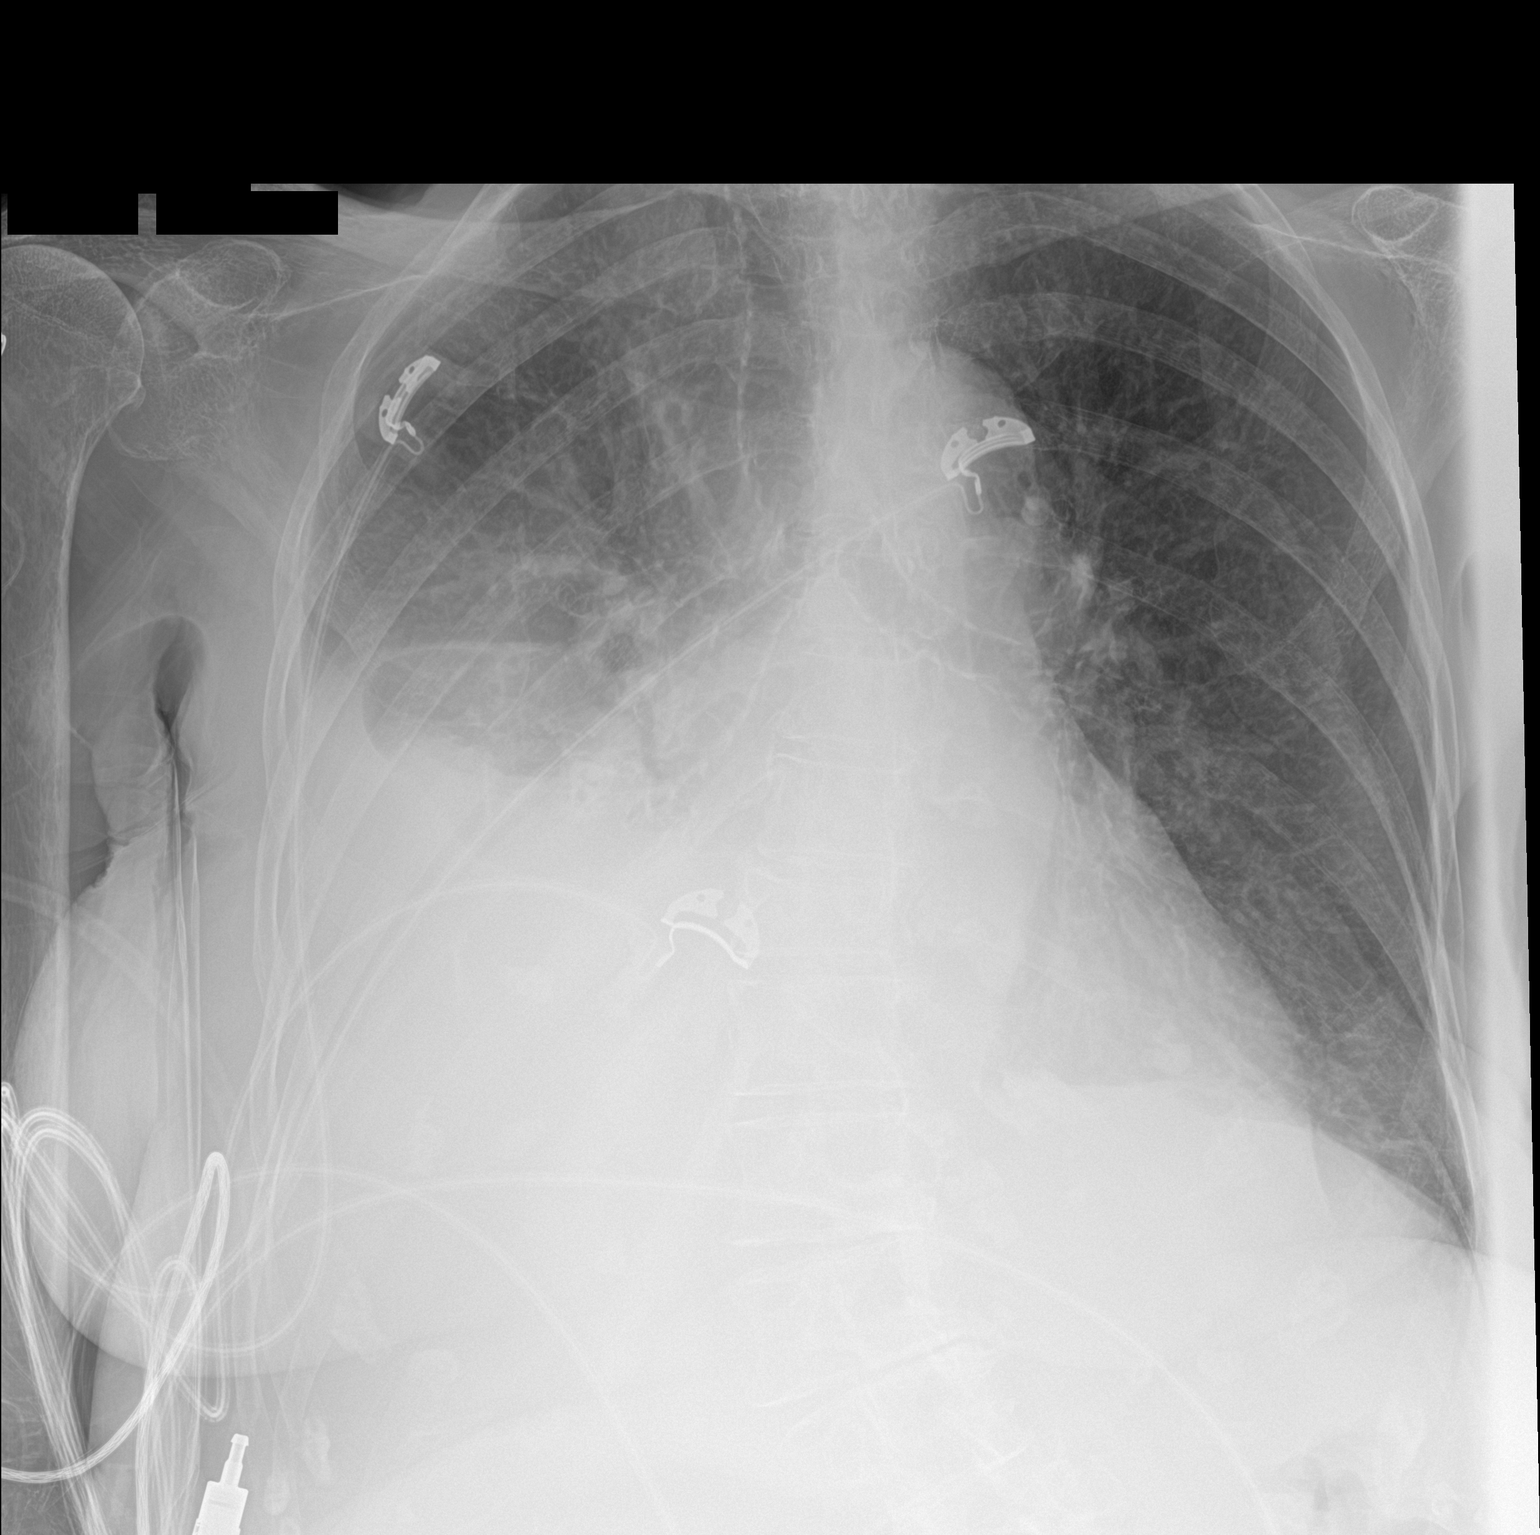

[1 of 1 positions shown; findings below may reference images not displayed]

FINDINGS: Stable cardiomediastinal silhouette. No pneumothorax is noted.
Interval development of moderate right pleural effusion with
probable underlying atelectasis or infiltrate. Minimal left basilar
subsegmental atelectasis is noted. Bony thorax is unremarkable.
IMPRESSION: Interval development of moderate right pleural effusion with
probable underlying atelectasis or infiltrate.
